# Patient Record
Sex: Female | Born: 1945
Health system: Southern US, Community
[De-identification: ages and names within clinical notes are randomized; demographics above are authoritative.]

## PROBLEM LIST (undated history)

## (undated) DIAGNOSIS — J45909 Unspecified asthma, uncomplicated: Secondary | ICD-10-CM

## (undated) DIAGNOSIS — I21A1 Myocardial infarction type 2: Secondary | ICD-10-CM

## (undated) DIAGNOSIS — I2510 Atherosclerotic heart disease of native coronary artery without angina pectoris: Secondary | ICD-10-CM

## (undated) DIAGNOSIS — I4719 Other supraventricular tachycardia: Secondary | ICD-10-CM

## (undated) DIAGNOSIS — I455 Other specified heart block: Secondary | ICD-10-CM

## (undated) DIAGNOSIS — I493 Ventricular premature depolarization: Secondary | ICD-10-CM

## (undated) DIAGNOSIS — E785 Hyperlipidemia, unspecified: Secondary | ICD-10-CM

## (undated) DIAGNOSIS — I10 Essential (primary) hypertension: Secondary | ICD-10-CM

## (undated) DIAGNOSIS — I272 Pulmonary hypertension, unspecified: Secondary | ICD-10-CM

## (undated) HISTORY — DX: Ventricular premature depolarization: I49.3

## (undated) HISTORY — PX: ELBOW FRACTURE SURGERY: SHX616

## (undated) HISTORY — DX: Other specified heart block: I45.5

## (undated) HISTORY — DX: Other supraventricular tachycardia: I47.19

## (undated) HISTORY — PX: KNEE ARTHROSCOPY: SUR90

## (undated) HISTORY — DX: Essential (primary) hypertension: I10

## (undated) HISTORY — DX: Pulmonary hypertension, unspecified: I27.20

## (undated) HISTORY — DX: Atherosclerotic heart disease of native coronary artery without angina pectoris: I25.10

## (undated) HISTORY — DX: Myocardial infarction type 2: I21.A1

## (undated) HISTORY — DX: Hyperlipidemia, unspecified: E78.5

---

## 1986-11-28 HISTORY — PX: ABDOMINAL HYSTERECTOMY: SHX81

## 2002-01-28 ENCOUNTER — Encounter: Payer: Self-pay | Admitting: Family Medicine

## 2002-01-28 ENCOUNTER — Ambulatory Visit (HOSPITAL_COMMUNITY): Admission: RE | Admit: 2002-01-28 | Discharge: 2002-01-28 | Payer: Self-pay | Admitting: Family Medicine

## 2002-02-27 ENCOUNTER — Encounter: Payer: Self-pay | Admitting: Family Medicine

## 2002-02-27 ENCOUNTER — Ambulatory Visit (HOSPITAL_COMMUNITY): Admission: RE | Admit: 2002-02-27 | Discharge: 2002-02-27 | Payer: Self-pay | Admitting: Family Medicine

## 2002-07-15 ENCOUNTER — Ambulatory Visit (HOSPITAL_COMMUNITY): Admission: RE | Admit: 2002-07-15 | Discharge: 2002-07-15 | Payer: Self-pay | Admitting: Family Medicine

## 2002-07-15 ENCOUNTER — Encounter: Payer: Self-pay | Admitting: Family Medicine

## 2002-09-09 ENCOUNTER — Ambulatory Visit (HOSPITAL_COMMUNITY): Admission: RE | Admit: 2002-09-09 | Discharge: 2002-09-09 | Payer: Self-pay | Admitting: Urology

## 2002-09-09 ENCOUNTER — Encounter: Payer: Self-pay | Admitting: Urology

## 2002-10-25 ENCOUNTER — Encounter: Payer: Self-pay | Admitting: Family Medicine

## 2002-10-25 ENCOUNTER — Ambulatory Visit (HOSPITAL_COMMUNITY): Admission: RE | Admit: 2002-10-25 | Discharge: 2002-10-25 | Payer: Self-pay | Admitting: Family Medicine

## 2003-02-28 ENCOUNTER — Encounter: Payer: Self-pay | Admitting: Family Medicine

## 2003-02-28 ENCOUNTER — Ambulatory Visit (HOSPITAL_COMMUNITY): Admission: RE | Admit: 2003-02-28 | Discharge: 2003-02-28 | Payer: Self-pay | Admitting: Family Medicine

## 2003-08-22 ENCOUNTER — Encounter: Payer: Self-pay | Admitting: Orthopaedic Surgery

## 2003-08-22 ENCOUNTER — Ambulatory Visit (HOSPITAL_COMMUNITY): Admission: RE | Admit: 2003-08-22 | Discharge: 2003-08-22 | Payer: Self-pay | Admitting: Orthopaedic Surgery

## 2004-03-01 ENCOUNTER — Ambulatory Visit (HOSPITAL_COMMUNITY): Admission: RE | Admit: 2004-03-01 | Discharge: 2004-03-01 | Payer: Self-pay | Admitting: Family Medicine

## 2005-03-14 ENCOUNTER — Ambulatory Visit (HOSPITAL_COMMUNITY): Admission: RE | Admit: 2005-03-14 | Discharge: 2005-03-14 | Payer: Self-pay | Admitting: Family Medicine

## 2005-03-24 ENCOUNTER — Ambulatory Visit (HOSPITAL_COMMUNITY): Admission: RE | Admit: 2005-03-24 | Discharge: 2005-03-24 | Payer: Self-pay | Admitting: Family Medicine

## 2005-08-09 ENCOUNTER — Ambulatory Visit (HOSPITAL_COMMUNITY): Admission: RE | Admit: 2005-08-09 | Discharge: 2005-08-09 | Payer: Self-pay | Admitting: Family Medicine

## 2006-03-27 ENCOUNTER — Ambulatory Visit (HOSPITAL_COMMUNITY): Admission: RE | Admit: 2006-03-27 | Discharge: 2006-03-27 | Payer: Self-pay | Admitting: Family Medicine

## 2006-04-26 ENCOUNTER — Ambulatory Visit: Payer: Self-pay | Admitting: Internal Medicine

## 2006-04-26 ENCOUNTER — Ambulatory Visit (HOSPITAL_COMMUNITY): Admission: RE | Admit: 2006-04-26 | Discharge: 2006-04-26 | Payer: Self-pay | Admitting: Internal Medicine

## 2006-06-29 ENCOUNTER — Ambulatory Visit (HOSPITAL_COMMUNITY): Admission: RE | Admit: 2006-06-29 | Discharge: 2006-06-29 | Payer: Self-pay | Admitting: Internal Medicine

## 2007-04-09 ENCOUNTER — Ambulatory Visit (HOSPITAL_COMMUNITY): Admission: RE | Admit: 2007-04-09 | Discharge: 2007-04-09 | Payer: Self-pay | Admitting: Internal Medicine

## 2008-04-15 ENCOUNTER — Ambulatory Visit (HOSPITAL_COMMUNITY): Admission: RE | Admit: 2008-04-15 | Discharge: 2008-04-15 | Payer: Self-pay | Admitting: Internal Medicine

## 2008-05-05 ENCOUNTER — Ambulatory Visit (HOSPITAL_COMMUNITY): Admission: RE | Admit: 2008-05-05 | Discharge: 2008-05-05 | Payer: Self-pay | Admitting: Internal Medicine

## 2008-07-21 ENCOUNTER — Encounter (HOSPITAL_COMMUNITY): Admission: RE | Admit: 2008-07-21 | Discharge: 2008-08-20 | Payer: Self-pay | Admitting: Oncology

## 2008-07-21 ENCOUNTER — Ambulatory Visit (HOSPITAL_COMMUNITY): Payer: Self-pay | Admitting: Internal Medicine

## 2009-09-29 ENCOUNTER — Encounter (HOSPITAL_COMMUNITY): Admission: RE | Admit: 2009-09-29 | Discharge: 2009-10-29 | Payer: Self-pay | Admitting: Internal Medicine

## 2009-09-29 ENCOUNTER — Ambulatory Visit (HOSPITAL_COMMUNITY): Payer: Self-pay | Admitting: Internal Medicine

## 2010-05-10 ENCOUNTER — Ambulatory Visit (HOSPITAL_COMMUNITY): Admission: RE | Admit: 2010-05-10 | Discharge: 2010-05-10 | Payer: Self-pay | Admitting: Internal Medicine

## 2010-06-22 ENCOUNTER — Ambulatory Visit (HOSPITAL_COMMUNITY): Admission: RE | Admit: 2010-06-22 | Discharge: 2010-06-22 | Payer: Self-pay | Admitting: Internal Medicine

## 2010-10-07 ENCOUNTER — Encounter (HOSPITAL_COMMUNITY)
Admission: RE | Admit: 2010-10-07 | Discharge: 2010-11-06 | Payer: Self-pay | Source: Home / Self Care | Attending: Internal Medicine | Admitting: Internal Medicine

## 2010-10-07 ENCOUNTER — Ambulatory Visit (HOSPITAL_COMMUNITY): Payer: Self-pay | Admitting: Internal Medicine

## 2011-03-07 ENCOUNTER — Ambulatory Visit (INDEPENDENT_AMBULATORY_CARE_PROVIDER_SITE_OTHER): Payer: Self-pay | Admitting: Internal Medicine

## 2011-04-15 NOTE — Op Note (Signed)
NAMEHIILANI, Tammy [name]                ACCOUNT NO.:  [id number]   MEDICAL RECORD NO.:  [id number]          PATIENT TYPE:  AMB   LOCATION:  DAY                           FACILITY:  [facility]   PHYSICIAN:  Lionel December, M.D.    DATE OF BIRTH:  [birth year]   DATE OF PROCEDURE:  04/26/2006  DATE OF DISCHARGE:                                 OPERATIVE REPORT   PROCEDURE:  Colonoscopy.   INDICATIONS:  Tammy [name] is a 65 year old African-American female who is  undergoing diagnostic colonoscopy.  She has intermittent hematochezia and  bloating.  She also has family history of colon carcinoma (maternal  grandmother in her late 83s or 27s).  Her last colonoscopy was in 2000.  Procedure risks were reviewed with the patient, and informed consent was  obtained.   MEDS FOR CONSCIOUS SEDATION:  Demerol 50 mg IV, Versed 8 mg IV.   FINDINGS:  Procedure performed in endoscopy suite.  The patient's vital  signs and O2 sat were monitored during procedure and remained stable.  The  patient was placed in the left lateral position.  Rectal examination  performed.  No abnormality noted on external or digital exam.  Olympus video-  scope was placed in the rectum and advanced under vision into the sigmoid  colon, a very tortuous sigmoid colon with a tight loops.  Slowly and  carefully scope was passed proximally and finally into the descending colon  and splenic flexure.  Further intubation to cecum was easy.  She had few  scattered diverticulum in the sigmoid and descending colon.  Overall  preparation was satisfactory.  Scope was passed through cecum which was  identified by a lipomatous ileocecal valve and appendiceal orifice/stump.  Pictures were taken for the record.  As the scope was withdrawn, colonic  mucosa was once again carefully examined.  There were no polyps or tumor  masses.  Rectal mucosa was normal.  Scope was retroflexed to examine  anorectal junction, and small hemorrhoids were noted below the dentate  line.  Endoscope was straightened and withdrawn.  The patient tolerated the  procedure well.   FINAL DIAGNOSIS:  1.  Few scattered diverticula at sigmoid and descending.  2.  External hemorrhoids felt to be source of intermittent hematochezia.   RECOMMENDATIONS:  1.  High-fiber diet.  2.  Yearly Hemoccults.  3.  She may consider next exam in 10 years from now.      Lionel December, M.D.  Electronically Signed     NR/MEDQ  D:  04/26/2006  T:  04/26/2006  Job:  [id number]   cc:   Angus G. Renard Matter, MD  Fax: [phone]

## 2011-07-04 ENCOUNTER — Other Ambulatory Visit (HOSPITAL_COMMUNITY): Payer: Self-pay | Admitting: Internal Medicine

## 2011-07-04 DIAGNOSIS — Z139 Encounter for screening, unspecified: Secondary | ICD-10-CM

## 2011-07-12 ENCOUNTER — Ambulatory Visit (HOSPITAL_COMMUNITY)
Admission: RE | Admit: 2011-07-12 | Discharge: 2011-07-12 | Disposition: A | Discharge status: Home / Self Care | Payer: Medicare Other | Source: Ambulatory Visit | Attending: Internal Medicine | Admitting: Internal Medicine

## 2011-07-12 DIAGNOSIS — Z1231 Encounter for screening mammogram for malignant neoplasm of breast: Secondary | ICD-10-CM | POA: Insufficient documentation

## 2011-07-12 DIAGNOSIS — Z139 Encounter for screening, unspecified: Secondary | ICD-10-CM

## 2012-03-22 DIAGNOSIS — I10 Essential (primary) hypertension: Secondary | ICD-10-CM | POA: Diagnosis not present

## 2012-05-01 DIAGNOSIS — I10 Essential (primary) hypertension: Secondary | ICD-10-CM | POA: Diagnosis not present

## 2012-07-23 ENCOUNTER — Other Ambulatory Visit (HOSPITAL_COMMUNITY): Payer: Self-pay | Admitting: Internal Medicine

## 2012-07-23 DIAGNOSIS — IMO0001 Reserved for inherently not codable concepts without codable children: Secondary | ICD-10-CM

## 2012-07-26 ENCOUNTER — Ambulatory Visit (HOSPITAL_COMMUNITY)
Admission: RE | Admit: 2012-07-26 | Discharge: 2012-07-26 | Disposition: A | Discharge status: Home / Self Care | Payer: Medicare Other | Source: Ambulatory Visit | Attending: Internal Medicine | Admitting: Internal Medicine

## 2012-07-26 DIAGNOSIS — Z1231 Encounter for screening mammogram for malignant neoplasm of breast: Secondary | ICD-10-CM | POA: Diagnosis not present

## 2012-07-26 DIAGNOSIS — IMO0001 Reserved for inherently not codable concepts without codable children: Secondary | ICD-10-CM

## 2012-08-03 DIAGNOSIS — M810 Age-related osteoporosis without current pathological fracture: Secondary | ICD-10-CM | POA: Diagnosis not present

## 2012-08-03 DIAGNOSIS — E785 Hyperlipidemia, unspecified: Secondary | ICD-10-CM | POA: Diagnosis not present

## 2012-08-03 DIAGNOSIS — J069 Acute upper respiratory infection, unspecified: Secondary | ICD-10-CM | POA: Diagnosis not present

## 2012-08-03 DIAGNOSIS — I10 Essential (primary) hypertension: Secondary | ICD-10-CM | POA: Diagnosis not present

## 2012-08-03 DIAGNOSIS — E782 Mixed hyperlipidemia: Secondary | ICD-10-CM | POA: Diagnosis not present

## 2012-08-13 ENCOUNTER — Other Ambulatory Visit (HOSPITAL_COMMUNITY): Payer: Self-pay | Admitting: Internal Medicine

## 2012-08-13 DIAGNOSIS — M810 Age-related osteoporosis without current pathological fracture: Secondary | ICD-10-CM

## 2012-08-17 ENCOUNTER — Ambulatory Visit (HOSPITAL_COMMUNITY)
Admission: RE | Admit: 2012-08-17 | Discharge: 2012-08-17 | Disposition: A | Discharge status: Home / Self Care | Payer: Medicare Other | Source: Ambulatory Visit | Attending: Internal Medicine | Admitting: Internal Medicine

## 2012-08-17 DIAGNOSIS — M818 Other osteoporosis without current pathological fracture: Secondary | ICD-10-CM | POA: Insufficient documentation

## 2012-08-17 DIAGNOSIS — M810 Age-related osteoporosis without current pathological fracture: Secondary | ICD-10-CM

## 2012-08-17 DIAGNOSIS — Z13820 Encounter for screening for osteoporosis: Secondary | ICD-10-CM | POA: Diagnosis not present

## 2012-08-28 DIAGNOSIS — Z23 Encounter for immunization: Secondary | ICD-10-CM | POA: Diagnosis not present

## 2012-10-22 DIAGNOSIS — H524 Presbyopia: Secondary | ICD-10-CM | POA: Diagnosis not present

## 2012-10-22 DIAGNOSIS — H40059 Ocular hypertension, unspecified eye: Secondary | ICD-10-CM | POA: Diagnosis not present

## 2012-10-22 DIAGNOSIS — H52229 Regular astigmatism, unspecified eye: Secondary | ICD-10-CM | POA: Diagnosis not present

## 2012-10-22 DIAGNOSIS — H5200 Hypermetropia, unspecified eye: Secondary | ICD-10-CM | POA: Diagnosis not present

## 2013-01-16 DIAGNOSIS — M729 Fibroblastic disorder, unspecified: Secondary | ICD-10-CM | POA: Diagnosis not present

## 2013-03-04 DIAGNOSIS — I10 Essential (primary) hypertension: Secondary | ICD-10-CM | POA: Diagnosis not present

## 2013-03-04 DIAGNOSIS — E785 Hyperlipidemia, unspecified: Secondary | ICD-10-CM | POA: Diagnosis not present

## 2013-03-04 DIAGNOSIS — E782 Mixed hyperlipidemia: Secondary | ICD-10-CM | POA: Diagnosis not present

## 2013-04-16 DIAGNOSIS — M25569 Pain in unspecified knee: Secondary | ICD-10-CM | POA: Diagnosis not present

## 2013-06-03 DIAGNOSIS — M549 Dorsalgia, unspecified: Secondary | ICD-10-CM | POA: Diagnosis not present

## 2013-06-03 DIAGNOSIS — F411 Generalized anxiety disorder: Secondary | ICD-10-CM | POA: Diagnosis not present

## 2013-07-18 DIAGNOSIS — S61209A Unspecified open wound of unspecified finger without damage to nail, initial encounter: Secondary | ICD-10-CM | POA: Diagnosis not present

## 2013-07-31 DIAGNOSIS — Z23 Encounter for immunization: Secondary | ICD-10-CM | POA: Diagnosis not present

## 2013-07-31 DIAGNOSIS — G589 Mononeuropathy, unspecified: Secondary | ICD-10-CM | POA: Diagnosis not present

## 2013-08-29 DIAGNOSIS — I10 Essential (primary) hypertension: Secondary | ICD-10-CM | POA: Diagnosis not present

## 2013-08-29 DIAGNOSIS — E782 Mixed hyperlipidemia: Secondary | ICD-10-CM | POA: Diagnosis not present

## 2013-09-03 ENCOUNTER — Other Ambulatory Visit (HOSPITAL_COMMUNITY): Payer: Self-pay | Admitting: Internal Medicine

## 2013-09-03 DIAGNOSIS — M79609 Pain in unspecified limb: Secondary | ICD-10-CM | POA: Diagnosis not present

## 2013-09-03 DIAGNOSIS — M79604 Pain in right leg: Secondary | ICD-10-CM

## 2013-09-03 DIAGNOSIS — G589 Mononeuropathy, unspecified: Secondary | ICD-10-CM | POA: Diagnosis not present

## 2013-09-03 DIAGNOSIS — E782 Mixed hyperlipidemia: Secondary | ICD-10-CM | POA: Diagnosis not present

## 2013-09-03 DIAGNOSIS — I10 Essential (primary) hypertension: Secondary | ICD-10-CM | POA: Diagnosis not present

## 2013-09-03 DIAGNOSIS — R200 Anesthesia of skin: Secondary | ICD-10-CM

## 2013-09-05 ENCOUNTER — Ambulatory Visit (HOSPITAL_COMMUNITY)
Admission: RE | Admit: 2013-09-05 | Discharge: 2013-09-05 | Disposition: A | Discharge status: Home / Self Care | Payer: Medicare Other | Source: Ambulatory Visit | Attending: Internal Medicine | Admitting: Internal Medicine

## 2013-09-05 ENCOUNTER — Encounter (HOSPITAL_COMMUNITY): Payer: Self-pay

## 2013-09-05 DIAGNOSIS — M51379 Other intervertebral disc degeneration, lumbosacral region without mention of lumbar back pain or lower extremity pain: Secondary | ICD-10-CM | POA: Insufficient documentation

## 2013-09-05 DIAGNOSIS — M47817 Spondylosis without myelopathy or radiculopathy, lumbosacral region: Secondary | ICD-10-CM | POA: Diagnosis not present

## 2013-09-05 DIAGNOSIS — M5126 Other intervertebral disc displacement, lumbar region: Secondary | ICD-10-CM | POA: Diagnosis not present

## 2013-09-05 DIAGNOSIS — M79604 Pain in right leg: Secondary | ICD-10-CM

## 2013-09-05 DIAGNOSIS — M129 Arthropathy, unspecified: Secondary | ICD-10-CM | POA: Insufficient documentation

## 2013-09-05 DIAGNOSIS — R209 Unspecified disturbances of skin sensation: Secondary | ICD-10-CM | POA: Diagnosis not present

## 2013-09-05 DIAGNOSIS — M4310 Spondylolisthesis, site unspecified: Secondary | ICD-10-CM | POA: Diagnosis not present

## 2013-09-05 DIAGNOSIS — M5137 Other intervertebral disc degeneration, lumbosacral region: Secondary | ICD-10-CM | POA: Insufficient documentation

## 2013-09-05 DIAGNOSIS — R200 Anesthesia of skin: Secondary | ICD-10-CM

## 2013-09-11 ENCOUNTER — Other Ambulatory Visit (HOSPITAL_COMMUNITY): Payer: Self-pay | Admitting: Internal Medicine

## 2013-09-11 DIAGNOSIS — Z139 Encounter for screening, unspecified: Secondary | ICD-10-CM

## 2013-09-19 ENCOUNTER — Ambulatory Visit (HOSPITAL_COMMUNITY): Payer: Medicare Other

## 2013-09-19 ENCOUNTER — Ambulatory Visit (HOSPITAL_COMMUNITY)
Admission: RE | Admit: 2013-09-19 | Discharge: 2013-09-19 | Disposition: A | Discharge status: Home / Self Care | Payer: Medicare Other | Source: Ambulatory Visit | Attending: Internal Medicine | Admitting: Internal Medicine

## 2013-09-19 DIAGNOSIS — Z139 Encounter for screening, unspecified: Secondary | ICD-10-CM

## 2013-09-19 DIAGNOSIS — Z1231 Encounter for screening mammogram for malignant neoplasm of breast: Secondary | ICD-10-CM | POA: Insufficient documentation

## 2013-09-24 ENCOUNTER — Other Ambulatory Visit: Payer: Self-pay | Admitting: Internal Medicine

## 2013-09-24 DIAGNOSIS — R928 Other abnormal and inconclusive findings on diagnostic imaging of breast: Secondary | ICD-10-CM

## 2013-10-16 ENCOUNTER — Ambulatory Visit (HOSPITAL_COMMUNITY)
Admission: RE | Admit: 2013-10-16 | Discharge: 2013-10-16 | Disposition: A | Discharge status: Home / Self Care | Payer: Medicare Other | Source: Ambulatory Visit | Attending: Internal Medicine | Admitting: Internal Medicine

## 2013-10-16 ENCOUNTER — Other Ambulatory Visit: Payer: Self-pay | Admitting: Internal Medicine

## 2013-10-16 DIAGNOSIS — R928 Other abnormal and inconclusive findings on diagnostic imaging of breast: Secondary | ICD-10-CM

## 2013-10-16 DIAGNOSIS — N63 Unspecified lump in unspecified breast: Secondary | ICD-10-CM | POA: Diagnosis not present

## 2014-03-06 DIAGNOSIS — R7301 Impaired fasting glucose: Secondary | ICD-10-CM | POA: Diagnosis not present

## 2014-03-06 DIAGNOSIS — I10 Essential (primary) hypertension: Secondary | ICD-10-CM | POA: Diagnosis not present

## 2014-03-10 ENCOUNTER — Other Ambulatory Visit (HOSPITAL_COMMUNITY): Payer: Self-pay | Admitting: Internal Medicine

## 2014-03-10 DIAGNOSIS — R928 Other abnormal and inconclusive findings on diagnostic imaging of breast: Secondary | ICD-10-CM

## 2014-03-11 DIAGNOSIS — I10 Essential (primary) hypertension: Secondary | ICD-10-CM | POA: Diagnosis not present

## 2014-03-11 DIAGNOSIS — E782 Mixed hyperlipidemia: Secondary | ICD-10-CM | POA: Diagnosis not present

## 2014-03-11 DIAGNOSIS — G589 Mononeuropathy, unspecified: Secondary | ICD-10-CM | POA: Diagnosis not present

## 2014-03-11 DIAGNOSIS — R7301 Impaired fasting glucose: Secondary | ICD-10-CM | POA: Diagnosis not present

## 2014-04-16 ENCOUNTER — Ambulatory Visit (HOSPITAL_COMMUNITY): Payer: Medicare Other

## 2014-04-23 ENCOUNTER — Ambulatory Visit (HOSPITAL_COMMUNITY): Payer: Medicare Other

## 2014-04-30 ENCOUNTER — Ambulatory Visit (HOSPITAL_COMMUNITY)
Admission: RE | Admit: 2014-04-30 | Discharge: 2014-04-30 | Disposition: A | Discharge status: Home / Self Care | Payer: Medicare Other | Source: Ambulatory Visit | Attending: Internal Medicine | Admitting: Internal Medicine

## 2014-04-30 DIAGNOSIS — R928 Other abnormal and inconclusive findings on diagnostic imaging of breast: Secondary | ICD-10-CM | POA: Diagnosis not present

## 2014-04-30 DIAGNOSIS — N63 Unspecified lump in unspecified breast: Secondary | ICD-10-CM | POA: Insufficient documentation

## 2014-06-18 DIAGNOSIS — H2510 Age-related nuclear cataract, unspecified eye: Secondary | ICD-10-CM | POA: Diagnosis not present

## 2014-06-18 DIAGNOSIS — H52229 Regular astigmatism, unspecified eye: Secondary | ICD-10-CM | POA: Diagnosis not present

## 2014-06-18 DIAGNOSIS — H40009 Preglaucoma, unspecified, unspecified eye: Secondary | ICD-10-CM | POA: Diagnosis not present

## 2014-06-18 DIAGNOSIS — H5200 Hypermetropia, unspecified eye: Secondary | ICD-10-CM | POA: Diagnosis not present

## 2014-07-30 DIAGNOSIS — H40009 Preglaucoma, unspecified, unspecified eye: Secondary | ICD-10-CM | POA: Diagnosis not present

## 2014-08-19 DIAGNOSIS — Z23 Encounter for immunization: Secondary | ICD-10-CM | POA: Diagnosis not present

## 2014-09-03 DIAGNOSIS — Z0000 Encounter for general adult medical examination without abnormal findings: Secondary | ICD-10-CM | POA: Diagnosis not present

## 2014-09-03 DIAGNOSIS — Z23 Encounter for immunization: Secondary | ICD-10-CM | POA: Diagnosis not present

## 2014-10-08 ENCOUNTER — Other Ambulatory Visit (HOSPITAL_COMMUNITY): Payer: Self-pay | Admitting: Internal Medicine

## 2014-10-08 DIAGNOSIS — N6310 Unspecified lump in the right breast, unspecified quadrant: Secondary | ICD-10-CM

## 2014-11-04 ENCOUNTER — Encounter (HOSPITAL_COMMUNITY): Payer: Medicare Other

## 2014-11-27 DIAGNOSIS — R21 Rash and other nonspecific skin eruption: Secondary | ICD-10-CM | POA: Diagnosis not present

## 2014-12-27 DIAGNOSIS — Z0000 Encounter for general adult medical examination without abnormal findings: Secondary | ICD-10-CM | POA: Diagnosis not present

## 2014-12-27 DIAGNOSIS — R21 Rash and other nonspecific skin eruption: Secondary | ICD-10-CM | POA: Diagnosis not present

## 2014-12-27 DIAGNOSIS — G4700 Insomnia, unspecified: Secondary | ICD-10-CM | POA: Diagnosis not present

## 2015-01-08 DIAGNOSIS — L500 Allergic urticaria: Secondary | ICD-10-CM | POA: Diagnosis not present

## 2015-01-08 DIAGNOSIS — L309 Dermatitis, unspecified: Secondary | ICD-10-CM | POA: Diagnosis not present

## 2015-01-15 DIAGNOSIS — L309 Dermatitis, unspecified: Secondary | ICD-10-CM | POA: Diagnosis not present

## 2015-02-06 DIAGNOSIS — Z6829 Body mass index (BMI) 29.0-29.9, adult: Secondary | ICD-10-CM | POA: Diagnosis not present

## 2015-02-06 DIAGNOSIS — Z0000 Encounter for general adult medical examination without abnormal findings: Secondary | ICD-10-CM | POA: Diagnosis not present

## 2015-02-09 DIAGNOSIS — L309 Dermatitis, unspecified: Secondary | ICD-10-CM | POA: Diagnosis not present

## 2015-05-26 DIAGNOSIS — R42 Dizziness and giddiness: Secondary | ICD-10-CM | POA: Diagnosis not present

## 2015-05-27 DIAGNOSIS — R7301 Impaired fasting glucose: Secondary | ICD-10-CM | POA: Diagnosis not present

## 2015-05-27 DIAGNOSIS — E785 Hyperlipidemia, unspecified: Secondary | ICD-10-CM | POA: Diagnosis not present

## 2015-05-27 DIAGNOSIS — I10 Essential (primary) hypertension: Secondary | ICD-10-CM | POA: Diagnosis not present

## 2015-05-27 DIAGNOSIS — R5383 Other fatigue: Secondary | ICD-10-CM | POA: Diagnosis not present

## 2015-05-27 DIAGNOSIS — R42 Dizziness and giddiness: Secondary | ICD-10-CM | POA: Diagnosis not present

## 2015-06-05 DIAGNOSIS — J309 Allergic rhinitis, unspecified: Secondary | ICD-10-CM | POA: Diagnosis not present

## 2015-06-05 DIAGNOSIS — I10 Essential (primary) hypertension: Secondary | ICD-10-CM | POA: Diagnosis not present

## 2015-06-05 DIAGNOSIS — E785 Hyperlipidemia, unspecified: Secondary | ICD-10-CM | POA: Diagnosis not present

## 2015-06-05 DIAGNOSIS — R7301 Impaired fasting glucose: Secondary | ICD-10-CM | POA: Diagnosis not present

## 2015-09-08 DIAGNOSIS — I10 Essential (primary) hypertension: Secondary | ICD-10-CM | POA: Diagnosis not present

## 2015-09-08 DIAGNOSIS — E785 Hyperlipidemia, unspecified: Secondary | ICD-10-CM | POA: Diagnosis not present

## 2015-09-08 DIAGNOSIS — R7301 Impaired fasting glucose: Secondary | ICD-10-CM | POA: Diagnosis not present

## 2015-09-15 ENCOUNTER — Other Ambulatory Visit (HOSPITAL_COMMUNITY): Payer: Self-pay | Admitting: Internal Medicine

## 2015-09-15 DIAGNOSIS — R7301 Impaired fasting glucose: Secondary | ICD-10-CM | POA: Diagnosis not present

## 2015-09-15 DIAGNOSIS — E785 Hyperlipidemia, unspecified: Secondary | ICD-10-CM | POA: Diagnosis not present

## 2015-09-15 DIAGNOSIS — I10 Essential (primary) hypertension: Secondary | ICD-10-CM | POA: Diagnosis not present

## 2015-09-15 DIAGNOSIS — R946 Abnormal results of thyroid function studies: Secondary | ICD-10-CM | POA: Diagnosis not present

## 2015-09-15 DIAGNOSIS — Z23 Encounter for immunization: Secondary | ICD-10-CM | POA: Diagnosis not present

## 2015-09-15 DIAGNOSIS — N6310 Unspecified lump in the right breast, unspecified quadrant: Secondary | ICD-10-CM

## 2015-09-15 DIAGNOSIS — Z09 Encounter for follow-up examination after completed treatment for conditions other than malignant neoplasm: Secondary | ICD-10-CM

## 2015-09-29 ENCOUNTER — Ambulatory Visit (HOSPITAL_COMMUNITY)
Admission: RE | Admit: 2015-09-29 | Discharge: 2015-09-29 | Disposition: A | Discharge status: Home / Self Care | Payer: Medicare Other | Source: Ambulatory Visit | Attending: Internal Medicine | Admitting: Internal Medicine

## 2015-09-29 DIAGNOSIS — N6489 Other specified disorders of breast: Secondary | ICD-10-CM | POA: Diagnosis not present

## 2015-09-29 DIAGNOSIS — Z09 Encounter for follow-up examination after completed treatment for conditions other than malignant neoplasm: Secondary | ICD-10-CM

## 2015-09-29 DIAGNOSIS — N63 Unspecified lump in breast: Secondary | ICD-10-CM | POA: Insufficient documentation

## 2015-09-29 DIAGNOSIS — N6310 Unspecified lump in the right breast, unspecified quadrant: Secondary | ICD-10-CM

## 2015-12-22 DIAGNOSIS — R7301 Impaired fasting glucose: Secondary | ICD-10-CM | POA: Diagnosis not present

## 2015-12-22 DIAGNOSIS — E782 Mixed hyperlipidemia: Secondary | ICD-10-CM | POA: Diagnosis not present

## 2015-12-22 DIAGNOSIS — I10 Essential (primary) hypertension: Secondary | ICD-10-CM | POA: Diagnosis not present

## 2015-12-22 DIAGNOSIS — R946 Abnormal results of thyroid function studies: Secondary | ICD-10-CM | POA: Diagnosis not present

## 2015-12-25 DIAGNOSIS — E782 Mixed hyperlipidemia: Secondary | ICD-10-CM | POA: Diagnosis not present

## 2015-12-25 DIAGNOSIS — J309 Allergic rhinitis, unspecified: Secondary | ICD-10-CM | POA: Diagnosis not present

## 2015-12-25 DIAGNOSIS — I10 Essential (primary) hypertension: Secondary | ICD-10-CM | POA: Diagnosis not present

## 2015-12-25 DIAGNOSIS — R7301 Impaired fasting glucose: Secondary | ICD-10-CM | POA: Diagnosis not present

## 2016-02-04 DIAGNOSIS — S50861A Insect bite (nonvenomous) of right forearm, initial encounter: Secondary | ICD-10-CM | POA: Diagnosis not present

## 2016-04-12 ENCOUNTER — Encounter (INDEPENDENT_AMBULATORY_CARE_PROVIDER_SITE_OTHER): Payer: Self-pay | Admitting: *Deleted

## 2016-04-13 DIAGNOSIS — M25511 Pain in right shoulder: Secondary | ICD-10-CM | POA: Diagnosis not present

## 2016-04-19 ENCOUNTER — Ambulatory Visit (INDEPENDENT_AMBULATORY_CARE_PROVIDER_SITE_OTHER): Payer: Medicare Other

## 2016-04-19 ENCOUNTER — Ambulatory Visit (INDEPENDENT_AMBULATORY_CARE_PROVIDER_SITE_OTHER): Payer: Medicare Other | Admitting: Orthopaedic Surgery

## 2016-04-19 ENCOUNTER — Encounter: Payer: Self-pay | Admitting: Orthopaedic Surgery

## 2016-04-19 VITALS — BP 131/79 | HR 74 | Temp 97.5°F | Ht 65.0 in | Wt 185.0 lb

## 2016-04-19 DIAGNOSIS — M25511 Pain in right shoulder: Secondary | ICD-10-CM

## 2016-04-19 MED ORDER — HYDROCODONE-ACETAMINOPHEN 5-325 MG PO TABS: 1.0000 | ORAL_TABLET | ORAL | Status: DC | PRN

## 2016-04-19 NOTE — Progress Notes (Signed)
Subjective: My right shoulder hurts.    Patient ID: Tammy [name], female    DOB: [birth year], 70 y.o.   MRN: [id number]  Shoulder Pain  The pain is present in the right shoulder. This is a new problem. The current episode started more than 1 year ago. There has been no history of extremity trauma. The problem occurs daily. The problem has been gradually worsening. The quality of the pain is described as aching. The pain is at a severity of 4/10. The pain is moderate. The symptoms are aggravated by activity and cold. She has tried NSAIDS, rest, heat, cold and acetaminophen for the symptoms. The treatment provided mild relief.   She has a new grandson who lives in [place].  She went and stayed there for about three weeks.  She did a lot of lifting of the grandson and helping in the house.  She started to notice some pain in her right shoulder.  The pain got slowly worse.  She came back home and the pain continued and has gotten more painful.  She has pain in raising her right arm over her head.  She is not sleeping well.  She has no redness, no numbness, no neck pain, no other joint pain. She tried heat, ice, rubs and Advil with little help.  She saw Dr. Merlyn Albert and is referred here.   Review of Systems  HENT: Negative for congestion.   Respiratory: Negative for cough and shortness of breath.   Cardiovascular: Negative for chest pain and leg swelling.  Endocrine: Positive for cold intolerance.  Musculoskeletal: Positive for arthralgias.  Allergic/Immunologic: Positive for environmental allergies.   History reviewed. No pertinent past medical history.  History reviewed. No pertinent past surgical history.  No current outpatient prescriptions on file prior to visit.   No current facility-administered medications on file prior to visit.    Social History   Social History  . Marital Status: Widowed    Spouse Name: N/A  . Number of Children: N/A  . Years of Education: N/A    Occupational History  . Not on file.   Social History Main Topics  . Smoking status: Not on file  . Smokeless tobacco: Not on file  . Alcohol Use: Not on file  . Drug Use: Not on file  . Sexual Activity: Not on file   Other Topics Concern  . Not on file   Social History Narrative    BP 131/79 mmHg  Pulse 74  Temp(Src) 97.5 F (36.4 C)  Ht 5\' 5"  (1.651 m)  Wt 185 lb (83.915 kg)  BMI 30.79 kg/m2     Objective:   Physical Exam  Constitutional: She is oriented to person, place, and time. She appears well-developed and well-nourished.  HENT:  Head: Normocephalic and atraumatic.  Eyes: Conjunctivae and EOM are normal. Pupils are equal, round, and reactive to light.  Neck: Normal range of motion. Neck supple.  Cardiovascular: Normal rate, regular rhythm and intact distal pulses.   Pulmonary/Chest: Effort normal.  Abdominal: Soft.  Musculoskeletal: She exhibits tenderness (Pain of the right shoulder, adduction full, flexion 150, abduction 100, internal 20, external 25, extension 5, no effusion, no redness.  Left shoulder normal.).  Neurological: She is alert and oriented to person, place, and time. She displays normal reflexes. No cranial nerve deficit. She exhibits normal muscle tone. Coordination normal.  Skin: Skin is warm and dry.  Psychiatric: She has a normal mood and affect. Her behavior is normal. Judgment and  thought content normal.  Vitals reviewed.   X-rays were done of the right shoulder, reported separately.      Assessment & Plan:   Encounter Diagnosis  Name Primary?  . Right shoulder pain Yes   She has bursitis of the right shoulder.  PROCEDURE NOTE:  The patient request injection, verbal consent was obtained.  The right shoulder was prepped appropriately after time out was performed.   Sterile technique was observed and injection of 1 cc of Depo-Medrol 40 mg with several cc's of plain xylocaine. Anesthesia was provided by ethyl chloride and a  20-gauge needle was used to inject the shoulder area. A posterior approach was used.  The injection was tolerated well.  A band aid dressing was applied.  The patient was advised to apply ice later today and tomorrow to the injection sight as needed.  I have given pain medicine and told her to stop the Advil and begin Aleve one bid pc.  I will see her back in two weeks.  Call if any problem. Precautions given.

## 2016-04-19 NOTE — Patient Instructions (Signed)
Begin PT. 

## 2016-04-19 NOTE — Addendum Note (Signed)
Addended by: Moreen Fowler R on: 04/19/2016 05:04 PM   Modules accepted: Orders

## 2016-04-22 ENCOUNTER — Ambulatory Visit (HOSPITAL_COMMUNITY): Payer: Medicare Other | Attending: Orthopaedic Surgery | Admitting: Occupational Therapy

## 2016-04-22 DIAGNOSIS — R29898 Other symptoms and signs involving the musculoskeletal system: Secondary | ICD-10-CM | POA: Diagnosis not present

## 2016-04-22 DIAGNOSIS — M79601 Pain in right arm: Secondary | ICD-10-CM | POA: Diagnosis not present

## 2016-04-22 NOTE — Patient Instructions (Addendum)
Doorway Stretch  Place each hand opposite each other on the doorway. (You can change where you feel the stretch by moving arms higher or lower.) Step through with one foot and bend front knee until a stretch is felt and hold. Step through with the opposite foot on the next rep. Hold for _____ seconds. Repeat ____times.     Scapular Retraction (Standing)   With arms at sides, pinch shoulder blades together. Repeat ____ times per set. Do ____ sets per session. Do ____ sessions per day.  http://orth.exer.us/944   Copyright  VHI. All rights reserved.   Wall Flexion  Using a towel, slide your arm up the wall until a stretch is felt in your shoulder .

## 2016-04-22 NOTE — Therapy (Signed)
[facility] [address] [place], [place], [address] Phone: [phone]   Fax:  [phone]  Occupational Therapy Evaluation  Patient Details  Name: Tammy [name] MRN: [id number] Date of Birth: 20-Jan-1946 Referring Provider: Sanjuana Kava, MD  Encounter Date: 04/22/2016      OT End of Session - 04/22/16 1354    Visit Number 1   Number of Visits 17   Date for OT Re-Evaluation 06/17/16  mini reassessment 05/20/16   Authorization Type Medicare part A and B   OT Start Time [id number]   OT Stop Time 1350   OT Time Calculation (min) 45 min   Activity Tolerance Patient tolerated treatment well   Behavior During Therapy [facility] for tasks assessed/performed      No past medical history on file.  No past surgical history on file.  There were no vitals filed for this visit.      Subjective Assessment - 04/22/16 1315    Subjective  S: "I'm fair"   Pertinent History See epic for PMH   Special Tests FOTO: 31/100   Patient Stated Goals I want to get back to at least 95% and learn how to be able to get back to doing what I normally do   Currently in Pain? Yes   Pain Score 7    Pain Location Shoulder   Pain Orientation Right   Pain Descriptors / Indicators Aching   Pain Type Acute pain   Pain Radiating Towards Sometimes my hand feels tingly    Pain Onset More than a month ago   Pain Frequency Intermittent   Aggravating Factors  movement   Pain Relieving Factors rest   Effect of Pain on Daily Activities ADLs and IADLs   Multiple Pain Sites No           [facility] OT Assessment - 04/22/16 0001    Assessment   Diagnosis Right shoulder pain   Referring Provider Sanjuana Kava, MD   Onset Date 02/10/16   Prior Therapy none   Precautions   Precautions None   Restrictions   Weight Bearing Restrictions No   Balance Screen   Has the patient fallen in the past 6 months No   Home  Environment   Family/patient expects to be discharged to: Private  residence   Living Arrangements Alone   Prior Function   Level of Independence Independent   ADL   Eating/Feeding Independent   Grooming Independent   Lower Body Bathing Modified independent  increased time and mainly uses LUE   Upper Body Dressing Increased time  due to pain and weakness   Lower Body Dressing Modified independent  increased time and mainly using LUE   Toilet Tranfer Independent   Toileting - Clothing Manipulation Modified independent  mainly using LUE   Toileting -  Hygiene Modified Independent  mainly using LUE   Tub/Shower Transfer Modified independent  increased time, using grab bars   Written Expression   Dominant Hand Right   Vision - History   Baseline Vision Wears glasses all the time   Activity Tolerance   Activity Tolerance Tolerates 30 min activity with muliple rests   Cognition   Overall Cognitive Status Within Functional Limits for tasks assessed   Observation/Other Assessments   Observations WNL   Sensation   Light Touch Impaired by gross assessment   Hot/Cold Impaired by gross assessment   Proprioception Impaired by gross assessment   Coordination   Gross Motor Movements are Fluid  and Coordinated Yes   Fine Motor Movements are Fluid and Coordinated No   ROM / Strength   AROM / PROM / Strength AROM;PROM;Strength   AROM   Overall AROM  Within functional limits for tasks performed  tested in supine   Overall AROM Comments Pt with increased pain when completing AROM, but able to go to full range with RUE   PROM   Overall PROM  Within functional limits for tasks performed  tested in supine   Overall PROM Comments Pt with increased pain during PROM as well   Strength   Overall Strength Deficits   Strength Assessment Site Shoulder;Elbow;Forearm;Wrist   Right/Left Shoulder Right   Right Shoulder Flexion 3/5  increased pain   Right Shoulder ABduction 3/5  increased pain   Right Shoulder Internal Rotation 3+/5  little pain   Right  Shoulder External Rotation 3+/5  little pain   Right/Left Elbow Right   Right Elbow Flexion 4+/5   Right Elbow Extension 4+/5   Right/Left Forearm Right   Right Forearm Pronation 5/5   Right Forearm Supination 5/5   Right/Left Wrist Right   Right Wrist Flexion 5/5   Right Wrist Extension 5/5   Hand Function   Right Hand Gross Grasp Impaired   Right Hand Grip (lbs) 35   Left Hand Gross Grasp Functional   Left Hand Grip (lbs) 80   Functional Reaching Activities   Low Level mod difficulty   Mid Level min difficulty   High Level mod difficulty                         OT Education - 04/22/16 1353    Education provided Yes   Education Details RUE stretch HEP   Person(s) Educated Patient   Methods Explanation;Demonstration;Verbal cues;Handout   Comprehension Verbalized understanding;Need further instruction;Returned demonstration;Verbal cues required          OT Short Term Goals - 04/22/16 1404    OT SHORT TERM GOAL #1   Title Pt will be independent with initial RUE HEP (including fine and gross motor exercises)    Time 4   Period Weeks   Status New   OT SHORT TERM GOAL #2   Title Pt will increase gross grasp strength to 45lbs to increase overall functional strength and independence with ADLs    Baseline 35lbs   Time 4   Period Weeks   Status New   OT SHORT TERM GOAL #3   Title Pt's pain will be 5/10 or less in order to increase overall independence with self-care tasks and to increase patient's quality of life   Baseline 7/10 on eval   Time 4   Period Weeks   Status New   OT SHORT TERM GOAL #4   Title Patient's shoulder flexion strength will increase to 4/5 with little to no pain during testing    Baseline mod increased pain during testing; 3/5   Time 4   Period Weeks   Status New           OT Long Term Goals - 04/22/16 1408    OT LONG TERM GOAL #1   Title Patient will be independent with upgraded  HEP   Time 8   Period Weeks   Status  New   OT LONG TERM GOAL #2   Title Pt will increase gross grasp strength to at least 55lbs to increase her overall functional strength and independence during ADLs  Time 8   Period Weeks   Status New   OT LONG TERM GOAL #3   Title Patient's internal and external rotation strength will increase to 4+/5    Time 8   Period Weeks   Status New   OT LONG TERM GOAL #4   Title Patient's pain will be 2/10 or less in order to increase overall independence with self care tasks and increase patient's overall quality of life    Baseline 7/10 pain on eval    Time 8   Period Weeks   Status New   OT LONG TERM GOAL #5   Title Pt will be able to tolerate completing an IADL task for at least 20 minutes with no more than 2/10 pain and no rest breaks needed   Time 8   Period Weeks   Status New               Plan - 04/22/16 1356    Clinical Impression Statement A: Pt is a 70 yo female with diagnosis of right shoulder pain (M25.511); low complexity evaluation. Pt reports start of shoulder pain being in middle of March after playing with her grandson, although she is unsure this is the reason for the pain. Pt reports pain as being everyday and it impacts her quality of life and independence with ADLs and IADLs. Pt will benefit from skilled outpatient OT to focus on increasing functional strength, increasing overall independence & quality of life, and decreasing overall pain.    Rehab Potential Excellent   Clinical Impairments Affecting Rehab Potential none known at this time   OT Frequency 2x / week   OT Duration 8 weeks   OT Treatment/Interventions Self-care/ADL training;Moist Heat;Therapeutic exercise;Therapeutic exercises;Therapeutic activities;Patient/family education;Ultrasound;Manual Therapy;Passive range of motion   Plan P: Go over initial HEP and add to it. Start myofascial release, PROM, AAROM in supine.    OT Home Exercise Plan RUE AA stretch program    Recommended Other Services None at  this time   Consulted and Agree with Plan of Care Patient      Patient will benefit from skilled therapeutic intervention in order to improve the following deficits and impairments:  Decreased coordination, Decreased endurance, Decreased strength, Pain, Impaired UE functional use, Increased fascial restricitons, Impaired sensation, Impaired tone  Visit Diagnosis: Pain In Right Arm - Plan: Ot plan of care cert/re-cert  Other symptoms and signs involving the musculoskeletal system - Plan: Ot plan of care cert/re-cert      G-Codes - 0000000 1419    Functional Assessment Tool Used FOTO: 31/100   Functional Limitation Carrying, moving and handling objects   Carrying, Moving and Handling Objects Current Status [id number]) At least 60 percent but less than 80 percent impaired, limited or restricted   Carrying, Moving and Handling Objects Goal Status [id number]) At least 20 percent but less than 40 percent impaired, limited or restricted      Problem List There are no active problems to display for this patient.   Chrys Racer , MS, OTR/L, CLT  04/22/2016, 2:22 PM  [facility] [place], [place], [address] Phone: [phone]   Fax:  [phone]  Name: KIRBI SIRMON MRN: [id number] Date of Birth: [birth year]

## 2016-04-26 ENCOUNTER — Encounter (HOSPITAL_COMMUNITY): Payer: Medicare Other | Admitting: Occupational Therapy

## 2016-04-28 ENCOUNTER — Other Ambulatory Visit (INDEPENDENT_AMBULATORY_CARE_PROVIDER_SITE_OTHER): Payer: Self-pay | Admitting: *Deleted

## 2016-04-28 DIAGNOSIS — Z800 Family history of malignant neoplasm of digestive organs: Secondary | ICD-10-CM

## 2016-04-28 DIAGNOSIS — Z1211 Encounter for screening for malignant neoplasm of colon: Secondary | ICD-10-CM

## 2016-04-29 ENCOUNTER — Telehealth (HOSPITAL_COMMUNITY): Payer: Self-pay

## 2016-04-29 ENCOUNTER — Encounter (HOSPITAL_COMMUNITY): Payer: Medicare Other | Admitting: Occupational Therapy

## 2016-04-29 NOTE — Telephone Encounter (Signed)
Patient not feeling well and her power went out.

## 2016-05-03 ENCOUNTER — Encounter (HOSPITAL_COMMUNITY): Payer: Self-pay

## 2016-05-03 ENCOUNTER — Ambulatory Visit (INDEPENDENT_AMBULATORY_CARE_PROVIDER_SITE_OTHER): Payer: Medicare Other | Admitting: Orthopaedic Surgery

## 2016-05-03 ENCOUNTER — Encounter: Payer: Self-pay | Admitting: Orthopaedic Surgery

## 2016-05-03 ENCOUNTER — Ambulatory Visit (HOSPITAL_COMMUNITY): Payer: Medicare Other | Attending: Orthopaedic Surgery

## 2016-05-03 VITALS — BP 116/70 | HR 70 | Temp 97.7°F | Ht 63.0 in | Wt 184.8 lb

## 2016-05-03 DIAGNOSIS — M25611 Stiffness of right shoulder, not elsewhere classified: Secondary | ICD-10-CM | POA: Insufficient documentation

## 2016-05-03 DIAGNOSIS — R29898 Other symptoms and signs involving the musculoskeletal system: Secondary | ICD-10-CM | POA: Insufficient documentation

## 2016-05-03 DIAGNOSIS — M79601 Pain in right arm: Secondary | ICD-10-CM | POA: Diagnosis not present

## 2016-05-03 DIAGNOSIS — M25511 Pain in right shoulder: Secondary | ICD-10-CM

## 2016-05-03 NOTE — Therapy (Signed)
[name], [place], [address] Phone: [phone]   Fax:  [phone]  Occupational Therapy Treatment  Patient Details  Name: Tammy [name] MRN: [id number] Date of Birth: Mar 12, 1946 Referring Provider: Sanjuana Kava, MD  Encounter Date: 05/03/2016      OT End of Session - 05/03/16 1202    Visit Number 2   Number of Visits 17   Date for OT Re-Evaluation 06/17/16  mini reassessment 05/20/16   Authorization Type Medicare part A and B   Authorization Time Period before 10th visit   Authorization - Visit Number 2   Authorization - Number of Visits 10   OT Start Time 1129  Pt arrived late. Was at Dr. Brooke Bonito for appointment.   OT Stop Time 1205   OT Time Calculation (min) 36 min   Activity Tolerance Patient tolerated treatment well   Behavior During Therapy WFL for tasks assessed/performed      History reviewed. No pertinent past medical history.  No past surgical history on file.  There were no vitals filed for this visit.      Subjective Assessment - 05/03/16 1156    Subjective  S: I can now put a shirt on without crying out in pain.   Currently in Pain? Yes   Pain Score 8    Pain Location Shoulder   Pain Orientation Right   Pain Type Acute pain            [facility] OT Assessment - 05/03/16 1131    Assessment   Diagnosis Right shoulder pain   Precautions   Precautions None   Palpation   Palpation comment Max fascial restrictions in right upper arm, trapezius, and scapularis region.   AROM   Overall AROM Comments Assessed seated. IR/er adducted.   AROM Assessment Site Shoulder   Right/Left Shoulder Right   Right Shoulder Flexion 135 Degrees   Right Shoulder ABduction 125 Degrees   Right Shoulder Internal Rotation 83 Degrees   Right Shoulder External Rotation 84 Degrees                  OT Treatments/Exercises (OP) - 05/03/16 1151    Exercises   Exercises Shoulder   Shoulder  Exercises: Supine   Protraction PROM;AAROM;10 reps   Horizontal ABduction PROM;AAROM;10 reps   External Rotation PROM;10 reps   Internal Rotation PROM;10 reps   Flexion PROM;AAROM;10 reps   ABduction PROM;10 reps   Manual Therapy   Manual Therapy Myofascial release   Manual therapy comments Manual therapy completed prior to exercises.   Myofascial Release Myofascial release and manual stretching complete to right upper arm, trapezius, and scapularis region to decrease fascial restrictions and increase joint mobility in a pain free zone.                 OT Education - 05/03/16 1201    Education provided Yes   Education Details Patient was given OT evaluation. Goals and plan of care reviewed. Therapist will add A/ROM goal and fascial restrictions goal.   Person(s) Educated Patient   Methods Explanation;Handout   Comprehension Verbalized understanding          OT Short Term Goals - 05/03/16 1157    OT SHORT TERM GOAL #1   Title Pt will be independent with initial RUE HEP (including fine and gross motor exercises)    Time 4   Period Weeks   Status On-going   OT SHORT TERM GOAL #2  Title Pt will increase gross grasp strength to 45lbs to increase overall functional strength and independence with ADLs    Baseline 35lbs   Time 4   Period Weeks   Status On-going   OT SHORT TERM GOAL #3   Title Pt's pain will be 5/10 or less in order to increase overall independence with self-care tasks and to increase patient's quality of life   Baseline 7/10 on eval   Time 4   Period Weeks   Status On-going   OT SHORT TERM GOAL #4   Title Patient's shoulder flexion strength will increase to 4/5 with little to no pain during testing    Baseline mod increased pain during testing; 3/5   Time 4   Period Weeks   Status On-going   OT SHORT TERM GOAL #5   Title Patient will decrease fascial restrictions to mod amount in right UE to increase functional mobility needed to complete reaching  activities.    Time 3   Period Weeks   Status New           OT Long Term Goals - 05/03/16 1158    OT LONG TERM GOAL #1   Title Patient will be independent with upgraded  HEP   Time 8   Period Weeks   Status On-going   OT LONG TERM GOAL #2   Title Pt will increase gross grasp strength to at least 55lbs to increase her overall functional strength and independence during ADLs    Time 8   Period Weeks   Status On-going   OT LONG TERM GOAL #3   Title Patient's internal and external rotation strength will increase to 4+/5    Time 8   Period Weeks   Status On-going   OT LONG TERM GOAL #4   Title Patient's pain will be 2/10 or less in order to increase overall independence with self care tasks and increase patient's overall quality of life    Baseline 7/10 pain on eval    Time 8   Period Weeks   Status On-going   OT LONG TERM GOAL #5   Title Pt will be able to tolerate completing an IADL task for at least 20 minutes with no more than 2/10 pain and no rest breaks needed   Time 8   Period Weeks   Status On-going   Long Term Additional Goals   Additional Long Term Goals Yes   OT LONG TERM GOAL #6   Title Patient will increase A/ROM to WNL to increase ability to complete overhead tasks using RUE.    Time 7   Period Weeks   Status New   OT LONG TERM GOAL #7   Title Patient will decrease fascial restrictions to min amount to increase functional mobility needed to complete overhead tasks using RUE.    Time 7   Period Weeks   Status New               Plan - 05/03/16 1250    Clinical Impression Statement A: Initiated myofascial release, manual stretching, and AA/ROM exercises. Limited with session due to time constraint. Seated A/ROM was measured and goals were made regarding A/ROM and fascial restrictions. Patient require VC for all exercises for form and technique.    Plan P: Continue with AA/ROM supine and complete seated if able. Add wall wash.      Patient will  benefit from skilled therapeutic intervention in order to improve the following deficits and impairments:  Decreased coordination, Decreased endurance, Decreased strength, Pain, Impaired UE functional use, Increased fascial restricitons, Impaired sensation, Impaired tone  Visit Diagnosis: Other symptoms and signs involving the musculoskeletal system  Stiffness of right shoulder, not elsewhere classified  Pain In Right Arm    Problem List There are no active problems to display for this patient.   Ailene Ravel, OTR/L,CBIS  [phone]  05/03/2016, 12:54 PM  [facility] [address] [place], [place], [address] Phone: [phone]   Fax:  [phone]  Name: LEMIA LANFORD MRN: [id number] Date of Birth: 1946/07/09

## 2016-05-03 NOTE — Progress Notes (Signed)
CC:  My right shoulder is better but still hurts   She has been to OT and is making good progress.  I have reviewed her OT notes.  She is better.  She has no new trauma and no paresthesias.  ROM of the right shoulder is much better, forward 160, abduction 145, adduction 40, internal 35, external 35, extension 20.  NV intact.  Encounter Diagnosis  Name Primary?  . Right shoulder pain Yes    PROCEDURE NOTE:  The patient request injection, verbal consent was obtained.  The right shoulder was prepped appropriately after time out was performed.   Sterile technique was observed and injection of 1 cc of Depo-Medrol 40 mg with several cc's of plain xylocaine. Anesthesia was provided by ethyl chloride and a 20-gauge needle was used to inject the shoulder area. A posterior approach was used.  The injection was tolerated well.  A band aid dressing was applied.  The patient was advised to apply ice later today and tomorrow to the injection sight as needed.  I will see her in three weeks.  Call if any problem.  Electronically Signed Sanjuana Kava, MD 6/6/201711:19 AM

## 2016-05-06 ENCOUNTER — Encounter (HOSPITAL_COMMUNITY): Payer: Self-pay

## 2016-05-06 ENCOUNTER — Ambulatory Visit (HOSPITAL_COMMUNITY): Payer: Medicare Other

## 2016-05-06 DIAGNOSIS — M79601 Pain in right arm: Secondary | ICD-10-CM | POA: Diagnosis not present

## 2016-05-06 DIAGNOSIS — M25611 Stiffness of right shoulder, not elsewhere classified: Secondary | ICD-10-CM | POA: Diagnosis not present

## 2016-05-06 DIAGNOSIS — R29898 Other symptoms and signs involving the musculoskeletal system: Secondary | ICD-10-CM

## 2016-05-06 NOTE — Therapy (Signed)
[place], [place], [address] Phone: [phone]   Fax:  [phone]  Occupational Therapy Treatment  Patient Details  Name: Tammy [name] MRN: [id number] Date of Birth: January 18, 1946 Referring Provider: Sanjuana Kava, MD  Encounter Date: 05/06/2016      OT End of Session - 05/06/16 1057    Visit Number 3   Number of Visits 17   Date for OT Re-Evaluation 06/17/16  mini reassessment 05/20/16   Authorization Type Medicare part A and B   Authorization Time Period before 10th visit   Authorization - Visit Number 3   Authorization - Number of Visits 10   OT Start Time 0945   OT Stop Time 1030   OT Time Calculation (min) 45 min   Activity Tolerance Patient tolerated treatment well   Behavior During Therapy [facility] for tasks assessed/performed      History reviewed. No pertinent past medical history.  No past surgical history on file.  There were no vitals filed for this visit.      Subjective Assessment - 05/06/16 1010    Subjective  S: I can tell I"m getting better.   Currently in Pain? Yes   Pain Score 5    Pain Location Shoulder   Pain Orientation Right   Pain Descriptors / Indicators Aching   Pain Type Acute pain   Pain Radiating Towards N/A   Pain Onset More than a month ago   Pain Frequency Occasional   Aggravating Factors  Movement   Pain Relieving Factors Rest, heat, and pain meds.    Effect of Pain on Daily Activities ADL and IADL   Multiple Pain Sites No            [facility] OT Assessment - 05/06/16 1013    Assessment   Diagnosis Right shoulder pain   Precautions   Precautions None                  OT Treatments/Exercises (OP) - 05/06/16 1014    Exercises   Exercises Shoulder   Shoulder Exercises: Supine   Protraction PROM;AAROM;10 reps   Horizontal ABduction PROM;AAROM;10 reps   External Rotation PROM;AAROM;10 reps   Internal Rotation PROM;AAROM;10 reps   Flexion  PROM;AAROM;10 reps   ABduction PROM;AAROM;10 reps   Shoulder Exercises: Standing   Protraction AAROM;10 reps   Horizontal ABduction AAROM;10 reps   External Rotation AAROM;10 reps   Internal Rotation AAROM;10 reps   Flexion AAROM;10 reps   ABduction AAROM;10 reps   Manual Therapy   Manual Therapy Myofascial release   Manual therapy comments Manual therapy completed prior to exercises.   Myofascial Release Myofascial release and manual stretching complete to right upper arm, trapezius, and scapularis region to decrease fascial restrictions and increase joint mobility in a pain free zone.                   OT Short Term Goals - 05/06/16 1059    OT SHORT TERM GOAL #1   Title Pt will be independent with initial RUE HEP (including fine and gross motor exercises)    Time 4   Period Weeks   Status On-going   OT SHORT TERM GOAL #2   Title Pt will increase gross grasp strength to 45lbs to increase overall functional strength and independence with ADLs    Baseline 35lbs   Time 4   Period Weeks   Status On-going   OT SHORT TERM GOAL #3  Title Pt's pain will be 5/10 or less in order to increase overall independence with self-care tasks and to increase patient's quality of life   Baseline 7/10 on eval   Time 4   Period Weeks   Status On-going   OT SHORT TERM GOAL #4   Title Patient's shoulder flexion strength will increase to 4/5 with little to no pain during testing    Baseline mod increased pain during testing; 3/5   Time 4   Period Weeks   Status On-going   OT SHORT TERM GOAL #5   Title Patient will decrease fascial restrictions to mod amount in right UE to increase functional mobility needed to complete reaching activities.    Time 3   Period Weeks   Status On-going           OT Long Term Goals - 05/06/16 1059    OT LONG TERM GOAL #1   Title Patient will be independent with upgraded  HEP   Time 8   Period Weeks   Status On-going   OT LONG TERM GOAL #2    Title Pt will increase gross grasp strength to at least 55lbs to increase her overall functional strength and independence during ADLs    Time 8   Period Weeks   Status On-going   OT LONG TERM GOAL #3   Title Patient's internal and external rotation strength will increase to 4+/5    Time 8   Period Weeks   Status On-going   OT LONG TERM GOAL #4   Title Patient's pain will be 2/10 or less in order to increase overall independence with self care tasks and increase patient's overall quality of life    Baseline 7/10 pain on eval    Time 8   Period Weeks   Status On-going   OT LONG TERM GOAL #5   Title Pt will be able to tolerate completing an IADL task for at least 20 minutes with no more than 2/10 pain and no rest breaks needed   Time 8   Period Weeks   Status On-going   OT LONG TERM GOAL #6   Title Patient will increase A/ROM to WNL to increase ability to complete overhead tasks using RUE.    Time 7   Period Weeks   Status On-going   OT LONG TERM GOAL #7   Title Patient will decrease fascial restrictions to min amount to increase functional mobility needed to complete overhead tasks using RUE.    Time 7   Period Weeks   Status On-going               Plan - 05/06/16 1057    Clinical Impression Statement A: Pt was able to complete all supine and standing AA/ROM exercises although had complaints of pain/discomfort. VC for form and technique.   Plan P: Add proximal shoulder strengthening supine and pulleys and wall wash.      Patient will benefit from skilled therapeutic intervention in order to improve the following deficits and impairments:  Decreased coordination, Decreased endurance, Decreased strength, Pain, Impaired UE functional use, Increased fascial restricitons, Impaired sensation, Impaired tone  Visit Diagnosis: Other symptoms and signs involving the musculoskeletal system  Stiffness of right shoulder, not elsewhere classified  Pain In Right  Arm    Problem List There are no active problems to display for this patient.   Limmie PatriciaLaura Jomes Giraldo, OTR/L,CBIS  [phone]  05/06/2016, 11:00 AM  [place] [facility] [address] [place], [place], [address] Phone: [phone]   Fax:  [phone]  Name: Tammy [name] MRN: [id number] Date of Birth: December 31, 1945

## 2016-05-10 ENCOUNTER — Encounter (HOSPITAL_COMMUNITY): Payer: Self-pay

## 2016-05-10 ENCOUNTER — Ambulatory Visit (HOSPITAL_COMMUNITY): Payer: Medicare Other

## 2016-05-10 DIAGNOSIS — M25611 Stiffness of right shoulder, not elsewhere classified: Secondary | ICD-10-CM

## 2016-05-10 DIAGNOSIS — M79601 Pain in right arm: Secondary | ICD-10-CM | POA: Diagnosis not present

## 2016-05-10 DIAGNOSIS — R29898 Other symptoms and signs involving the musculoskeletal system: Secondary | ICD-10-CM

## 2016-05-10 NOTE — Patient Instructions (Signed)
Complete 10-12 repetitions. 2-3 times a day.  1) Shoulder Protraction    Begin with elbows by your side, slowly "punch" straight out in front of you.      2) Shoulder Flexion  Supine:     Standing:         Begin with arms at your side with thumbs pointed up, slowly raise both arms up and forward towards overhead.       3) Horizontal abduction/adduction  Supine:   Standing:           Begin with arms straight out in front of you, bring out to the side in at "T" shape. Keep arms straight entire time.      4) Internal & External Rotation    *No band* -Stand with elbows at the side and elbows bent 90 degrees. Move your forearms away from your body, then bring back inward toward the body.     5) Shoulder Abduction  Supine:     Standing:       Lying on your back begin with your arms flat on the table next to your side. Slowly move your arms out to the side so that they go overhead, in a jumping jack or snow angel movement.

## 2016-05-10 NOTE — Therapy (Signed)
[place], [place], [address] Phone: [phone]   Fax:  [phone]  Occupational Therapy Treatment  Patient Details  Name: Tammy [name] MRN: [id number] Date of Birth: Dec 17, 1945 Referring Provider: Sanjuana Kava, MD  Encounter Date: 05/10/2016      OT End of Session - 05/10/16 0943    Visit Number 4   Number of Visits 17   Date for OT Re-Evaluation 06/17/16  mini reassessment 05/20/16   Authorization Type Medicare part A and B   Authorization Time Period before 10th visit   Authorization - Visit Number 4   Authorization - Number of Visits 10   OT Start Time [phone]   OT Stop Time 0945   OT Time Calculation (min) 41 min   Activity Tolerance Patient tolerated treatment well   Behavior During Therapy [facility] for tasks assessed/performed      History reviewed. No pertinent past medical history.  No past surgical history on file.  There were no vitals filed for this visit.      Subjective Assessment - 05/10/16 0916    Subjective  S: It actually doesn't hurt this morning.    Currently in Pain? No/denies            [facility] OT Assessment - 05/10/16 0916    Assessment   Diagnosis Right shoulder pain   Precautions   Precautions None                  OT Treatments/Exercises (OP) - 05/10/16 0917    Exercises   Exercises Shoulder   Shoulder Exercises: Supine   Protraction PROM;AROM;10 reps   Horizontal ABduction PROM;AROM;10 reps   External Rotation PROM;AROM;10 reps   Internal Rotation PROM;AROM;10 reps   Flexion PROM;AROM;10 reps   ABduction PROM;AROM;10 reps   Shoulder Exercises: Standing   Protraction AROM;10 reps   Horizontal ABduction AROM;10 reps   External Rotation AROM;10 reps   Internal Rotation AROM;10 reps   Flexion AROM;10 reps   ABduction AROM;10 reps   Shoulder Exercises: ROM/Strengthening   Wall Wash 1'   Manual Therapy   Manual Therapy Myofascial release   Manual  therapy comments Manual therapy completed prior to exercises.   Myofascial Release Myofascial release and manual stretching complete to right upper arm, trapezius, and scapularis region to decrease fascial restrictions and increase joint mobility in a pain free zone.                 OT Education - 05/10/16 0932    Education provided Yes   Education Details A/ROM exercises   Person(s) Educated Patient   Methods Explanation;Demonstration;Verbal cues;Handout   Comprehension Returned demonstration;Verbalized understanding          OT Short Term Goals - 05/06/16 1059    OT SHORT TERM GOAL #1   Title Pt will be independent with initial RUE HEP (including fine and gross motor exercises)    Time 4   Period Weeks   Status On-going   OT SHORT TERM GOAL #2   Title Pt will increase gross grasp strength to 45lbs to increase overall functional strength and independence with ADLs    Baseline 35lbs   Time 4   Period Weeks   Status On-going   OT SHORT TERM GOAL #3   Title Pt's pain will be 5/10 or less in order to increase overall independence with self-care tasks and to increase patient's quality of life   Baseline 7/10 on eval  Time 4   Period Weeks   Status On-going   OT SHORT TERM GOAL #4   Title Patient's shoulder flexion strength will increase to 4/5 with little to no pain during testing    Baseline mod increased pain during testing; 3/5   Time 4   Period Weeks   Status On-going   OT SHORT TERM GOAL #5   Title Patient will decrease fascial restrictions to mod amount in right UE to increase functional mobility needed to complete reaching activities.    Time 3   Period Weeks   Status On-going           OT Long Term Goals - 05/06/16 1059    OT LONG TERM GOAL #1   Title Patient will be independent with upgraded  HEP   Time 8   Period Weeks   Status On-going   OT LONG TERM GOAL #2   Title Pt will increase gross grasp strength to at least 55lbs to increase her  overall functional strength and independence during ADLs    Time 8   Period Weeks   Status On-going   OT LONG TERM GOAL #3   Title Patient's internal and external rotation strength will increase to 4+/5    Time 8   Period Weeks   Status On-going   OT LONG TERM GOAL #4   Title Patient's pain will be 2/10 or less in order to increase overall independence with self care tasks and increase patient's overall quality of life    Baseline 7/10 pain on eval    Time 8   Period Weeks   Status On-going   OT LONG TERM GOAL #5   Title Pt will be able to tolerate completing an IADL task for at least 20 minutes with no more than 2/10 pain and no rest breaks needed   Time 8   Period Weeks   Status On-going   OT LONG TERM GOAL #6   Title Patient will increase A/ROM to WNL to increase ability to complete overhead tasks using RUE.    Time 7   Period Weeks   Status On-going   OT LONG TERM GOAL #7   Title Patient will decrease fascial restrictions to min amount to increase functional mobility needed to complete overhead tasks using RUE.    Time 7   Period Weeks   Status On-going               Plan - 05/10/16 0943    Clinical Impression Statement A: Progressed to A/ROM supine and standing. Added wall wash. patient tolerate well with increased time. VC for form and technique.    Plan P: Add proximal shoulder strengthening supine and standing.       Patient will benefit from skilled therapeutic intervention in order to improve the following deficits and impairments:  Decreased coordination, Decreased endurance, Decreased strength, Pain, Impaired UE functional use, Increased fascial restricitons, Impaired sensation, Impaired tone  Visit Diagnosis: Other symptoms and signs involving the musculoskeletal system  Stiffness of right shoulder, not elsewhere classified    Problem List There are no active problems to display for this patient.   Ailene Ravel, OTR/L,CBIS  [phone]   05/10/2016, 9:45 AM  [place] [address] [place], [place], [address] Phone: [phone]   Fax:  [phone]  Name: IXAYANA DRANSFIELD MRN: [id number] Date of Birth: [birth year]

## 2016-05-13 ENCOUNTER — Ambulatory Visit (HOSPITAL_COMMUNITY): Payer: Medicare Other

## 2016-05-13 ENCOUNTER — Encounter (HOSPITAL_COMMUNITY): Payer: Self-pay

## 2016-05-13 DIAGNOSIS — R29898 Other symptoms and signs involving the musculoskeletal system: Secondary | ICD-10-CM

## 2016-05-13 DIAGNOSIS — M79601 Pain in right arm: Secondary | ICD-10-CM | POA: Diagnosis not present

## 2016-05-13 DIAGNOSIS — M25611 Stiffness of right shoulder, not elsewhere classified: Secondary | ICD-10-CM

## 2016-05-13 NOTE — Therapy (Signed)
[place], [place], [address] Phone: [phone]   Fax:  [phone]  Occupational Therapy Treatment  Patient Details  Name: Tammy [name] MRN: [id number] Date of Birth: [birth year] Referring Provider: Sanjuana Kava, MD  Encounter Date: 05/13/2016      OT End of Session - 05/13/16 1026    Visit Number 5   Number of Visits 17   Date for OT Re-Evaluation 06/17/16  mini reassessment 05/20/16   Authorization Type Medicare part A and B   Authorization Time Period before 10th visit   Authorization - Visit Number 5   Authorization - Number of Visits 10   OT Start Time 0945   OT Stop Time 1030   OT Time Calculation (min) 45 min   Activity Tolerance Patient tolerated treatment well   Behavior During Therapy [facility] for tasks assessed/performed      History reviewed. No pertinent past medical history.  No past surgical history on file.  There were no vitals filed for this visit.      Subjective Assessment - 05/13/16 0958    Subjective  S: I work on my shoulder a lot at home.   Currently in Pain? No/denies            [facility] OT Assessment - 05/13/16 0959    Assessment   Diagnosis Right shoulder pain   Precautions   Precautions None                  OT Treatments/Exercises (OP) - 05/13/16 0959    Exercises   Exercises Shoulder   Shoulder Exercises: Supine   Protraction PROM;5 reps;AROM;15 reps   Horizontal ABduction PROM;5 reps;AROM;15 reps   External Rotation PROM;5 reps;AROM;15 reps   Internal Rotation PROM;5 reps;AROM;15 reps   Flexion PROM;5 reps;AROM;15 reps   ABduction PROM;5 reps;AROM;15 reps   Shoulder Exercises: Standing   Protraction AROM;12 reps   Horizontal ABduction AROM;12 reps   External Rotation AROM;12 reps   Internal Rotation AROM;12 reps   Flexion AROM;12 reps   ABduction AROM;12 reps   Shoulder Exercises: ROM/Strengthening   UBE (Upper Arm Bike) Level 1 2' forward 2'  reverse   "W" Arms 10X   X to V Arms 10X max VC for form   Proximal Shoulder Strengthening, Supine 15X. Max difficulty with technique and form. Physical assist needed and contant cueing   Manual Therapy   Manual Therapy Myofascial release   Manual therapy comments Manual therapy completed prior to exercises.   Myofascial Release Myofascial release and manual stretching complete to right upper arm, trapezius, and scapularis region to decrease fascial restrictions and increase joint mobility in a pain free zone.                   OT Short Term Goals - 05/06/16 1059    OT SHORT TERM GOAL #1   Title Pt will be independent with initial RUE HEP (including fine and gross motor exercises)    Time 4   Period Weeks   Status On-going   OT SHORT TERM GOAL #2   Title Pt will increase gross grasp strength to 45lbs to increase overall functional strength and independence with ADLs    Baseline 35lbs   Time 4   Period Weeks   Status On-going   OT SHORT TERM GOAL #3   Title Pt's pain will be 5/10 or less in order to increase overall independence with self-care tasks and to increase patient's  quality of life   Baseline 7/10 on eval   Time 4   Period Weeks   Status On-going   OT SHORT TERM GOAL #4   Title Patient's shoulder flexion strength will increase to 4/5 with little to no pain during testing    Baseline mod increased pain during testing; 3/5   Time 4   Period Weeks   Status On-going   OT SHORT TERM GOAL #5   Title Patient will decrease fascial restrictions to mod amount in right UE to increase functional mobility needed to complete reaching activities.    Time 3   Period Weeks   Status On-going           OT Long Term Goals - 05/06/16 1059    OT LONG TERM GOAL #1   Title Patient will be independent with upgraded  HEP   Time 8   Period Weeks   Status On-going   OT LONG TERM GOAL #2   Title Pt will increase gross grasp strength to at least 55lbs to increase her overall  functional strength and independence during ADLs    Time 8   Period Weeks   Status On-going   OT LONG TERM GOAL #3   Title Patient's internal and external rotation strength will increase to 4+/5    Time 8   Period Weeks   Status On-going   OT LONG TERM GOAL #4   Title Patient's pain will be 2/10 or less in order to increase overall independence with self care tasks and increase patient's overall quality of life    Baseline 7/10 pain on eval    Time 8   Period Weeks   Status On-going   OT LONG TERM GOAL #5   Title Pt will be able to tolerate completing an IADL task for at least 20 minutes with no more than 2/10 pain and no rest breaks needed   Time 8   Period Weeks   Status On-going   OT LONG TERM GOAL #6   Title Patient will increase A/ROM to WNL to increase ability to complete overhead tasks using RUE.    Time 7   Period Weeks   Status On-going   OT LONG TERM GOAL #7   Title Patient will decrease fascial restrictions to min amount to increase functional mobility needed to complete overhead tasks using RUE.    Time 7   Period Weeks   Status On-going               Plan - 05/13/16 1026    Clinical Impression Statement A: Continued with A/ROM exercises and added proximal shoulder strengthening. Patient has difficulty maintaining form and technique and requires max VC to complete correctly.    Plan P: Increase independence with completing exercises requiring less cueing. Use mirror for visual cueing. Add scapular theraband exercises.      Patient will benefit from skilled therapeutic intervention in order to improve the following deficits and impairments:  Decreased coordination, Decreased endurance, Decreased strength, Pain, Impaired UE functional use, Increased fascial restricitons, Impaired sensation, Impaired tone  Visit Diagnosis: Other symptoms and signs involving the musculoskeletal system  Stiffness of right shoulder, not elsewhere classified    Problem  List There are no active problems to display for this patient.   Ailene Ravel, OTR/L,CBIS  [phone]  05/13/2016, 10:28 AM  [place] [address] [place], [place], [address] Phone: [phone]   Fax:  [phone]  Name: Tammy  Tammy [name] MRN: [id number] Date of Birth: 1946-03-12

## 2016-05-17 ENCOUNTER — Encounter (HOSPITAL_COMMUNITY): Payer: Medicare Other | Admitting: Occupational Therapy

## 2016-05-20 ENCOUNTER — Telehealth (HOSPITAL_COMMUNITY): Payer: Self-pay | Admitting: Occupational Therapy

## 2016-05-20 ENCOUNTER — Ambulatory Visit (HOSPITAL_COMMUNITY): Payer: Medicare Other | Admitting: Occupational Therapy

## 2016-05-20 NOTE — Telephone Encounter (Signed)
She is sick and throwing up and can not come in today

## 2016-05-24 ENCOUNTER — Encounter: Payer: Self-pay | Admitting: Orthopaedic Surgery

## 2016-05-24 ENCOUNTER — Ambulatory Visit (INDEPENDENT_AMBULATORY_CARE_PROVIDER_SITE_OTHER): Payer: Medicare Other | Admitting: Orthopaedic Surgery

## 2016-05-24 ENCOUNTER — Ambulatory Visit (HOSPITAL_COMMUNITY): Payer: Medicare Other

## 2016-05-24 VITALS — BP 149/85 | HR 62 | Temp 97.3°F | Ht 65.0 in | Wt 185.0 lb

## 2016-05-24 DIAGNOSIS — M25511 Pain in right shoulder: Secondary | ICD-10-CM

## 2016-05-24 NOTE — Progress Notes (Signed)
Patient Tammy [name]:[id number] Tammy [name], female DOB:1946/03/20, 70 y.o. [id number]  Chief Complaint  Patient presents with  . Follow-up    right shoulder pain     HPI  EVALIN Tammy [name] is a 70 y.o. female who has had right shoulder pain. She is much improved.  She has done well from the injection last time.  She is doing her exercises.    HPI  Body mass index is 30.79 kg/(m^2).  ROS  Review of Systems  HENT: Negative for congestion.   Respiratory: Negative for cough and shortness of breath.   Cardiovascular: Negative for chest pain and leg swelling.  Endocrine: Positive for cold intolerance.  Musculoskeletal: Positive for arthralgias.  Allergic/Immunologic: Positive for environmental allergies.    Past Medical History  Diagnosis Date  . Hypertension     Past Surgical History  Procedure Laterality Date  . Knee arthroscopy    . Elbow fracture surgery    . Abdominal hysterectomy  1988    History reviewed. No pertinent family history.  Social History Social History  Substance Use Topics  . Smoking status: Former Research scientist (life sciences)  . Smokeless tobacco: Never Used  . Alcohol Use: No    No Known Allergies  Current Outpatient Prescriptions  Medication Sig Dispense Refill  . HYDROcodone-acetaminophen (NORCO/VICODIN) 5-325 MG tablet Take 1 tablet by mouth every 4 (four) hours as needed for moderate pain (Must last 30 days.  Do not take and drive a car or use machinery.). 120 tablet 0  . levocetirizine (XYZAL) 5 MG tablet Take 5 mg by mouth every evening.    Marland Kitchen losartan (COZAAR) 100 MG tablet Take 100 mg by mouth daily.    . methocarbamol (ROBAXIN) 750 MG tablet Take 750 mg by mouth 3 (three) times daily. Reported on 04/22/2016    . triamcinolone cream (KENALOG) 0.5 % Apply 1 application topically 3 (three) times daily.     No current facility-administered medications for this visit.     Physical Exam  Blood pressure 149/85, pulse 62, temperature 97.3 F (36.3 C), height 5\' 5"  (1.651 m),  weight 185 lb (83.915 kg).  Constitutional: overall normal hygiene, normal nutrition, well developed, normal grooming, normal body habitus. Assistive device:none  Musculoskeletal: gait and station Limp none, muscle tone and strength are normal, no tremors or atrophy is present.  .  Neurological: coordination overall normal.  Deep tendon reflex/nerve stretch intact.  Sensation normal.  Cranial nerves II-XII intact.   Skin:   normal overall no scars, lesions, ulcers or rashes. No psoriasis.  Psychiatric: Alert and oriented x 3.  Recent memory intact, remote memory unclear.  Normal mood and affect. Well groomed.  Good eye contact.  Cardiovascular: overall no swelling, no varicosities, no edema bilaterally, normal temperatures of the legs and arms, no clubbing, cyanosis and good capillary refill.  Lymphatic: palpation is normal.  Examination of right Upper Extremity is done.  Inspection:   Overall:  Elbow non-tender without crepitus or defects, forearm non-tender without crepitus or defects, wrist non-tender without crepitus or defects, hand non-tender.    Shoulder: without glenohumeral joint tenderness, without effusion.   Upper arm: without swelling and tenderness   Range of motion:   Overall:  Full range of motion of the elbow, full range of motion of wrist and full range of motion in fingers.   Shoulder:  right  full degrees forward flexion; full degrees abduction; full degrees internal rotation, full degrees external rotation, full degrees extension, full degrees adduction.   Stability:   Overall:  Shoulder, elbow and wrist stable   Strength and Tone:   Overall full shoulder muscles strength, full upper arm strength and normal upper arm bulk and tone.   The patient has been educated about the nature of the problem(s) and counseled on treatment options.  The patient appeared to understand what I have discussed and is in agreement with it.  Encounter Diagnosis  Name Primary?  .  Right shoulder pain Yes    PLAN Call if any problems.  Precautions discussed.  Continue current medications.   Return to clinic PRN   Electronically [place], MD 6/27/20179:55 AM

## 2016-05-27 ENCOUNTER — Encounter (HOSPITAL_COMMUNITY): Payer: Medicare Other

## 2016-05-30 ENCOUNTER — Encounter (HOSPITAL_COMMUNITY): Payer: Medicare Other

## 2016-06-07 ENCOUNTER — Ambulatory Visit (HOSPITAL_COMMUNITY): Payer: Medicare Other | Attending: Orthopaedic Surgery | Admitting: Occupational Therapy

## 2016-06-07 ENCOUNTER — Telehealth (HOSPITAL_COMMUNITY): Payer: Self-pay | Admitting: Occupational Therapy

## 2016-06-07 NOTE — Telephone Encounter (Signed)
Called pt regarding no show, left message requesting pt call regarding remaining appointments.    Guadelupe Sabin, OTR/L  [phone] 06/07/2016

## 2016-06-10 ENCOUNTER — Encounter (HOSPITAL_COMMUNITY): Payer: Self-pay | Admitting: Occupational Therapy

## 2016-06-10 ENCOUNTER — Telehealth (HOSPITAL_COMMUNITY): Payer: Self-pay | Admitting: Occupational Therapy

## 2016-06-10 ENCOUNTER — Ambulatory Visit (HOSPITAL_COMMUNITY): Payer: Medicare Other | Admitting: Occupational Therapy

## 2016-06-10 NOTE — Therapy (Signed)
[place], [place], [address] Phone: [phone]   Fax:  [phone]  June 10, 2016    Patient: Tammy [name] MRN: [id number] Date of Birth: 1946/09/26  Referring Provider: Sanjuana Kava, MD   OCCUPATIONAL THERAPY DISCHARGE SUMMARY  Visits from Start of Care: 5  Current functional level related to goals / functional outcomes: Pt did not complete _17__ OT visits as planned. Pt discharged on 06/10/16 at patient's request as she is helping with her newborn granddaughter in [place] and does not have a known return date. Pt will follow up with MD on return and request new referral if necessary.    Remaining deficits: Unknown about remaining deficits.     Plan: Patient agrees to discharge.  Patient goals were not met. Patient is being discharged due to the patient's request.  ?????           Guadelupe Sabin, OTR/L  [phone] 06/10/2016   [facility] [address] [place], [place], [address] Phone: [phone]   Fax:  [phone]  Patient: Tammy [name] MRN: [id number] Date of Birth: [birth year]

## 2016-06-10 NOTE — Telephone Encounter (Signed)
Called pt regarding no-show, left message and requested pt call to let us know if she intends to continue therapy or if she would like to be discharged. Informed pt she will only be able to make one appointment at a time due to 2 no-shows if she wants to continue therapy.   Guadelupe Sabin, OTR/L  [phone] 06/10/2016

## 2016-06-13 DIAGNOSIS — E782 Mixed hyperlipidemia: Secondary | ICD-10-CM | POA: Diagnosis not present

## 2016-06-13 DIAGNOSIS — R7301 Impaired fasting glucose: Secondary | ICD-10-CM | POA: Diagnosis not present

## 2016-06-13 DIAGNOSIS — R946 Abnormal results of thyroid function studies: Secondary | ICD-10-CM | POA: Diagnosis not present

## 2016-06-14 ENCOUNTER — Encounter (HOSPITAL_COMMUNITY): Payer: Medicare Other | Admitting: Occupational Therapy

## 2016-06-17 DIAGNOSIS — R7301 Impaired fasting glucose: Secondary | ICD-10-CM | POA: Diagnosis not present

## 2016-06-17 DIAGNOSIS — E782 Mixed hyperlipidemia: Secondary | ICD-10-CM | POA: Diagnosis not present

## 2016-06-17 DIAGNOSIS — N182 Chronic kidney disease, stage 2 (mild): Secondary | ICD-10-CM | POA: Diagnosis not present

## 2016-06-17 DIAGNOSIS — J302 Other seasonal allergic rhinitis: Secondary | ICD-10-CM | POA: Diagnosis not present

## 2016-06-17 DIAGNOSIS — Z0001 Encounter for general adult medical examination with abnormal findings: Secondary | ICD-10-CM | POA: Diagnosis not present

## 2016-06-17 DIAGNOSIS — D509 Iron deficiency anemia, unspecified: Secondary | ICD-10-CM | POA: Diagnosis not present

## 2016-06-17 DIAGNOSIS — I10 Essential (primary) hypertension: Secondary | ICD-10-CM | POA: Diagnosis not present

## 2016-06-24 ENCOUNTER — Telehealth (INDEPENDENT_AMBULATORY_CARE_PROVIDER_SITE_OTHER): Payer: Self-pay | Admitting: *Deleted

## 2016-06-24 ENCOUNTER — Encounter (INDEPENDENT_AMBULATORY_CARE_PROVIDER_SITE_OTHER): Payer: Self-pay | Admitting: *Deleted

## 2016-06-24 NOTE — Telephone Encounter (Signed)
Patient needs trilyte 

## 2016-06-28 MED ORDER — PEG 3350-KCL-NA BICARB-NACL 420 G PO SOLR: 4000.0000 mL | Freq: Once | ORAL | 0 refills | Status: AC

## 2016-07-11 ENCOUNTER — Telehealth (INDEPENDENT_AMBULATORY_CARE_PROVIDER_SITE_OTHER): Payer: Self-pay | Admitting: *Deleted

## 2016-07-11 NOTE — Telephone Encounter (Signed)
Referring MD/PCP: hall   Procedure: tcs  Reason/Indication:  Screening, fam hx colon ca  Has patient had this procedure before?  Yes, 2007 -- epic  If so, when, by whom and where?    Is there a family history of colon cancer?  Yes, grandfather  Who?  What age when diagnosed?    Is patient diabetic?   no      Does patient have prosthetic heart valve or mechanical valve?  no  Do you have a pacemaker?  no  Has patient ever had endocarditis? no  Has patient had joint replacement within last 12 months?  no  Does patient tend to be constipated or take laxatives? no  Does patient have a history of alcohol/drug use?  no  Is patient on Coumadin, Plavix and/or Aspirin? no  Medications: losartan 100/25 mg daily, xyzal 5 mg prn  Allergies: nkda  Medication Adjustment:   Procedure date & time: 08/11/16 at 730

## 2016-07-13 NOTE — Telephone Encounter (Signed)
agree

## 2016-08-11 ENCOUNTER — Encounter (HOSPITAL_COMMUNITY): Payer: Self-pay | Admitting: *Deleted

## 2016-08-11 ENCOUNTER — Encounter (HOSPITAL_COMMUNITY)
Admission: RE | Disposition: A | Discharge status: Home / Self Care | Payer: Self-pay | Source: Ambulatory Visit | Attending: Internal Medicine

## 2016-08-11 ENCOUNTER — Ambulatory Visit (HOSPITAL_COMMUNITY)
Admission: RE | Admit: 2016-08-11 | Discharge: 2016-08-11 | Disposition: A | Discharge status: Home / Self Care | Payer: Medicare Other | Source: Ambulatory Visit | Attending: Internal Medicine | Admitting: Internal Medicine

## 2016-08-11 DIAGNOSIS — I10 Essential (primary) hypertension: Secondary | ICD-10-CM | POA: Diagnosis not present

## 2016-08-11 DIAGNOSIS — Z79899 Other long term (current) drug therapy: Secondary | ICD-10-CM | POA: Diagnosis not present

## 2016-08-11 DIAGNOSIS — K6289 Other specified diseases of anus and rectum: Secondary | ICD-10-CM | POA: Insufficient documentation

## 2016-08-11 DIAGNOSIS — Z87891 Personal history of nicotine dependence: Secondary | ICD-10-CM | POA: Diagnosis not present

## 2016-08-11 DIAGNOSIS — K644 Residual hemorrhoidal skin tags: Secondary | ICD-10-CM | POA: Insufficient documentation

## 2016-08-11 DIAGNOSIS — K5730 Diverticulosis of large intestine without perforation or abscess without bleeding: Secondary | ICD-10-CM | POA: Insufficient documentation

## 2016-08-11 DIAGNOSIS — Z800 Family history of malignant neoplasm of digestive organs: Secondary | ICD-10-CM | POA: Diagnosis not present

## 2016-08-11 DIAGNOSIS — Z1211 Encounter for screening for malignant neoplasm of colon: Secondary | ICD-10-CM | POA: Insufficient documentation

## 2016-08-11 HISTORY — PX: COLONOSCOPY: SHX5424

## 2016-08-11 SURGERY — COLONOSCOPY
Anesthesia: Moderate Sedation

## 2016-08-11 MED ORDER — SODIUM CHLORIDE 0.9 % IV SOLN
INTRAVENOUS | Status: DC
  Administered 2016-08-11: 1000 mL via INTRAVENOUS

## 2016-08-11 MED ORDER — MEPERIDINE HCL 50 MG/ML IJ SOLN
INTRAMUSCULAR | Status: DC
Start: 2016-08-11 — End: 2016-08-11
  Filled 2016-08-11: qty 1

## 2016-08-11 MED ORDER — MIDAZOLAM HCL 5 MG/5ML IJ SOLN
INTRAMUSCULAR | Status: AC
  Filled 2016-08-11: qty 10

## 2016-08-11 MED ORDER — MIDAZOLAM HCL 5 MG/5ML IJ SOLN
INTRAMUSCULAR | Status: DC | PRN
  Administered 2016-08-11: 1 mg via INTRAVENOUS
  Administered 2016-08-11 (×3): 2 mg via INTRAVENOUS

## 2016-08-11 MED ORDER — MEPERIDINE HCL 50 MG/ML IJ SOLN
INTRAMUSCULAR | Status: DC | PRN
  Administered 2016-08-11 (×2): 25 mg via INTRAVENOUS

## 2016-08-11 MED ORDER — STERILE WATER FOR IRRIGATION IR SOLN
Status: DC | PRN
  Administered 2016-08-11: 08:00:00

## 2016-08-11 NOTE — H&P (Signed)
Tammy [name] is an 70 y.o. female.   Chief Complaint: Patient is here for colonoscopy. HPI: Patient is 70 year old African-American female who is here for screening colonoscopy. Last exam was in May 2007 and was normal. She denies abdominal pain change in bowel habits or rectal bleeding. Family history significant for CRC in maternal grandmother who was in late 13s or early 98s at the time of diagnosis.  Past Medical History:  Diagnosis Date  . Hypertension     Past Surgical History:  Procedure Laterality Date  . ABDOMINAL HYSTERECTOMY  1988  . ELBOW FRACTURE SURGERY    . KNEE ARTHROSCOPY      Family History  Problem Relation Age of Onset  . Cancer Maternal Grandmother    Social History:  reports that she quit smoking about 32 years ago. Her smoking use included Cigarettes. She has a 11.25 pack-year smoking history. She has never used smokeless tobacco. She reports that she does not drink alcohol or use drugs.  Allergies: No Known Allergies  Medications Prior to Admission  Medication Sig Dispense Refill  . losartan (COZAAR) 100 MG tablet Take 100 mg by mouth daily.    Marland Kitchen HYDROcodone-acetaminophen (NORCO/VICODIN) 5-325 MG tablet Take 1 tablet by mouth every 4 (four) hours as needed for moderate pain (Must last 30 days.  Do not take and drive a car or use machinery.). 120 tablet 0  . levocetirizine (XYZAL) 5 MG tablet Take 5 mg by mouth every evening.    . methocarbamol (ROBAXIN) 750 MG tablet Take 750 mg by mouth 3 (three) times daily. Reported on 04/22/2016    . triamcinolone cream (KENALOG) 0.5 % Apply 1 application topically 3 (three) times daily.      No results found for this or any previous visit (from the past 48 hour(s)). No results found.  ROS  Blood pressure (!) 147/63, pulse 63, temperature 98.2 F (36.8 C), temperature source Oral, resp. rate 13, height 5\' 5"  (1.651 m), weight 175 lb (79.4 kg), SpO2 99 %. Physical Exam  Constitutional: She appears well-developed  and well-nourished.  HENT:  Mouth/Throat: Oropharynx is clear and moist.  Eyes: Conjunctivae are normal. No scleral icterus.  Neck: No thyromegaly present.  Cardiovascular: Normal rate, regular rhythm and normal heart sounds.   No murmur heard. Respiratory: Effort normal and breath sounds normal.  GI: Soft. She exhibits no distension and no mass. There is no tenderness.  Musculoskeletal: She exhibits no edema.  Lymphadenopathy:    She has no cervical adenopathy.  Neurological: She is alert.  Skin: Skin is warm and dry.     Assessment/Plan Average risk screening colonoscopy.  Hildred Laser, MD 08/11/2016, 7:33 AM

## 2016-08-11 NOTE — Op Note (Signed)
[facility] Patient Name: Tammy [name] Procedure Date: 08/11/2016 7:34 AM MRN: [id number] Date of Birth: March 01, 1946 Attending MD: Hildred Laser , MD CSN: [id number] Age: 70 Admit Type: Outpatient Procedure:                Colonoscopy Indications:              Screening for colorectal malignant neoplasm Providers:                Hildred Laser, MD, Otis Peak B. Sharon Seller, RN, Sherlyn Lees, Technician Referring MD:             Delphina Cahill, MD Medicines:                Meperidine 50 mg IV, Midazolam 7 mg IV Complications:            No immediate complications. Estimated Blood Loss:     Estimated blood loss: none. Procedure:                Pre-Anesthesia Assessment:                           - Prior to the procedure, a History and Physical                            was performed, and patient medications and                            allergies were reviewed. The patient's tolerance of                            previous anesthesia was also reviewed. The risks                            and benefits of the procedure and the sedation                            options and risks were discussed with the patient.                            All questions were answered, and informed consent                            was obtained. Prior Anticoagulants: The patient has                            taken no previous anticoagulant or antiplatelet                            agents. ASA Grade Assessment: II - A patient with                            mild systemic disease. After reviewing the risks  and benefits, the patient was deemed in                            satisfactory condition to undergo the procedure.                           After obtaining informed consent, the colonoscope                            was passed under direct vision. Throughout the                            procedure, the patient's blood pressure, pulse, and                   oxygen saturations were monitored continuously. The                            [id number]) was introduced through the                            anus and advanced to the the cecum, identified by                            appendiceal orifice and ileocecal valve. The                            colonoscopy was performed without difficulty. The                            patient tolerated the procedure well. The quality                            of the bowel preparation was excellent. The                            ileocecal valve, appendiceal orifice, and rectum                            were photographed. Scope In: 7:43:03 AM Scope Out: 8:00:33 AM Scope Withdrawal Time: 0 hours 6 minutes 46 seconds  Total Procedure Duration: 0 hours 17 minutes 30 seconds  Findings:      Multiple small and large-mouthed diverticula were found in the sigmoid       colon, descending colon and transverse colon.      External hemorrhoids were found during retroflexion. The hemorrhoids       were small.      Anal papilla(e) were hypertrophied. Impression:               - Diverticulosis in the sigmoid colon, in the                            descending colon and in the transverse colon.                           - External  hemorrhoids.                           - Anal papilla(e) were hypertrophied.                           - No specimens collected. Moderate Sedation:      Moderate (conscious) sedation was administered by the endoscopy nurse       and supervised by the endoscopist. The following parameters were       monitored: oxygen saturation, heart rate, blood pressure, CO2       capnography and response to care. Total physician intraservice time was       23 minutes. Recommendation:           - Patient has a contact number available for                            emergencies. The signs and symptoms of potential                            delayed complications were discussed  with the                            patient. Return to normal activities tomorrow.                            Written discharge instructions were provided to the                            patient.                           - High fiber diet today.                           - Continue present medications.                           - No repeat colonoscopy due to age. Procedure Code(s):        --- Professional ---                           [phone], Colonoscopy, flexible; diagnostic, including                            collection of specimen(s) by brushing or washing,                            when performed (separate procedure)                           99152, Moderate sedation services provided by the                            same physician or other qualified health care  professional performing the diagnostic or                            therapeutic service that the sedation supports,                            requiring the presence of an independent trained                            observer to assist in the monitoring of the                            patient's level of consciousness and physiological                            status; initial 15 minutes of intraservice time,                            patient age 33 years or older                           [phone], Moderate sedation services; each additional                            15 minutes intraservice time Diagnosis Code(s):        --- Professional ---                           Z12.11, Encounter for screening for malignant                            neoplasm of colon                           K64.4, Residual hemorrhoidal skin tags                           K62.89, Other specified diseases of anus and rectum                           K57.30, Diverticulosis of large intestine without                            perforation or abscess without bleeding CPT copyright 2016 American Medical Association. All  rights reserved. The codes documented in this report are preliminary and upon coder review may  be revised to meet current compliance requirements. Hildred Laser, MD Hildred Laser, MD 08/11/2016 8:07:57 AM This report has been signed electronically. Number of Addenda: 0

## 2016-08-11 NOTE — Discharge Instructions (Signed)
Resume usual medications and high fiber diet. No driving for 24 hours.    Colonoscopy, Care After These instructions give you information on caring for yourself after your procedure. Your doctor may also give you more specific instructions. Call your doctor if you have any problems or questions after your procedure. HOME CARE  Do not drive for 24 hours.  Do not sign important papers or use machinery for 24 hours.  You may shower.  You may go back to your usual activities, but go slower for the first 24 hours.  Take rest breaks often during the first 24 hours.  Walk around or use warm packs on your belly (abdomen) if you have belly cramping or gas.  Drink enough fluids to keep your pee (urine) clear or pale yellow.  Resume your normal diet. Avoid heavy or fried foods.  Avoid drinking alcohol for 24 hours or as told by your doctor.  Only take medicines as told by your doctor. If a tissue sample (biopsy) was taken during the procedure:   Do not take aspirin or blood thinners for 7 days, or as told by your doctor.  Do not drink alcohol for 7 days, or as told by your doctor.  Eat soft foods for the first 24 hours. GET HELP IF: You still have a small amount of blood in your poop (stool) 2-3 days after the procedure. GET HELP RIGHT AWAY IF:  You have more than a small amount of blood in your poop.  You see clumps of tissue (blood clots) in your poop.  Your belly is puffy (swollen).  You feel sick to your stomach (nauseous) or throw up (vomit).  You have a fever.  You have belly pain that gets worse and medicine does not help. MAKE SURE YOU:  Understand these instructions.  Will watch your condition.  Will get help right away if you are not doing well or get worse.   This information is not intended to replace advice given to you by your health care provider. Make sure you discuss any questions you have with your health care provider.   Document Released:  12/17/2010 Document Revised: 11/19/2013 Document Reviewed: 07/22/2013 Elsevier Interactive Patient Education [employer].   Diverticulosis Diverticulosis is the condition that develops when small pouches (diverticula) form in the wall of your colon. Your colon, or large intestine, is where water is absorbed and stool is formed. The pouches form when the inside layer of your colon pushes through weak spots in the outer layers of your colon. CAUSES  No one knows exactly what causes diverticulosis. RISK FACTORS  Being older than 40. Your risk for this condition increases with age. Diverticulosis is rare in people younger than 40 years. By age 2, almost everyone has it.  Eating a low-fiber diet.  Being frequently constipated.  Being overweight.  Not getting enough exercise.  Smoking.  Taking over-the-counter pain medicines, like aspirin and ibuprofen. SYMPTOMS  Most people with diverticulosis do not have symptoms. DIAGNOSIS  Because diverticulosis often has no symptoms, health care providers often discover the condition during an exam for other colon problems. In many cases, a health care provider will diagnose diverticulosis while using a flexible scope to examine the colon (colonoscopy). TREATMENT  If you have never developed an infection related to diverticulosis, you may not need treatment. If you have had an infection before, treatment may include:  Eating more fruits, vegetables, and grains.  Taking a fiber supplement.  Taking a live bacteria supplement (  probiotic).  Taking medicine to relax your colon. HOME CARE INSTRUCTIONS   Drink at least 6-8 glasses of water each day to prevent constipation.  Try not to strain when you have a bowel movement.  Keep all follow-up appointments. If you have had an infection before:  Increase the fiber in your diet as directed by your health care provider or dietitian.  Take a dietary fiber supplement if your health care  provider approves.  Only take medicines as directed by your health care provider. SEEK MEDICAL CARE IF:   You have abdominal pain.  You have bloating.  You have cramps.  You have not gone to the bathroom in 3 days. SEEK IMMEDIATE MEDICAL CARE IF:   Your pain gets worse.  Yourbloating becomes very bad.  You have a fever or chills, and your symptoms suddenly get worse.  You begin vomiting.  You have bowel movements that are bloody or black. MAKE SURE YOU:  Understand these instructions.  Will watch your condition.  Will get help right away if you are not doing well or get worse.   This information is not intended to replace advice given to you by your health care provider. Make sure you discuss any questions you have with your health care provider.   Document Released: 08/11/2004 Document Revised: 11/19/2013 Document Reviewed: 10/09/2013 Elsevier Interactive Patient Education [employer].

## 2016-08-17 ENCOUNTER — Encounter (HOSPITAL_COMMUNITY): Payer: Self-pay | Admitting: Internal Medicine

## 2016-08-24 ENCOUNTER — Encounter: Payer: Self-pay | Admitting: Orthopaedic Surgery

## 2016-08-24 ENCOUNTER — Ambulatory Visit (INDEPENDENT_AMBULATORY_CARE_PROVIDER_SITE_OTHER): Payer: Medicare Other | Admitting: Orthopaedic Surgery

## 2016-08-24 VITALS — BP 125/70 | HR 76 | Temp 98.1°F | Ht 64.0 in | Wt 181.0 lb

## 2016-08-24 DIAGNOSIS — M25511 Pain in right shoulder: Secondary | ICD-10-CM

## 2016-08-24 MED ORDER — HYDROCODONE-ACETAMINOPHEN 5-325 MG PO TABS: 1.0000 | ORAL_TABLET | Freq: Four times a day (QID) | ORAL | 0 refills | Status: DC | PRN

## 2016-08-24 NOTE — Progress Notes (Signed)
Patient [id number] Tammy [name], female DOB:Jun 07, 1946, 70 y.o. [id number]  Chief Complaint  Patient presents with  . Follow-up    right shoulder pain    HPI  Tammy [name] is a 70 y.o. female who has right shoulder pain that is getting worse.  She has more pain now. She has pain at night and with overhead use. She has no new trauma.  I will get a MRI of the shoulder. She has no paresthesias. She has taken her medicine with little help. HPI  Body mass index is 31.07 kg/m.  ROS  Review of Systems  HENT: Negative for congestion.   Respiratory: Negative for cough and shortness of breath.   Cardiovascular: Negative for chest pain and leg swelling.  Endocrine: Positive for cold intolerance.  Musculoskeletal: Positive for arthralgias.  Allergic/Immunologic: Positive for environmental allergies.    Past Medical History:  Diagnosis Date  . Hypertension     Past Surgical History:  Procedure Laterality Date  . ABDOMINAL HYSTERECTOMY  1988  . COLONOSCOPY N/A 08/11/2016   Procedure: COLONOSCOPY;  Surgeon: Rogene Houston, MD;  Location: [facility];  Service: Endoscopy;  Laterality: N/A;  730  . ELBOW FRACTURE SURGERY    . KNEE ARTHROSCOPY      Family History  Problem Relation Age of Onset  . Cancer Maternal Grandmother     Social History Social History  Substance Use Topics  . Smoking status: Former Smoker    Packs/day: 0.75    Years: 15.00    Types: Cigarettes    Quit date: 11/28/1983  . Smokeless tobacco: Never Used  . Alcohol use No    No Known Allergies  Current Outpatient Prescriptions  Medication Sig Dispense Refill  . HYDROcodone-acetaminophen (NORCO/VICODIN) 5-325 MG tablet Take 1 tablet by mouth every 6 (six) hours as needed for moderate pain (Must last 30 days.Do not take and drive a car or use machinery.). 110 tablet 0  . levocetirizine (XYZAL) 5 MG tablet Take 5 mg by mouth every evening.    Marland Kitchen losartan (COZAAR) 100 MG tablet Take 100 mg by mouth daily.      No current facility-administered medications for this visit.      Physical Exam  Blood pressure 125/70, pulse 76, temperature 98.1 F (36.7 C), height 5\' 4"  (1.626 m), weight 181 lb (82.1 kg).  Constitutional: overall normal hygiene, normal nutrition, well developed, normal grooming, normal body habitus. Assistive device:none  Musculoskeletal: gait and station Limp none, muscle tone and strength are normal, no tremors or atrophy is present.  .  Neurological: coordination overall normal.  Deep tendon reflex/nerve stretch intact.  Sensation normal.  Cranial nerves II-XII intact.   Skin:   Normal overall no scars, lesions, ulcers or rashes. No psoriasis.  Psychiatric: Alert and oriented x 3.  Recent memory intact, remote memory unclear.  Normal mood and affect. Well groomed.  Good eye contact.  Cardiovascular: overall no swelling, no varicosities, no edema bilaterally, normal temperatures of the legs and arms, no clubbing, cyanosis and good capillary refill.  Lymphatic: palpation is normal.  Examination of right Upper Extremity is done.  Inspection:   Overall:  Elbow non-tender without crepitus or defects, forearm non-tender without crepitus or defects, wrist non-tender without crepitus or defects, hand non-tender.    Shoulder: with glenohumeral joint tenderness, without effusion.   Upper arm: without swelling and tenderness   Range of motion:   Overall:  Full range of motion of the elbow, full range of motion of wrist  and full range of motion in fingers.   Shoulder:  right  180 degrees forward flexion; 165 degrees abduction; 35 degrees internal rotation, 35 degrees external rotation, 15 degrees extension, 40 degrees adduction.   Stability:   Overall:  Shoulder, elbow and wrist stable   Strength and Tone:   Overall full shoulder muscles strength, full upper arm strength and normal upper arm bulk and tone.   The patient has been educated about the nature of the problem(s)  and counseled on treatment options.  The patient appeared to understand what I have discussed and is in agreement with it.  Encounter Diagnosis  Name Primary?  . Right shoulder pain Yes    PLAN Call if any problems.  Precautions discussed.  Continue current medications.   Return to clinic after MRI of the right shoulder   Electronically Signed Sanjuana Kava, MD 9/27/20179:45 AM

## 2016-08-26 ENCOUNTER — Other Ambulatory Visit (HOSPITAL_COMMUNITY): Payer: Self-pay | Admitting: Internal Medicine

## 2016-08-26 DIAGNOSIS — Z1231 Encounter for screening mammogram for malignant neoplasm of breast: Secondary | ICD-10-CM

## 2016-09-04 ENCOUNTER — Ambulatory Visit
Admission: RE | Admit: 2016-09-04 | Discharge: 2016-09-04 | Disposition: A | Discharge status: Home / Self Care | Payer: Medicare Other | Source: Ambulatory Visit | Attending: Orthopaedic Surgery | Admitting: Orthopaedic Surgery

## 2016-09-04 ENCOUNTER — Other Ambulatory Visit: Payer: Medicare Other

## 2016-09-04 DIAGNOSIS — M25511 Pain in right shoulder: Secondary | ICD-10-CM | POA: Diagnosis not present

## 2016-09-06 ENCOUNTER — Ambulatory Visit: Payer: Medicare Other | Admitting: Orthopaedic Surgery

## 2016-09-06 DIAGNOSIS — Z23 Encounter for immunization: Secondary | ICD-10-CM | POA: Diagnosis not present

## 2016-09-07 ENCOUNTER — Encounter: Payer: Self-pay | Admitting: Orthopaedic Surgery

## 2016-09-07 ENCOUNTER — Ambulatory Visit (INDEPENDENT_AMBULATORY_CARE_PROVIDER_SITE_OTHER): Payer: Medicare Other | Admitting: Orthopaedic Surgery

## 2016-09-07 VITALS — BP 121/65 | HR 88 | Temp 97.7°F | Ht 64.0 in | Wt 182.0 lb

## 2016-09-07 DIAGNOSIS — M12811 Other specific arthropathies, not elsewhere classified, right shoulder: Secondary | ICD-10-CM | POA: Diagnosis not present

## 2016-09-07 DIAGNOSIS — M75101 Unspecified rotator cuff tear or rupture of right shoulder, not specified as traumatic: Principal | ICD-10-CM

## 2016-09-07 NOTE — Progress Notes (Signed)
Patient Tammy [name] Tammy [name], female DOB:[birth year], 70 y.o. TZ:[id number]  Chief Complaint  Patient presents with  . Follow-up    mri review right shoulder    HPI  Tammy [name] is a 70 y.o. female who has pain of the right shoulder that has not been helped with PT/OT, rest, ice, medicine, exercises, rubs.  She had a MRI of the right shoulder 09-04-16 which shows: IMPRESSION: 1. Large full-thickness retracted supraspinous tendon tear. 2. Significant tendinopathy and articular surface tearing involving the infraspinatus tendon. 3. 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 4. Intact long head biceps tendon and glenoid labrum.  I will have her see Dr. Aline Brochure for discussion of possible surgery.  I have told her that even with surgery she will not have the shoulder she had as a young girl.  She understands and agrees to see him.  HPI  Body mass index is 31.24 kg/m.  ROS  Review of Systems  HENT: Negative for congestion.   Respiratory: Negative for cough and shortness of breath.   Cardiovascular: Negative for chest pain and leg swelling.  Endocrine: Positive for cold intolerance.  Musculoskeletal: Positive for arthralgias.  Allergic/Immunologic: Positive for environmental allergies.    Past Medical History:  Diagnosis Date  . Hypertension     Past Surgical History:  Procedure Laterality Date  . ABDOMINAL HYSTERECTOMY  1988  . COLONOSCOPY N/A 08/11/2016   Procedure: COLONOSCOPY;  Surgeon: Rogene Houston, MD;  Location: [facility];  Service: Endoscopy;  Laterality: N/A;  730  . ELBOW FRACTURE SURGERY    . KNEE ARTHROSCOPY      Family History  Problem Relation Age of Onset  . Cancer Maternal Grandmother     Social History Social History  Substance Use Topics  . Smoking status: Former Smoker    Packs/day: 0.75    Years: 15.00    Types: Cigarettes    Quit date: 11/28/1983  . Smokeless  tobacco: Never Used  . Alcohol use No    No Known Allergies  Current Outpatient Prescriptions  Medication Sig Dispense Refill  . HYDROcodone-acetaminophen (NORCO/VICODIN) 5-325 MG tablet Take 1 tablet by mouth every 6 (six) hours as needed for moderate pain (Must last 30 days.Do not take and drive a car or use machinery.). 110 tablet 0  . levocetirizine (XYZAL) 5 MG tablet Take 5 mg by mouth every evening.    Marland Kitchen losartan (COZAAR) 100 MG tablet Take 100 mg by mouth daily.     No current facility-administered medications for this visit.      Physical Exam  Blood pressure 121/65, pulse 88, temperature 97.7 F (36.5 C), height 5\' 4"  (1.626 m), weight 182 lb (82.6 kg).  Constitutional: overall normal hygiene, normal nutrition, well developed, normal grooming, normal body habitus. Assistive device:none  Musculoskeletal: gait and station Limp none, muscle tone and strength are normal, no tremors or atrophy is present.  .  Neurological: coordination overall normal.  Deep tendon reflex/nerve stretch intact.  Sensation normal.  Cranial nerves II-XII intact.   Skin:   Normal overall no scars, lesions, ulcers or rashes. No psoriasis.  Psychiatric: Alert and oriented x 3.  Recent memory intact, remote memory unclear.  Normal mood and affect. Well groomed.  Good eye contact.  Cardiovascular: overall no swelling, no varicosities, no edema bilaterally, normal temperatures of the legs and arms, no clubbing, cyanosis and good capillary refill.  Lymphatic: palpation is normal.  She  has decreased motion of the right shoulder.  Abduction 120, forward 145, internal 25, external 25, extension 10, adduction full.  NV intact.  She has no redness or swelling.  The patient has been educated about the nature of the problem(s) and counseled on treatment options.  The patient appeared to understand what I have discussed and is in agreement with it.  Encounter Diagnosis  Name Primary?  . Rotator cuff  tear arthropathy of right shoulder Yes    PLAN Call if any problems.  Precautions discussed.  Continue current medications.   Return to clinic see Dr. Aline Brochure for discussion about surgery of the shoulder on the right   Electronically Signed Sanjuana Kava, MD 10/11/20174:12 PM

## 2016-09-16 ENCOUNTER — Ambulatory Visit (INDEPENDENT_AMBULATORY_CARE_PROVIDER_SITE_OTHER): Payer: Medicare Other | Admitting: Orthopedic Surgery

## 2016-09-16 ENCOUNTER — Encounter: Payer: Self-pay | Admitting: Orthopedic Surgery

## 2016-09-16 VITALS — BP 142/90 | HR 83 | Ht 64.0 in | Wt 182.0 lb

## 2016-09-16 DIAGNOSIS — M75101 Unspecified rotator cuff tear or rupture of right shoulder, not specified as traumatic: Secondary | ICD-10-CM | POA: Diagnosis not present

## 2016-09-16 MED ORDER — ACETAMINOPHEN-CODEINE #3 300-30 MG PO TABS: 1.0000 | ORAL_TABLET | Freq: Four times a day (QID) | ORAL | 0 refills | Status: DC | PRN

## 2016-09-16 NOTE — Patient Instructions (Signed)
Surgery Nov 9

## 2016-09-16 NOTE — Progress Notes (Signed)
Chief Complaint  Patient presents with  . Shoulder Injury    RIGHT ROTATOR CUFF TEAR   HPI 70 year old female presents for possible rotator cuff repair. Patient followed by Dr. Luna Glasgow found to have rotator cuff tear on MRI she has pain loss of motion and weakness area she has night pain. She can't functionally do activities of daily living such as getting dressed.  Prior treatment included injection, pain medicine (not tolerated), physical therapy. She's had pain for a year may have started when she went to help her grandson in [place]  Constitutional: Negative for chills, fever and weight loss.  Respiratory: Negative for shortness of breath.   Cardiovascular: Negative for chest pain.  Neurological: Negative for tingling.    Past Medical History:  Diagnosis Date  . Hypertension     Past Surgical History:  Procedure Laterality Date  . ABDOMINAL HYSTERECTOMY  1988  . COLONOSCOPY N/A 08/11/2016   Procedure: COLONOSCOPY;  Surgeon: Rogene Houston, MD;  Location[facility];  Service: Endoscopy;  Laterality: N/A;  730  . ELBOW FRACTURE SURGERY    . KNEE ARTHROSCOPY     Family History  Problem Relation Age of Onset  . Cancer Maternal Grandmother    Social History  Substance Use Topics  . Smoking status: Former Smoker    Packs/day: 0.75    Years: 15.00    Types: Cigarettes    Quit date: 11/28/1983  . Smokeless tobacco: Never Used  . Alcohol use No   No outpatient prescriptions have been marked as taking for the 09/16/16 encounter (Office Visit) with Carole Civil, MD.    BP (!) 142/90   Pulse 83   Ht 5\' 4"  (1.626 m)   Wt 182 lb (82.6 kg)   BMI 31.24 kg/m   Physical Exam  Constitutional: She is oriented to person, place, and time. She appears well-developed and well-nourished. No distress.  Cardiovascular: Normal rate and intact distal pulses.   Neurological: She is alert and oriented to person, place, and time. She has normal reflexes.  She exhibits normal muscle tone. Coordination normal.  Skin: Skin is warm and dry. No rash noted. She is not diaphoretic. No erythema. No pallor.  Psychiatric: She has a normal mood and affect. Her behavior is normal. Judgment and thought content normal.    Ortho Exam  Left shoulder no tenderness or swelling, full range of motion. Shoulder stable in abduction external rotation. Normal strength in rotator cuff. Scans intact. Distal pulses are normal in the radial and ulnar artery. Lymph nodes on the left side are normal sensation remains intact no pathologic reflexes  Right shoulder external rotation 50 passive flexion 120 active flexion 90 and painful. No instability her supraspinatus tendon is very weak she has a positive drop test skin is intact there is no instability pulses are normal lymph nodes are negative sensation is normal reflexes are normal. ASSESSMENT: My personal interpretation of the images:  MRI shows a retracted supraspinatus tendon before meals joint arthrosis  At one year of symptoms she probably had a progressively tearing going on in the supraspinatus tendon  We discussed possibility of nonhealing, one year of recovery and she will have some help at home from friends  Plan open right rotator cuff repair   Arther Abbott, MD 09/16/2016 12:22 PM  .meds

## 2016-09-29 ENCOUNTER — Ambulatory Visit (HOSPITAL_COMMUNITY)
Admission: RE | Admit: 2016-09-29 | Discharge: 2016-09-29 | Disposition: A | Discharge status: Home / Self Care | Payer: Medicare Other | Source: Ambulatory Visit | Attending: Internal Medicine | Admitting: Internal Medicine

## 2016-09-29 DIAGNOSIS — Z1231 Encounter for screening mammogram for malignant neoplasm of breast: Secondary | ICD-10-CM | POA: Diagnosis not present

## 2016-10-03 NOTE — Patient Instructions (Signed)
Tammy [name]  10/03/2016     @PREFPERIOPPHARMACY @   Your procedure is scheduled on  10/06/2016   Report to [facility] at  1130  A.M.  Call this number if you have problems the morning of surgery:  [phone]   Remember:  Do not eat food or drink liquids after midnight.  Take these medicines the morning of surgery with A SIP OF WATER  Norco, cozaar, levothyroxine.   Do not wear jewelry, make-up or nail polish.  Do not wear lotions, powders, or perfumes, or deoderant.  Do not shave 48 hours prior to surgery.  Men may shave face and neck.  Do not bring valuables to the hospital.  [facility] is not responsible for any belongings or valuables.  Contacts, dentures or bridgework may not be worn into surgery.  Leave your suitcase in the car.  After surgery it may be brought to your room.  For patients admitted to the hospital, discharge time will be determined by your treatment team.  Patients discharged the day of surgery will not be allowed to drive home.   Name and phone number of your driver:   family Special instructions:  None  Please read over the following fact sheets that you were given. Anesthesia Post-op Instructions and Care and Recovery After Surgery       Rotator Cuff Injury Rotator cuff injury is any type of injury to the set of muscles and tendons that make up the stabilizing unit of your shoulder. This unit holds the ball of your upper arm bone (humerus) in the socket of your shoulder blade (scapula).  CAUSES Injuries to your rotator cuff most commonly come from sports or activities that cause your arm to be moved repeatedly over your head. Examples of this include throwing, weight lifting, swimming, or racquet sports. Long lasting (chronic) irritation of your rotator cuff can cause soreness and swelling (inflammation), bursitis, and eventual damage to your tendons, such as a tear (rupture). SIGNS AND SYMPTOMS Acute rotator cuff tear:  Sudden  tearing sensation followed by severe pain shooting from your upper shoulder down your arm toward your elbow.  Decreased range of motion of your shoulder because of pain and muscle spasm.  Severe pain.  Inability to raise your arm out to the side because of pain and loss of muscle power (large tears). Chronic rotator cuff tear:  Pain that usually is worse at night and may interfere with sleep.  Gradual weakness and decreased shoulder motion as the pain worsens.  Decreased range of motion. Rotator cuff tendinitis:  Deep ache in your shoulder and the outside upper arm over your shoulder.  Pain that comes on gradually and becomes worse when lifting your arm to the side or turning it inward. DIAGNOSIS Rotator cuff injury is diagnosed through a medical history, physical exam, and imaging exam. The medical history helps determine the type of rotator cuff injury. Your health care provider will look at your injured shoulder, feel the injured area, and ask you to move your shoulder in different positions. X-ray exams typically are done to rule out other causes of shoulder pain, such as fractures. MRI is the exam of choice for the most severe shoulder injuries because the images show muscles and tendons.  TREATMENT  Chronic tear:  Medicine for pain, such as acetaminophen or ibuprofen.  Physical therapy and range-of-motion exercises may be helpful in maintaining shoulder function and strength.  Steroid injections into your shoulder  joint.  Surgical repair of the rotator cuff if the injury does not heal with noninvasive treatment. Acute tear:  Anti-inflammatory medicines such as ibuprofen and naproxen to help reduce pain and swelling.  A sling to help support your arm and rest your rotator cuff muscles. Long-term use of a sling is not advised. It may cause significant stiffening of the shoulder joint.  Surgery may be considered within a few weeks, especially in younger, active people, to  return the shoulder to full function.  Indications for surgical treatment include the following:  Age younger than 48 years.  Rotator cuff tears that are complete.  Physical therapy, rest, and anti-inflammatory medicines have been used for 6-8 weeks, with no improvement.  Employment or sporting activity that requires constant shoulder use. Tendinitis:  Anti-inflammatory medicines such as ibuprofen and naproxen to help reduce pain and swelling.  A sling to help support your arm and rest your rotator cuff muscles. Long-term use of a sling is not advised. It may cause significant stiffening of the shoulder joint.  Severe tendinitis may require:  Steroid injections into your shoulder joint.  Physical therapy.  Surgery. HOME CARE INSTRUCTIONS   Apply ice to your injury:  Put ice in a plastic bag.  Place a towel between your skin and the bag.  Leave the ice on for 20 minutes, 2-3 times a day.  If you have a shoulder immobilizer (sling and straps), wear it until told otherwise by your health care provider.  You may want to sleep on several pillows or in a recliner at night to lessen swelling and pain.  Only take over-the-counter or prescription medicines for pain, discomfort, or fever as directed by your health care provider.  Do simple hand squeezing exercises with a soft rubber ball to decrease hand swelling. SEEK MEDICAL CARE IF:   Your shoulder pain increases, or new pain or numbness develops in your arm, hand, or fingers.  Your hand or fingers are colder than your other hand. SEEK IMMEDIATE MEDICAL CARE IF:   Your arm, hand, or fingers are numb or tingling.  Your arm, hand, or fingers are increasingly swollen and painful, or they turn white or blue. MAKE SURE YOU:  Understand these instructions.  Will watch your condition.  Will get help right away if you are not doing well or get worse.   This information is not intended to replace advice given to you by your  health care provider. Make sure you discuss any questions you have with your health care provider.   Document Released: 11/11/2000 Document Revised: 11/19/2013 Document Reviewed: 06/26/2013 Elsevier Interactive Patient Education 2016 [employer]. Shoulder Arthroscopy, Care After Refer to this sheet in the next few weeks. These instructions provide you with information on caring for yourself after your procedure. Your health care provider may also give you more specific instructions. Your treatment has been planned according to current medical practices, but problems sometimes occur. Call your health care provider if you have any problems or questions after your procedure.  WHAT TO EXPECT AFTER THE PROCEDURE After your procedure, it is typical to have the following:  Pain at the site of the surgical cuts (incisions).  Stiffness in your shoulder. This should gradually decrease over time. Your health care provider may recommend physical therapy to help improve this.  Nausea, vomiting, or constipation. These symptoms can result from taking pain medicine after surgery.  Clear or red drainage from the incision sites. This is normal for a few days after  surgery.  Fatigue. HOME CARE INSTRUCTIONS  Take medicines only as directed by your health care provider.  Use a sling as directed.  There are many different ways to close and cover an incision, including stitches, skin glue, and adhesive strips. Follow your health care provider's instructions on:  Incision care.  Bandage (dressing) changes and removal.  Incision closure removal.  Apply ice to the injured area:  Put ice in a plastic bag.  Place a towel between your skin and the bag.  Leave the ice on for 20 minutes, 2-3 times a day.  If physical therapy and exercises are prescribed by your health care provider, do them as directed.  Keep all follow-up visits as directed by your health care provider. This is important. SEEK MEDICAL  CARE IF: You have a fever. SEEK IMMEDIATE MEDICAL CARE IF:  You have drainage, redness, swelling, or increasing pain at the incision site.  You notice a bad smell coming from the incision site or dressing.  Your incision site breaks open after your stitches or tape has been removed.   This information is not intended to replace advice given to you by your health care provider. Make sure you discuss any questions you have with your health care provider.   Document Released: 06/11/2014 Document Reviewed: 06/11/2014 Elsevier Interactive Patient Education 2016 [employer] PATIENT INSTRUCTIONS POST-ANESTHESIA  IMMEDIATELY FOLLOWING SURGERY:  Do not drive or operate machinery for the first twenty four hours after surgery.  Do not make any important decisions for twenty four hours after surgery or while taking narcotic pain medications or sedatives.  If you develop intractable nausea and vomiting or a severe headache please notify your doctor immediately.  FOLLOW-UP:  Please make an appointment with your surgeon as instructed. You do not need to follow up with anesthesia unless specifically instructed to do so.  WOUND CARE INSTRUCTIONS (if applicable):  Keep a dry clean dressing on the anesthesia/puncture wound site if there is drainage.  Once the wound has quit draining you may leave it open to air.  Generally you should leave the bandage intact for twenty four hours unless there is drainage.  If the epidural site drains for more than 36-48 hours please call the anesthesia department.  QUESTIONS?:  Please feel free to call your physician or the hospital operator if you have any questions, and they will be happy to assist you.

## 2016-10-04 ENCOUNTER — Encounter (HOSPITAL_COMMUNITY)
Admission: RE | Admit: 2016-10-04 | Discharge: 2016-10-04 | Disposition: A | Discharge status: Home / Self Care | Payer: Medicare Other | Source: Ambulatory Visit | Attending: Orthopedic Surgery | Admitting: Orthopedic Surgery

## 2016-10-04 ENCOUNTER — Encounter (HOSPITAL_COMMUNITY): Payer: Self-pay

## 2016-10-04 DIAGNOSIS — Z87891 Personal history of nicotine dependence: Secondary | ICD-10-CM | POA: Diagnosis not present

## 2016-10-04 DIAGNOSIS — R112 Nausea with vomiting, unspecified: Secondary | ICD-10-CM | POA: Diagnosis not present

## 2016-10-04 DIAGNOSIS — Z79899 Other long term (current) drug therapy: Secondary | ICD-10-CM | POA: Diagnosis not present

## 2016-10-04 DIAGNOSIS — X58XXXA Exposure to other specified factors, initial encounter: Secondary | ICD-10-CM | POA: Diagnosis not present

## 2016-10-04 DIAGNOSIS — S46011A Strain of muscle(s) and tendon(s) of the rotator cuff of right shoulder, initial encounter: Secondary | ICD-10-CM | POA: Diagnosis not present

## 2016-10-04 DIAGNOSIS — I10 Essential (primary) hypertension: Secondary | ICD-10-CM | POA: Diagnosis not present

## 2016-10-04 LAB — CBC WITH DIFFERENTIAL/PLATELET
BASOS ABS: 0.1 10*3/uL (ref 0.0–0.1)
Basophils Relative: 1 %
EOS ABS: 0.3 10*3/uL (ref 0.0–0.7)
Eosinophils Relative: 3 %
HCT: 40.5 % (ref 36.0–46.0)
HEMOGLOBIN: 13.3 g/dL (ref 12.0–15.0)
LYMPHS ABS: 4.2 10*3/uL — AB (ref 0.7–4.0)
Lymphocytes Relative: 45 %
MCH: 25.9 pg — AB (ref 26.0–34.0)
MCHC: 32.8 g/dL (ref 30.0–36.0)
MCV: 78.9 fL (ref 78.0–100.0)
Monocytes Absolute: 0.5 10*3/uL (ref 0.1–1.0)
Monocytes Relative: 5 %
NEUTROS PCT: 46 %
Neutro Abs: 4.5 10*3/uL (ref 1.7–7.7)
PLATELETS: 305 10*3/uL (ref 150–400)
RBC: 5.13 MIL/uL — AB (ref 3.87–5.11)
RDW: 14.1 % (ref 11.5–15.5)
WBC: 9.5 10*3/uL (ref 4.0–10.5)

## 2016-10-04 LAB — BASIC METABOLIC PANEL
ANION GAP: 9 (ref 5–15)
BUN: 14 mg/dL (ref 6–20)
CHLORIDE: 102 mmol/L (ref 101–111)
CO2: 26 mmol/L (ref 22–32)
Calcium: 9.4 mg/dL (ref 8.9–10.3)
Creatinine, Ser: 0.99 mg/dL (ref 0.44–1.00)
GFR, EST NON AFRICAN AMERICAN: 56 mL/min — AB (ref >60)
Glucose, Bld: 109 mg/dL — ABNORMAL HIGH (ref 65–99)
Potassium: 4 mmol/L (ref 3.5–5.1)
SODIUM: 137 mmol/L (ref 135–145)

## 2016-10-05 NOTE — H&P (Signed)
Admission history and physical  Chief Complaint  Patient presents with  . Shoulder Injury      RIGHT ROTATOR CUFF TEAR    HPI 70 year old female presents for possible rotator cuff repair. Patient followed by Dr. Luna Glasgow found to have rotator cuff tear on MRI she has pain loss of motion and weakness area she has night pain. She can't functionally do activities of daily living such as getting dressed.   Prior treatment included injection, pain medicine (not tolerated), physical therapy. She's had pain for a year may have started when she went to help her grandson in [place]  Constitutional: Negative for chills, fever and weight loss.  Respiratory: Negative for shortness of breath.   Cardiovascular: Negative for chest pain.  Neurological: Negative for tingling.  Review of Systems  All other systems reviewed and are negative.         Past Medical History:  Diagnosis Date  . Hypertension             Past Surgical History:  Procedure Laterality Date  . ABDOMINAL HYSTERECTOMY   1988  . COLONOSCOPY N/A 08/11/2016    Procedure: COLONOSCOPY;  Surgeon: Rogene Houston, MD;  Location[facility];  Service: Endoscopy;  Laterality: N/A;  730  . ELBOW FRACTURE SURGERY      . KNEE ARTHROSCOPY             Family History  Problem Relation Age of Onset  . Cancer Maternal Grandmother           Social History  Substance Use Topics  . Smoking status: Former Smoker      Packs/day: 0.75      Years: 15.00      Types: Cigarettes      Quit date: 11/28/1983  . Smokeless tobacco: Never Used  . Alcohol use No    Active Medications  No outpatient prescriptions have been marked as taking for the 09/16/16 encounter (Office Visit) with Carole Civil, MD.        BP (!) 142/90   Pulse 83   Ht 5\' 4"  (1.626 m)   Wt 182 lb (82.6 kg)   BMI 31.24 kg/m    Physical Exam  Constitutional: She is oriented to person, place, and time. She appears well-developed and  well-nourished. No distress.  Cardiovascular: Normal rate and intact distal pulses.   Neurological: She is alert and oriented to person, place, and time. She has normal reflexes. She exhibits normal muscle tone. Coordination normal.  Skin: Skin is warm and dry. No rash noted. She is not diaphoretic. No erythema. No pallor.  Psychiatric: She has a normal mood and affect. Her behavior is normal. Judgment and thought content normal.      Ortho Exam   Left shoulder no tenderness or swelling, full range of motion. Shoulder stable in abduction external rotation. Normal strength in rotator cuff. Scans intact. Distal pulses are normal in the radial and ulnar artery. Lymph nodes on the left side are normal sensation remains intact no pathologic reflexes   Right shoulder external rotation 50 passive flexion 120 active flexion 90 and painful. No instability her supraspinatus tendon is very weak she has a positive drop test skin is intact there is no instability pulses are normal lymph nodes are negative sensation is normal reflexes are normal. ASSESSMENT: My personal interpretation of the images:  MRI shows a retracted supraspinatus tendon as well as acromioclavicular arthritis   Official MRI report  IMPRESSION: 1. Large full-thickness retracted supraspinous tendon tear. 2. Significant tendinopathy and articular surface tearing involving the infraspinatus tendon. 3. 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 4. Intact long head biceps tendon and glenoid labrum.     Electronically Signed   By: Marijo Sanes M.D.   On: 09/04/2016 20:27    At one year of symptoms she probably had a progressively tearing going on in the supraspinatus tendon   We discussed possibility of nonhealing, one year of recovery and she will have some help at home from friends   Plan open right rotator cuff repair (included in this procedure is  evaluation of the distal clavicle for resection)

## 2016-10-06 ENCOUNTER — Encounter (HOSPITAL_COMMUNITY)
Admission: RE | Disposition: A | Discharge status: Home / Self Care | Payer: Self-pay | Source: Ambulatory Visit | Attending: Orthopedic Surgery

## 2016-10-06 ENCOUNTER — Observation Stay (HOSPITAL_COMMUNITY)
Admission: RE | Admit: 2016-10-06 | Discharge: 2016-10-07 | Disposition: A | Discharge status: Home / Self Care | Payer: Medicare Other | Source: Ambulatory Visit | Attending: Orthopedic Surgery | Admitting: Orthopedic Surgery

## 2016-10-06 ENCOUNTER — Ambulatory Visit (HOSPITAL_COMMUNITY): Payer: Medicare Other | Admitting: Anesthesiology

## 2016-10-06 ENCOUNTER — Encounter (HOSPITAL_COMMUNITY): Payer: Self-pay | Admitting: Anesthesiology

## 2016-10-06 DIAGNOSIS — M75101 Unspecified rotator cuff tear or rupture of right shoulder, not specified as traumatic: Secondary | ICD-10-CM | POA: Diagnosis not present

## 2016-10-06 DIAGNOSIS — Z87891 Personal history of nicotine dependence: Secondary | ICD-10-CM | POA: Insufficient documentation

## 2016-10-06 DIAGNOSIS — M75122 Complete rotator cuff tear or rupture of left shoulder, not specified as traumatic: Secondary | ICD-10-CM | POA: Diagnosis not present

## 2016-10-06 DIAGNOSIS — I10 Essential (primary) hypertension: Secondary | ICD-10-CM | POA: Diagnosis not present

## 2016-10-06 DIAGNOSIS — Z9889 Other specified postprocedural states: Secondary | ICD-10-CM

## 2016-10-06 DIAGNOSIS — Z79899 Other long term (current) drug therapy: Secondary | ICD-10-CM | POA: Diagnosis not present

## 2016-10-06 DIAGNOSIS — R112 Nausea with vomiting, unspecified: Secondary | ICD-10-CM | POA: Insufficient documentation

## 2016-10-06 DIAGNOSIS — S46011A Strain of muscle(s) and tendon(s) of the rotator cuff of right shoulder, initial encounter: Principal | ICD-10-CM | POA: Insufficient documentation

## 2016-10-06 DIAGNOSIS — X58XXXA Exposure to other specified factors, initial encounter: Secondary | ICD-10-CM | POA: Insufficient documentation

## 2016-10-06 HISTORY — PX: SHOULDER OPEN ROTATOR CUFF REPAIR: SHX2407

## 2016-10-06 SURGERY — REPAIR, ROTATOR CUFF, OPEN
Anesthesia: General | Site: Shoulder | Laterality: Right

## 2016-10-06 MED ORDER — TRAZODONE HCL 50 MG PO TABS: 25.0000 mg | ORAL_TABLET | Freq: Every evening | ORAL | Status: DC | PRN

## 2016-10-06 MED ORDER — PHENYLEPHRINE HCL 10 MG/ML IJ SOLN
INTRAMUSCULAR | Status: DC | PRN
  Administered 2016-10-06: 80 ug via INTRAVENOUS

## 2016-10-06 MED ORDER — FENTANYL CITRATE (PF) 250 MCG/5ML IJ SOLN
INTRAMUSCULAR | Status: AC
  Filled 2016-10-06: qty 5

## 2016-10-06 MED ORDER — ACETAMINOPHEN 650 MG RE SUPP: 650.0000 mg | Freq: Four times a day (QID) | RECTAL | Status: DC | PRN

## 2016-10-06 MED ORDER — CHLORHEXIDINE GLUCONATE 4 % EX LIQD: 60.0000 mL | Freq: Once | CUTANEOUS | Status: DC

## 2016-10-06 MED ORDER — CEFAZOLIN SODIUM-DEXTROSE 2-4 GM/100ML-% IV SOLN
INTRAVENOUS | Status: AC
Start: 2016-10-06 — End: 2016-10-06
  Filled 2016-10-06: qty 100

## 2016-10-06 MED ORDER — DEXAMETHASONE SODIUM PHOSPHATE 4 MG/ML IJ SOLN
INTRAMUSCULAR | Status: AC
  Filled 2016-10-06: qty 1

## 2016-10-06 MED ORDER — PROPOFOL 10 MG/ML IV BOLUS
INTRAVENOUS | Status: AC
  Filled 2016-10-06: qty 40

## 2016-10-06 MED ORDER — MIDAZOLAM HCL 2 MG/2ML IJ SOLN
1.0000 mg | INTRAMUSCULAR | Status: DC | PRN
  Administered 2016-10-06: 2 mg via INTRAVENOUS

## 2016-10-06 MED ORDER — METHOCARBAMOL 1000 MG/10ML IJ SOLN
500.0000 mg | Freq: Once | INTRAVENOUS | Status: AC
  Administered 2016-10-06: 500 mg via INTRAVENOUS
  Filled 2016-10-06: qty 5

## 2016-10-06 MED ORDER — BUPIVACAINE-EPINEPHRINE (PF) 0.5% -1:200000 IJ SOLN
INTRAMUSCULAR | Status: DC | PRN
  Administered 2016-10-06: 62 mL via PERINEURAL

## 2016-10-06 MED ORDER — FENTANYL CITRATE (PF) 100 MCG/2ML IJ SOLN
INTRAMUSCULAR | Status: DC | PRN
  Administered 2016-10-06 (×6): 50 ug via INTRAVENOUS

## 2016-10-06 MED ORDER — 0.9 % SODIUM CHLORIDE (POUR BTL) OPTIME
TOPICAL | Status: DC | PRN
  Administered 2016-10-06: 1000 mL

## 2016-10-06 MED ORDER — CELECOXIB 400 MG PO CAPS
400.0000 mg | ORAL_CAPSULE | Freq: Once | ORAL | Status: AC
  Administered 2016-10-06: 400 mg via ORAL

## 2016-10-06 MED ORDER — NEOSTIGMINE METHYLSULFATE 10 MG/10ML IV SOLN
INTRAVENOUS | Status: AC
  Filled 2016-10-06: qty 1

## 2016-10-06 MED ORDER — ONDANSETRON HCL 4 MG/2ML IJ SOLN
4.0000 mg | Freq: Four times a day (QID) | INTRAMUSCULAR | Status: DC | PRN
  Administered 2016-10-06: 4 mg via INTRAVENOUS
  Filled 2016-10-06: qty 2

## 2016-10-06 MED ORDER — SODIUM CHLORIDE 0.9% FLUSH
INTRAVENOUS | Status: AC
  Filled 2016-10-06: qty 10

## 2016-10-06 MED ORDER — SUCCINYLCHOLINE CHLORIDE 20 MG/ML IJ SOLN
INTRAMUSCULAR | Status: AC
  Filled 2016-10-06: qty 1

## 2016-10-06 MED ORDER — PREGABALIN 50 MG PO CAPS
ORAL_CAPSULE | ORAL | Status: AC
  Filled 2016-10-06: qty 1

## 2016-10-06 MED ORDER — ONDANSETRON HCL 4 MG/2ML IJ SOLN
INTRAMUSCULAR | Status: AC
  Filled 2016-10-06: qty 2

## 2016-10-06 MED ORDER — LIDOCAINE HCL (PF) 1 % IJ SOLN
INTRAMUSCULAR | Status: AC
  Filled 2016-10-06: qty 5

## 2016-10-06 MED ORDER — SODIUM CHLORIDE 0.9 % IJ SOLN
INTRAMUSCULAR | Status: AC
  Filled 2016-10-06: qty 10

## 2016-10-06 MED ORDER — GLYCOPYRROLATE 0.2 MG/ML IJ SOLN
INTRAMUSCULAR | Status: DC | PRN
  Administered 2016-10-06: 0.6 mg via INTRAVENOUS

## 2016-10-06 MED ORDER — FENTANYL CITRATE (PF) 100 MCG/2ML IJ SOLN
INTRAMUSCULAR | Status: AC
  Filled 2016-10-06: qty 2

## 2016-10-06 MED ORDER — PHENYLEPHRINE 40 MCG/ML (10ML) SYRINGE FOR IV PUSH (FOR BLOOD PRESSURE SUPPORT)
PREFILLED_SYRINGE | INTRAVENOUS | Status: AC
  Filled 2016-10-06: qty 10

## 2016-10-06 MED ORDER — PROMETHAZINE HCL 12.5 MG PO TABS: 12.5000 mg | ORAL_TABLET | Freq: Four times a day (QID) | ORAL | Status: DC | PRN

## 2016-10-06 MED ORDER — KETOROLAC TROMETHAMINE 15 MG/ML IJ SOLN: 15.0000 mg | Freq: Four times a day (QID) | INTRAMUSCULAR | Status: DC | PRN

## 2016-10-06 MED ORDER — HYDROCODONE-ACETAMINOPHEN 5-325 MG PO TABS
1.0000 | ORAL_TABLET | ORAL | Status: DC | PRN
  Administered 2016-10-06 – 2016-10-07 (×4): 2 via ORAL
  Filled 2016-10-06 (×4): qty 2

## 2016-10-06 MED ORDER — HYDROMORPHONE HCL 1 MG/ML IJ SOLN
0.2500 mg | INTRAMUSCULAR | Status: DC | PRN
  Administered 2016-10-06: 0.5 mg via INTRAVENOUS
  Filled 2016-10-06: qty 0.5

## 2016-10-06 MED ORDER — ONDANSETRON HCL 4 MG/2ML IJ SOLN
4.0000 mg | Freq: Once | INTRAMUSCULAR | Status: AC
  Administered 2016-10-06: 4 mg via INTRAVENOUS

## 2016-10-06 MED ORDER — PROMETHAZINE HCL 25 MG/ML IJ SOLN
6.2500 mg | Freq: Once | INTRAMUSCULAR | Status: AC
  Administered 2016-10-06: 6.25 mg via INTRAVENOUS

## 2016-10-06 MED ORDER — CELECOXIB 400 MG PO CAPS
ORAL_CAPSULE | ORAL | Status: AC
  Filled 2016-10-06: qty 1

## 2016-10-06 MED ORDER — LACTATED RINGERS IV SOLN
INTRAVENOUS | Status: DC
  Administered 2016-10-06: 13:00:00 via INTRAVENOUS

## 2016-10-06 MED ORDER — PROMETHAZINE HCL 25 MG/ML IJ SOLN
INTRAMUSCULAR | Status: AC
  Filled 2016-10-06: qty 1

## 2016-10-06 MED ORDER — DOCUSATE SODIUM 100 MG PO CAPS
100.0000 mg | ORAL_CAPSULE | Freq: Two times a day (BID) | ORAL | Status: DC
  Administered 2016-10-06 – 2016-10-07 (×3): 100 mg via ORAL
  Filled 2016-10-06 (×3): qty 1

## 2016-10-06 MED ORDER — HYDROCODONE-ACETAMINOPHEN 10-325 MG PO TABS
1.0000 | ORAL_TABLET | Freq: Once | ORAL | Status: AC
  Administered 2016-10-06: 1 via ORAL

## 2016-10-06 MED ORDER — LACTATED RINGERS IV SOLN
INTRAVENOUS | Status: DC
Start: 2016-10-06 — End: 2016-10-06
  Administered 2016-10-06 (×2): via INTRAVENOUS

## 2016-10-06 MED ORDER — NEOSTIGMINE METHYLSULFATE 10 MG/10ML IV SOLN
INTRAVENOUS | Status: DC | PRN
  Administered 2016-10-06: 4 mg via INTRAVENOUS

## 2016-10-06 MED ORDER — ROCURONIUM BROMIDE 50 MG/5ML IV SOLN
INTRAVENOUS | Status: AC
  Filled 2016-10-06: qty 1

## 2016-10-06 MED ORDER — EPHEDRINE SULFATE 50 MG/ML IJ SOLN
INTRAMUSCULAR | Status: DC | PRN
  Administered 2016-10-06: 5 mg via INTRAVENOUS
  Administered 2016-10-06: 10 mg via INTRAVENOUS
  Administered 2016-10-06: 5 mg via INTRAVENOUS

## 2016-10-06 MED ORDER — HYDROCODONE-ACETAMINOPHEN 10-325 MG PO TABS
ORAL_TABLET | ORAL | Status: AC
  Filled 2016-10-06: qty 1

## 2016-10-06 MED ORDER — LORATADINE 10 MG PO TABS: 10.0000 mg | ORAL_TABLET | Freq: Every evening | ORAL | Status: DC | PRN

## 2016-10-06 MED ORDER — BUPIVACAINE HCL (PF) 0.5 % IJ SOLN
INTRAMUSCULAR | Status: AC
  Filled 2016-10-06: qty 60

## 2016-10-06 MED ORDER — MIDAZOLAM HCL 2 MG/2ML IJ SOLN
INTRAMUSCULAR | Status: AC
  Filled 2016-10-06: qty 2

## 2016-10-06 MED ORDER — DEXAMETHASONE SODIUM PHOSPHATE 4 MG/ML IJ SOLN
4.0000 mg | Freq: Once | INTRAMUSCULAR | Status: AC
  Administered 2016-10-06: 4 mg via INTRAVENOUS

## 2016-10-06 MED ORDER — CEFAZOLIN SODIUM-DEXTROSE 2-4 GM/100ML-% IV SOLN
2.0000 g | INTRAVENOUS | Status: AC
  Administered 2016-10-06: 2 g via INTRAVENOUS

## 2016-10-06 MED ORDER — LOSARTAN POTASSIUM 50 MG PO TABS
100.0000 mg | ORAL_TABLET | Freq: Every day | ORAL | Status: DC
Start: 2016-10-07 — End: 2016-10-07
  Administered 2016-10-07: 100 mg via ORAL
  Filled 2016-10-06: qty 2

## 2016-10-06 MED ORDER — ACETAMINOPHEN 325 MG PO TABS: 650.0000 mg | ORAL_TABLET | Freq: Four times a day (QID) | ORAL | Status: DC | PRN

## 2016-10-06 MED ORDER — LIDOCAINE HCL (CARDIAC) 20 MG/ML IV SOLN
INTRAVENOUS | Status: DC | PRN
  Administered 2016-10-06: 25 mg via INTRAVENOUS

## 2016-10-06 MED ORDER — PROPOFOL 10 MG/ML IV BOLUS
INTRAVENOUS | Status: DC | PRN
  Administered 2016-10-06: 120 mg via INTRAVENOUS
  Administered 2016-10-06: 20 mg via INTRAVENOUS

## 2016-10-06 MED ORDER — EPINEPHRINE PF 1 MG/ML IJ SOLN
INTRAMUSCULAR | Status: AC
  Filled 2016-10-06: qty 2

## 2016-10-06 MED ORDER — EPHEDRINE SULFATE 50 MG/ML IJ SOLN
INTRAMUSCULAR | Status: AC
  Filled 2016-10-06: qty 1

## 2016-10-06 MED ORDER — PREGABALIN 50 MG PO CAPS
50.0000 mg | ORAL_CAPSULE | Freq: Once | ORAL | Status: AC
  Administered 2016-10-06: 50 mg via ORAL

## 2016-10-06 MED ORDER — ROCURONIUM BROMIDE 100 MG/10ML IV SOLN
INTRAVENOUS | Status: DC | PRN
  Administered 2016-10-06: 10 mg via INTRAVENOUS
  Administered 2016-10-06: 30 mg via INTRAVENOUS

## 2016-10-06 MED ORDER — GLYCOPYRROLATE 0.2 MG/ML IJ SOLN
INTRAMUSCULAR | Status: AC
  Filled 2016-10-06: qty 3

## 2016-10-06 SURGICAL SUPPLY — 60 items
ANCH SUT 2 CRKSCW 15.5X6.5 (Anchor) ×2 IMPLANT
ANCH SUT PUSHLCK 24X4.5 STRL (Orthopedic Implant) ×1 IMPLANT
ANCHOR CORKSCREW 6.5 FIBERWIRE (Anchor) ×4 IMPLANT
BIT DRILL 2.0MX128MM (BIT) IMPLANT
BLADE HEX COATED 2.75 (ELECTRODE) ×3 IMPLANT
BLADE OSC/SAGITTAL MD 9X18.5 (BLADE) ×3 IMPLANT
BUR FAST CUTTING (BURR)
BUR ROUND 5.0 (BURR) IMPLANT
BUR SRG 54X4.7X12 FLUT (BURR) IMPLANT
BURR SRG 54X4.7X12 FLUT (BURR)
CHLORAPREP W/TINT 26ML (MISCELLANEOUS) ×3 IMPLANT
CLOTH BEACON ORANGE TIMEOUT ST (SAFETY) ×3 IMPLANT
COVER LIGHT HANDLE STERIS (MISCELLANEOUS) ×6 IMPLANT
COVER PROBE W GEL 5X96 (DRAPES) ×1 IMPLANT
DRAPE PROXIMA HALF (DRAPES) ×3 IMPLANT
DRESSING ALLEVYN BORDER 5X5 (GAUZE/BANDAGES/DRESSINGS) ×3 IMPLANT
ELECT REM PT RETURN 9FT ADLT (ELECTROSURGICAL) ×3
ELECTRODE REM PT RTRN 9FT ADLT (ELECTROSURGICAL) ×1 IMPLANT
GLOVE BIOGEL PI IND STRL 7.0 (GLOVE) ×1 IMPLANT
GLOVE BIOGEL PI INDICATOR 7.0 (GLOVE) ×6
GLOVE ECLIPSE 6.5 STRL STRAW (GLOVE) ×4 IMPLANT
GLOVE EXAM NITRILE MD LF STRL (GLOVE) ×2 IMPLANT
GLOVE SKINSENSE NS SZ8.0 LF (GLOVE) ×2
GLOVE SKINSENSE STRL SZ8.0 LF (GLOVE) ×1 IMPLANT
GLOVE SS N UNI LF 8.5 STRL (GLOVE) ×3 IMPLANT
GOWN STRL REUS W/TWL LRG LVL3 (GOWN DISPOSABLE) ×8 IMPLANT
GOWN STRL REUS W/TWL XL LVL3 (GOWN DISPOSABLE) ×3 IMPLANT
INST SET MINOR BONE (KITS) ×3 IMPLANT
KIT BLADEGUARD II DBL (SET/KITS/TRAYS/PACK) ×3 IMPLANT
KIT ROOM TURNOVER APOR (KITS) ×3 IMPLANT
MANIFOLD NEPTUNE II (INSTRUMENTS) ×3 IMPLANT
MARKER SKIN DUAL TIP RULER LAB (MISCELLANEOUS) ×3 IMPLANT
NDL HYPO 21X1.5 SAFETY (NEEDLE) ×1 IMPLANT
NDL MA TROC 1/2 (NEEDLE) IMPLANT
NDL MAYO 6 CRC TAPER PT (NEEDLE) IMPLANT
NDL SCORPION (NEEDLE) IMPLANT
NEEDLE HYPO 21X1.5 SAFETY (NEEDLE) ×3 IMPLANT
NEEDLE MA TROC 1/2 (NEEDLE) IMPLANT
NEEDLE MAYO 6 CRC TAPER PT (NEEDLE) ×3 IMPLANT
NEEDLE SCORPION (NEEDLE) ×6 IMPLANT
NS IRRIG 1000ML POUR BTL (IV SOLUTION) ×3 IMPLANT
PACK TOTAL JOINT (CUSTOM PROCEDURE TRAY) ×3 IMPLANT
PAD ARMBOARD 7.5X6 YLW CONV (MISCELLANEOUS) ×3 IMPLANT
PASSER SUT CAPTURE FIRST (SUTURE) IMPLANT
PASSER SUT SWANSON 36MM LOOP (INSTRUMENTS) ×3 IMPLANT
PUSHLOCK PEEK 4.5X24 (Orthopedic Implant) ×2 IMPLANT
RASP SM TEAR CROSS CUT (RASP) ×2 IMPLANT
SET BASIN LINEN APH (SET/KITS/TRAYS/PACK) ×3 IMPLANT
SLING ARM IMMOBILIZER LRG (SOFTGOODS) IMPLANT
SLING ARM IMMOBILIZER MED (SOFTGOODS) ×2 IMPLANT
STAPLER VISISTAT 35W (STAPLE) ×2 IMPLANT
SUT BONE WAX W31G (SUTURE) IMPLANT
SUT ETHIBOND NAB OS 4 #2 30IN (SUTURE) ×13 IMPLANT
SUT ETHILON 3 0 FSL (SUTURE) IMPLANT
SUT MON AB 0 CT1 (SUTURE) ×3 IMPLANT
SUT MON AB 2-0 CT1 36 (SUTURE) ×1 IMPLANT
SUT PROLENE 3 0 PS 1 (SUTURE) IMPLANT
SYR 30ML LL (SYRINGE) ×3 IMPLANT
SYR BULB IRRIGATION 50ML (SYRINGE) ×3 IMPLANT
YANKAUER SUCT 12FT TUBE ARGYLE (SUCTIONS) ×1 IMPLANT

## 2016-10-06 NOTE — Progress Notes (Signed)
Progress note  70 year old female head on count K right rotator cuff repair via open technique. In the postop ears she developed nausea and vomiting which was not well controlled. She is admitted for same day 24-hour observation to control nausea vomiting  We will start Phenergan and Zofran.

## 2016-10-06 NOTE — Anesthesia Procedure Notes (Addendum)
Procedure Name: Intubation Date/Time: 10/06/2016 8:41 AM Performed by: Andree Elk, Ayren Zumbro A Pre-anesthesia Checklist: Patient identified, Patient being monitored, Timeout performed, Emergency Drugs available and Suction available Patient Re-evaluated:Patient Re-evaluated prior to inductionOxygen Delivery Method: Circle System Utilized Preoxygenation: Pre-oxygenation with 100% oxygen Intubation Type: IV induction Ventilation: Mask ventilation without difficulty Laryngoscope Size: Miller and 3 Grade View: Grade I Tube type: Oral Tube size: 7.0 mm Number of attempts: 1 Airway Equipment and Method: Stylet Placement Confirmation: ETT inserted through vocal cords under direct vision,  positive ETCO2 and breath sounds checked- equal and bilateral Secured at: 21 cm Tube secured with: Tape Dental Injury: Teeth and Oropharynx as per pre-operative assessment

## 2016-10-06 NOTE — Op Note (Signed)
10/06/2016  10:30 AM  PATIENT:  Tammy [name]  70 y.o. female  PRE-OPERATIVE DIAGNOSIS:  right rotator cuff tear  POST-OPERATIVE DIAGNOSIS:  right rotator cuff tear  Surgical findings  The acromioclavicular joint was normal.  The supraspinatus tendon was torn with retraction.  The biceps tendon was intact  PROCEDURE:  Procedure(s): OPEN ROTATOR CUFF REPAIR RIGHT SHOULDER (Right)   SURGEON:  Surgeon(s) and Role:    * Carole Civil, MD - Primary  PHYSICIAN ASSISTANT:   ASSISTANTS: Simonne Maffucci   ANESTHESIA:   general  EBL:  Total I/O In: 1000 [I.V.:1000] Out: 20 [Blood:20]  BLOOD ADMINISTERED:none  DRAINS: none   LOCAL MEDICATIONS USED:  MARCAINE with epinephrine   and Amount: 60 ml  SPECIMEN:  No Specimen  DISPOSITION OF SPECIMEN:  N/A  COUNTS:  YES  TOURNIQUET:  * No tourniquets in log *  DICTATION: .Dragon Dictation  PLAN OF CARE: Discharge to home after PACU  PATIENT DISPOSITION:  PACU - hemodynamically stable.   Delay start of Pharmacological VTE agent (>24hrs) due to surgical blood loss or risk of bleeding: not applicable  Procedure was done as follows  Brief history this 70 year old female had atraumatic rupture of the rotator cuff of the right shoulder. She failed conservative treatment and had an MRI which showed a torn rotator cuff.  We evaluated her preoperatively and found her to be stable and ready for surgery. We marked the right shoulder after review of the records radiographs  She was taken to the operating room where she had general anesthesia. She was placed in the modified beachchair position on a regular bed. A sandbag was placed on the right shoulder.  She was prepped with ChloraPrep. She was draped sterilely. Timeout was taken appropriately.  The incision was made between the middle and anterior deltoid. Subcutaneous tissue was divided. Blunt finger dissection was used to undermine full-thickness skin flap both medially and  laterally. The deltoid was split up to the acromion and before meals joint. The acromioclavicular joint was evaluated and found to be normal on its undersurface. There was no significant spur over the acromion but we did a mild acromioplasty with a power rasp. We then thoroughly irrigated the joint and evaluated the rotator cuff tear  The supraspinatus was torn and retracted. The infraspinatus was intact. The humeral head was evaluated the greater tuberosity was debrided.  Margin convergence was used to close the rotator cuff defect using 2 #2 Ethibond sutures. We then placed 2, 6.5 corkscrew anchors and used all 4 sutures and then used 4 suture strands to place a 4.5 push lock. This closed the defect restore the rotator cuff to its anatomic footprint.  We irrigated the wound and to the shoulder through a range of motion and with the arm at her side is no tension on the rotator cuff.  We irrigated and closed with #2 Ethibond suture in the rotator cuff split as well as 0 Monocryl suture and the subcutaneous level. We injected 60 mL of Marcaine subcutaneously and beneath the acromion.  We then closed with staples. We placed her in a shoulder immobilizer.  The plan is for discharge to home  [phone]

## 2016-10-06 NOTE — Interval H&P Note (Signed)
History and Physical Interval Note:  10/06/2016 8:25 AM  Tammy [name]  has presented today for surgery, with the diagnosis of right rotator cuff tear  The various methods of treatment have been discussed with the patient and family. After consideration of risks, benefits and other options for treatment, the patient has consented to  Procedure(s) with comments: [place] (Right) - pt knows to arrive at 8:40 as a surgical intervention .  The patient's history has been reviewed, patient examined, no change in status, stable for surgery.  I have reviewed the patient's chart and labs.  Questions were answered to the patient's satisfaction.     Arther Abbott

## 2016-10-06 NOTE — Anesthesia Preprocedure Evaluation (Signed)
Anesthesia Evaluation  Patient identified by MRN, date of birth, ID band Patient awake    Reviewed: Allergy & Precautions, NPO status , Patient's Chart, lab work & pertinent test results  Airway Mallampati: II  TM Distance: >3 FB     Dental  (+) Partial Upper   Pulmonary former smoker,    breath sounds clear to auscultation       Cardiovascular hypertension, Pt. on medications  Rhythm:Regular Rate:Normal     Neuro/Psych    GI/Hepatic negative GI ROS,   Endo/Other    Renal/GU      Musculoskeletal   Abdominal   Peds  Hematology   Anesthesia Other Findings   Reproductive/Obstetrics                             Anesthesia Physical Anesthesia Plan  ASA: II  Anesthesia Plan: General   Post-op Pain Management:    Induction: Intravenous  Airway Management Planned: Oral ETT  Additional Equipment:   Intra-op Plan:   Post-operative Plan: Extubation in OR  Informed Consent: I have reviewed the patients History and Physical, chart, labs and discussed the procedure including the risks, benefits and alternatives for the proposed anesthesia with the patient or authorized representative who has indicated his/her understanding and acceptance.     Plan Discussed with:   Anesthesia Plan Comments:         Anesthesia Quick Evaluation

## 2016-10-06 NOTE — Transfer of Care (Signed)
Immediate Anesthesia Transfer of Care Note  Patient: Tammy [name]  Procedure(s) Performed: Procedure(s): OPEN ROTATOR CUFF REPAIR RIGHT SHOULDER (Right)  Patient Location: PACU  Anesthesia Type:General  Level of Consciousness: awake, oriented and patient cooperative  Airway & Oxygen Therapy: Patient Spontanous Breathing and Patient connected to face mask oxygen  Post-op Assessment: Report given to RN and Post -op Vital signs reviewed and stable  Post vital signs: Reviewed and stable  Last Vitals:  Vitals:   10/06/16 0801 10/06/16 0813  BP:  140/60  Resp: 18 20  Temp:  36.8 C    Last Pain:  Vitals:   10/06/16 0813  TempSrc: Oral  PainSc:       Patients Stated Pain Goal: 0 ([id number])  Complications: No apparent anesthesia complications

## 2016-10-06 NOTE — Anesthesia Postprocedure Evaluation (Signed)
Anesthesia Post Note  Patient: Tammy [name]  Procedure(s) Performed: Procedure(s) (LRB): OPEN ROTATOR CUFF REPAIR RIGHT SHOULDER (Right)  Patient location during evaluation: PACU Anesthesia Type: General Level of consciousness: awake and alert and oriented Pain management: pain level controlled Vital Signs Assessment: post-procedure vital signs reviewed and stable Respiratory status: spontaneous breathing and respiratory function stable Cardiovascular status: stable : some nausea; being treated with Zofran and phenergan. Anesthetic complications: no    Last Vitals:  Vitals:   10/06/16 0813 10/06/16 1041  BP: 140/60 (!) 157/57  Resp: 20 20  Temp: 36.8 C 36.7 C    Last Pain:  Vitals:   10/06/16 1041  TempSrc:   PainSc: 0-No pain                 Magdala Brahmbhatt A

## 2016-10-06 NOTE — Brief Op Note (Signed)
10/06/2016  10:30 AM  PATIENT:  Tammy [name]  70 y.o. female  PRE-OPERATIVE DIAGNOSIS:  right rotator cuff tear  POST-OPERATIVE DIAGNOSIS:  right rotator cuff tear  Surgical findings  The acromioclavicular joint was normal.  The supraspinatus tendon was torn with retraction.  The biceps tendon was intact  PROCEDURE:  Procedure(s): OPEN ROTATOR CUFF REPAIR RIGHT SHOULDER (Right)   SURGEON:  Surgeon(s) and Role:    * Carole Civil, MD - Primary  PHYSICIAN ASSISTANT:   ASSISTANTS: Simonne Maffucci   ANESTHESIA:   general  EBL:  Total I/O In: 1000 [I.V.:1000] Out: 20 [Blood:20]  BLOOD ADMINISTERED:none  DRAINS: none   LOCAL MEDICATIONS USED:  MARCAINE with epinephrine   and Amount: 60 ml  SPECIMEN:  No Specimen  DISPOSITION OF SPECIMEN:  N/A  COUNTS:  YES  TOURNIQUET:  * No tourniquets in log *  DICTATION: .Dragon Dictation  PLAN OF CARE: Discharge to home after PACU  PATIENT DISPOSITION:  PACU - hemodynamically stable.   Delay start of Pharmacological VTE agent (>24hrs) due to surgical blood loss or risk of bleeding: not applicable  Procedure was done as follows  Brief history this 70 year old female had atraumatic rupture of the rotator cuff of the right shoulder. She failed conservative treatment and had an MRI which showed a torn rotator cuff.  We evaluated her preoperatively and found her to be stable and ready for surgery. We marked the right shoulder after review of the records radiographs  She was taken to the operating room where she had general anesthesia. She was placed in the modified beachchair position on a regular bed. A sandbag was placed on the right shoulder.  She was prepped with ChloraPrep. She was draped sterilely. Timeout was taken appropriately.  The incision was made between the middle and anterior deltoid. Subcutaneous tissue was divided. Blunt finger dissection was used to undermine full-thickness skin flap both medially and  laterally. The deltoid was split up to the acromion and before meals joint. The acromioclavicular joint was evaluated and found to be normal on its undersurface. There was no significant spur over the acromion but we did a mild acromioplasty with a power rasp. We then thoroughly irrigated the joint and evaluated the rotator cuff tear  The supraspinatus was torn and retracted. The infraspinatus was intact. The humeral head was evaluated the greater tuberosity was debrided.  Margin convergence was used to close the rotator cuff defect using 2 #2 Ethibond sutures. We then placed 2, 6.5 corkscrew anchors and used all 4 sutures and then used 4 suture strands to place a 4.5 push lock. This closed the defect restore the rotator cuff to its anatomic footprint.  We irrigated the wound and to the shoulder through a range of motion and with the arm at her side is no tension on the rotator cuff.  We irrigated and closed with #2 Ethibond suture in the rotator cuff split as well as 0 Monocryl suture and the subcutaneous level. We injected 60 mL of Marcaine subcutaneously and beneath the acromion.  We then closed with staples. We placed her in a shoulder immobilizer.  The plan is for discharge to home  [phone]

## 2016-10-07 ENCOUNTER — Encounter (HOSPITAL_COMMUNITY): Payer: Self-pay | Admitting: Orthopedic Surgery

## 2016-10-07 DIAGNOSIS — S46011A Strain of muscle(s) and tendon(s) of the rotator cuff of right shoulder, initial encounter: Secondary | ICD-10-CM | POA: Diagnosis not present

## 2016-10-07 DIAGNOSIS — I10 Essential (primary) hypertension: Secondary | ICD-10-CM | POA: Diagnosis not present

## 2016-10-07 DIAGNOSIS — R112 Nausea with vomiting, unspecified: Secondary | ICD-10-CM | POA: Diagnosis not present

## 2016-10-07 DIAGNOSIS — Z79899 Other long term (current) drug therapy: Secondary | ICD-10-CM | POA: Diagnosis not present

## 2016-10-07 DIAGNOSIS — Z87891 Personal history of nicotine dependence: Secondary | ICD-10-CM | POA: Diagnosis not present

## 2016-10-07 MED ORDER — IBUPROFEN 800 MG PO TABS: 800.0000 mg | ORAL_TABLET | Freq: Three times a day (TID) | ORAL | 1 refills | Status: DC | PRN

## 2016-10-07 MED ORDER — HYDROCODONE-ACETAMINOPHEN 7.5-325 MG PO TABS: 1.0000 | ORAL_TABLET | ORAL | 0 refills | Status: DC | PRN

## 2016-10-07 MED ORDER — PROMETHAZINE HCL 12.5 MG PO TABS: 12.5000 mg | ORAL_TABLET | Freq: Four times a day (QID) | ORAL | 1 refills | Status: DC | PRN

## 2016-10-07 NOTE — Anesthesia Postprocedure Evaluation (Signed)
Anesthesia Post Note  Patient: Tammy [name]  Procedure(s) Performed: Procedure(s) (LRB): OPEN ROTATOR CUFF REPAIR RIGHT SHOULDER (Right)  Patient location during evaluation: Nursing Unit Anesthesia Type: General Level of consciousness: awake and alert and oriented Pain management: pain level controlled Vital Signs Assessment: post-procedure vital signs reviewed and stable Respiratory status: spontaneous breathing Cardiovascular status: stable : Patient developed nausea and vomiting postoperatively; treated with zofran and phenergan with good results. Anesthetic complications: no    Last Vitals:  Vitals:   10/07/16 0235 10/07/16 0604  BP:  115/86  Pulse: 83 75  Resp: 18 18  Temp: 36.7 C 36.6 C    Last Pain:  Vitals:   10/07/16 0737  TempSrc:   PainSc: 7                  Tammy [name] A

## 2016-10-07 NOTE — Discharge Instructions (Signed)
Wear sling all the time   Make a fist with your hand and open it 100 x per day

## 2016-10-07 NOTE — Discharge Summary (Signed)
Physician Discharge Summary  Patient ID: PALOMA CREER MRN: [id number] DOB/AGE: May 29, 1946 70 y.o.  Admit date: 10/06/2016 Discharge date: 10/07/2016  Admission Diagnoses:  Discharge Diagnoses:  Active Problems:   Complete tear of left rotator cuff   S/P right rotator cuff repair   Discharged Condition: good  Hospital Course: admitted for control of nausea after open right rotator cuff repair  treated with phenergan and zofran.    Discharge Exam: Blood pressure 115/86, pulse 75, temperature 97.8 F (36.6 C), temperature source Oral, resp. rate 18, height 5\' 4"  (1.626 m), weight 185 lb 4.8 oz (84.1 kg), SpO2 93 %.   Disposition: 01-Home or Self Care  Discharge Instructions    Diet - low sodium heart healthy    Complete by:  As directed    Discharge instructions    Complete by:  As directed    Recommend sling and swathe. Recommend sleeping upright in a recliner for one week. Activities as tolerated.   Increase activity slowly    Complete by:  As directed        Medication List    STOP taking these medications   HYDROcodone-acetaminophen 5-325 MG tablet Commonly known as:  NORCO/VICODIN Replaced by:  HYDROcodone-acetaminophen 7.5-325 MG tablet     TAKE these medications   acetaminophen-codeine 300-30 MG tablet Commonly known as:  TYLENOL #3 Take 1 tablet by mouth every 6 (six) hours as needed for moderate pain.   HYDROcodone-acetaminophen 7.5-325 MG tablet Commonly known as:  NORCO Take 1 tablet by mouth every 4 (four) hours as needed for moderate pain. Replaces:  HYDROcodone-acetaminophen 5-325 MG tablet   ibuprofen 800 MG tablet Commonly known as:  ADVIL,MOTRIN Take 1 tablet (800 mg total) by mouth every 8 (eight) hours as needed.   levocetirizine 5 MG tablet Commonly known as:  XYZAL Take 5 mg by mouth at bedtime as needed for allergies.   losartan 100 MG tablet Commonly known as:  COZAAR Take 100 mg by mouth daily.   promethazine 12.5 MG  tablet Commonly known as:  PHENERGAN Take 1 tablet (12.5 mg total) by mouth every 6 (six) hours as needed for nausea or vomiting.        Signed: Arther Abbott 10/07/2016, 8:24 AM

## 2016-10-07 NOTE — Addendum Note (Signed)
Addendum  created 10/07/16 0806 by Mickel Baas, CRNA   Sign clinical note

## 2016-10-10 ENCOUNTER — Encounter: Payer: Self-pay | Admitting: Orthopedic Surgery

## 2016-10-10 ENCOUNTER — Ambulatory Visit (INDEPENDENT_AMBULATORY_CARE_PROVIDER_SITE_OTHER): Payer: Self-pay | Admitting: Orthopedic Surgery

## 2016-10-10 DIAGNOSIS — Z4889 Encounter for other specified surgical aftercare: Secondary | ICD-10-CM

## 2016-10-10 NOTE — Progress Notes (Signed)
Patient ID: Tammy [name], female   DOB: [birth year], 70 y.o.   MRN: [id number]  Post op visit   Chief Complaint  Patient presents with  . Follow-up    POST OP 1, RT RCR, DOS 10/06/16     Pain well controlled at this time.  Wound looks clean  New dressing applied  Patient placed in sling shot  Return for staples to become out on the 22nd  Start therapy on December 21  Encounter Diagnosis  Name Primary?  Marland Kitchen Aftercare following surgery Yes

## 2016-10-10 NOTE — Patient Instructions (Signed)
Continue to wear sling keep the arm close to your body  Exercise the hand with the exercise ball  Follow-up for staples to come out  He will wear the sling for 6 weeks and we will start therapy 6 weeks after surgery

## 2016-10-19 ENCOUNTER — Encounter: Payer: Self-pay | Admitting: Orthopedic Surgery

## 2016-10-19 ENCOUNTER — Ambulatory Visit (INDEPENDENT_AMBULATORY_CARE_PROVIDER_SITE_OTHER): Payer: Medicare Other | Admitting: Orthopedic Surgery

## 2016-10-19 DIAGNOSIS — Z4889 Encounter for other specified surgical aftercare: Secondary | ICD-10-CM

## 2016-10-19 DIAGNOSIS — Z9889 Other specified postprocedural states: Secondary | ICD-10-CM

## 2016-10-19 NOTE — Progress Notes (Signed)
Patient ID: Tammy [name], female   DOB: 07/22/1946, 70 y.o.   MRN: [id number]  Post op visit   Chief Complaint  Patient presents with  . Follow-up    post op RCT, DOS 10/06/16    Postop right rotator cuff repair  She is in a sling shot for 6 weeks total. Her wound looks good her staples were removed. She can remove the sling for eating with precautions as described  When she returns we will start physical therapy

## 2016-11-16 ENCOUNTER — Encounter: Payer: Self-pay | Admitting: Orthopedic Surgery

## 2016-11-16 ENCOUNTER — Ambulatory Visit (INDEPENDENT_AMBULATORY_CARE_PROVIDER_SITE_OTHER): Payer: Medicare Other | Admitting: Orthopedic Surgery

## 2016-11-16 DIAGNOSIS — Z4889 Encounter for other specified surgical aftercare: Secondary | ICD-10-CM

## 2016-11-16 DIAGNOSIS — Z9889 Other specified postprocedural states: Secondary | ICD-10-CM

## 2016-11-16 NOTE — Progress Notes (Signed)
Patient ID: Tammy [name], female   DOB: 01/01/1946, 70 y.o.   MRN: [id number]  Follow up visit/  postop visit  Chief Complaint  Patient presents with  . Follow-up    RT RCR, DOS 10/06/16    Six-week status post right rotator cuff repair she's been in a sling-and-swathe for that time with a sling shot  Her wound looks good she is ready for physical therapy with occupational therapist at our outpatient department  She can remove the sling for eating as she has been doing she is not abductor externally rotated the arm on her own   Encounter Diagnosis  Name Primary?  . S/P right rotator cuff repair Yes    Follow-up 6 weeks  Start OT per protocol  11:59 AM Arther Abbott, MD 11/16/2016

## 2016-11-22 ENCOUNTER — Ambulatory Visit (HOSPITAL_COMMUNITY): Payer: Medicare Other | Attending: Orthopedic Surgery | Admitting: Occupational Therapy

## 2016-11-22 ENCOUNTER — Encounter (HOSPITAL_COMMUNITY): Payer: Self-pay | Admitting: Occupational Therapy

## 2016-11-22 DIAGNOSIS — M25511 Pain in right shoulder: Secondary | ICD-10-CM | POA: Diagnosis not present

## 2016-11-22 DIAGNOSIS — R29898 Other symptoms and signs involving the musculoskeletal system: Secondary | ICD-10-CM | POA: Diagnosis not present

## 2016-11-22 DIAGNOSIS — M25611 Stiffness of right shoulder, not elsewhere classified: Secondary | ICD-10-CM | POA: Diagnosis not present

## 2016-11-22 NOTE — Patient Instructions (Signed)
SHOULDER: Flexion On Table   Place hands on table, elbows straight. Move hips away from body. Press hands down into table.  _10-15__ reps per set, _1-2__ sets per day  Abduction (Passive)   With arm out to side, resting on table, lower head toward arm, keeping trunk away from table.  Repeat _10-15___ times. Do _1-2___ sessions per day.  Copyright  VHI. All rights reserved.     Internal Rotation (Assistive)   Seated with elbow bent at right angle and held against side, slide arm on table surface in an inward arc. Repeat __10-15__ times. Do _1-2___ sessions per day. Activity: Use this motion to brush crumbs off the table.  Copyright  VHI. All rights reserved.    

## 2016-11-22 NOTE — Therapy (Signed)
[place], [place], [address] Phone: [phone]   Fax:  [phone]  Occupational Therapy Evaluation  Patient Details  Name: Tammy [name] MRN: [id number] Date of Birth: [birth year] Referring Provider: Dr. Arther Abbott  Encounter Date: 11/22/2016      OT End of Session - 11/22/16 1214    Visit Number 1   Number of Visits 24   Date for OT Re-Evaluation 01/21/17  mini-reassessment 12/20/16   Authorization Type Medicare A & B   Authorization Time Period Before 10th visit   Authorization - Visit Number 1   Authorization - Number of Visits 10   OT Start Time [id number]   OT Stop Time 1105   OT Time Calculation (min) 33 min   Activity Tolerance Patient tolerated treatment well   Behavior During Therapy [facility] for tasks assessed/performed      Past Medical History:  Diagnosis Date  . Hypertension     Past Surgical History:  Procedure Laterality Date  . ABDOMINAL HYSTERECTOMY  1988  . COLONOSCOPY N/A 08/11/2016   Procedure: COLONOSCOPY;  Surgeon: Rogene Houston, MD;  Location: [facility] END[facility]ITE;  Service: Endoscopy;  Laterality: N/A;  730  . ELBOW FRACTURE SURGERY    . KNEE ARTHROSCOPY    . SHOULDER OPEN ROTATOR CUFF REPAIR Right 10/06/2016   Procedure: OPEN ROTATOR CUFF REPAIR RIGHT SHOULDER;  Surgeon: Carole Civil, MD;  Location: [facility] ORS;  Service: Orthopedics;  Laterality: Right;    There were no vitals filed for this visit.      Subjective Assessment - 11/22/16 1212    Subjective  S: The doctor said I can sleep without my sling now.    Pertinent History Pt is a 70 y/o female s/p open right rotator cuff repair on 10/06/16. Pt is using pain medications and ice for pain management and is wearing sling at all times except when sleeping. Dr. Dorothyann Peng Harrision referred pt for occupational therapy evaluation and treatment.    Special Tests --   Patient Stated Goals To be able to use my arm again.    Currently  in Pain? No/denies           [facility] OT Assessment - 11/22/16 1030      Assessment   Diagnosis Right open rotator cuff repair   Referring Provider Dr. Arther Abbott   Onset Date 10/06/16   Prior Therapy None     Precautions   Precautions Shoulder   Type of Shoulder Precautions P/ROM 12/26-1/23. AA/ROM and progress as tolerated   Shoulder Interventions Shoulder sling/immobilizer  during the day; off at night      Balance Screen   Has the patient fallen in the past 6 months No   Has the patient had a decrease in activity level because of a fear of falling?  No   Is the patient reluctant to leave their home because of a fear of falling?  No     Home  Environment   Family/patient expects to be discharged to: Private residence   [place] Retired   Leisure church activities; staying with grandkids; yardwork     ADL   ADL comments Pt is unable to complete ADL tasks with RUE due to pain and precautions     Written Expression   Dominant Hand Right     Cognition   Overall Cognitive Status Within  Functional Limits for tasks assessed     ROM / Strength   AROM / PROM / Strength AROM;PROM;Strength     Palpation   Palpation comment Mod fascial restrictions in right upper arm, trapezius, and scapularis regions     AROM   Overall AROM  Unable to assess;Due to precautions   AROM Assessment Site Shoulder   Right/Left Shoulder Right     PROM   Overall PROM Comments Assessed supine, er/IR adducted   PROM Assessment Site Shoulder   Right/Left Shoulder Right   Right Shoulder Flexion 86 Degrees   Right Shoulder ABduction 51 Degrees   Right Shoulder Internal Rotation 90 Degrees   Right Shoulder External Rotation 10 Degrees     Strength   Overall Strength Unable to assess;Due to precautions   Strength Assessment Site Shoulder   Right/Left Shoulder Right                          OT Education - 11/22/16 1054    Education provided Yes   Education Details table slides   Person(s) Educated Patient   Methods Explanation;Demonstration;Handout   Comprehension Verbalized understanding;Returned demonstration          OT Short Term Goals - 11/22/16 1304      OT SHORT TERM GOAL #1   Title Pt will be provided and and educated on HEP.    Time 4   Period Weeks   Status New     OT SHORT TERM GOAL #2   Title Pt will decrease RUE pain to 4/10 to improve ability to complete ADL tasks using compensatory strategies.    Time 4   Period Days   Status New     OT SHORT TERM GOAL #3   Title Pt will decrease RUE fascial restrictions from moderate to minimal amounts to improve mobility required to complete HEP.    Time 4   Period Weeks   Status New     OT SHORT TERM GOAL #4   Title Pt will improve RUE P/ROM to [facility] to improve ability to complete dressing tasks.    Time 4   Period Weeks   Status New           OT Long Term Goals - 11/22/16 1353      OT LONG TERM GOAL #1   Title Pt will return to highest level of functioning with ADL tasks using RUE as dominant.    Time 8   Period Weeks   Status New     OT LONG TERM GOAL #2   Title Pt will decrease RUE pain to 2/10 or less to improve ability to complete ADL tasks using RUE as dominant.    Time 8   Period Weeks   Status New     OT LONG TERM GOAL #3   Title Pt will decrease RUE fascial restrictions from min to trace amounts to improve mobility required for overhead reaching tasks.    Time 8   Period Weeks   Status New     OT LONG TERM GOAL #4   Title Pt will improve RUE A/ROM to [facility] to improve ability to reach into overhead cabinets/shelves for items.    Time 8   Period Weeks     OT LONG TERM GOAL #5   Title Pt will improve RUE strength to 4+/5 to improve ability to complete yardwork using RUE as dominant.    Time 8   Period  Weeks   Status New               Plan - November 27, 2016 1301     Clinical Impression Statement A: Pt is a 70 y/o female s/p right open rotator cuff repair on 10/06/16 presenting with increased pain and decreased functional use of RUE as dominant during functiona tasks. Pt was provided with table slides this session and demonstrates understanding.    Rehab Potential Good   OT Frequency 2x / week   OT Duration 8 weeks   OT Treatment/Interventions Therapeutic exercise;Patient/family education;Manual Therapy;Therapeutic activities;Cryotherapy;Electrical Stimulation;Moist Heat;Passive range of motion   Plan P: Pt will benefit from skilled OT services to decrease pain and fascial restrictions, increase range of motion, strength, and functional use of RUE as dominant. Treatment plan: myofascial release, manual therapy, P/ROM, AA/ROM, A/ROM, general RUE strengthening, scapular stability and strengthening, modalities prn   OT Home Exercise Plan 2023-11-28: table slides   Consulted and Agree with Plan of Care Patient      Patient will benefit from skilled therapeutic intervention in order to improve the following deficits and impairments:  Impaired flexibility, Decreased strength, Decreased activity tolerance, Pain, Decreased range of motion, Increased fascial restricitons, Impaired UE functional use  Visit Diagnosis: Acute pain of right shoulder  Stiffness of right shoulder, not elsewhere classified  Other symptoms and signs involving the musculoskeletal system      G-Codes - 11/27/2016 1356    Functional Assessment Tool Used clinical judgement   Functional Limitation Carrying, moving and handling objects   Carrying, Moving and Handling Objects Current Status [id number]) At least 80 percent but less than 100 percent impaired, limited or restricted   Carrying, Moving and Handling Objects Goal Status [id number]) At least 40 percent but less than 60 percent impaired, limited or restricted      Problem List Patient Active Problem List   Diagnosis Date Noted  . S/P right  rotator cuff repair 10/06/2016  . Complete tear of left rotator cuff    Guadelupe Sabin, OTR/L  [phone] November 27, 2016, 1:58 PM  [place] [address] [place], [place], [address] Phone: [phone]   Fax:  [phone]  Name: Tammy [name] MRN: [id number] Date of Birth: September 30, 1946

## 2016-11-24 ENCOUNTER — Ambulatory Visit (HOSPITAL_COMMUNITY): Payer: Medicare Other | Admitting: Specialist

## 2016-11-24 DIAGNOSIS — M25511 Pain in right shoulder: Secondary | ICD-10-CM

## 2016-11-24 DIAGNOSIS — M25611 Stiffness of right shoulder, not elsewhere classified: Secondary | ICD-10-CM

## 2016-11-24 DIAGNOSIS — R29898 Other symptoms and signs involving the musculoskeletal system: Secondary | ICD-10-CM | POA: Diagnosis not present

## 2016-11-24 NOTE — Therapy (Signed)
[place], [place], [address] Phone: [phone]   Fax:  [phone]  Occupational Therapy Treatment  Patient Details  Name: Tammy [name] MRN: [id number] Date of Birth: 28-Jun-1946 Referring Provider: Dr. Arther Abbott  Encounter Date: 11/24/2016      OT End of Session - 11/24/16 1643    Visit Number 2   Number of Visits 24   Date for OT Re-Evaluation 01/21/17  mini reassess on 12/20/16   Authorization Type Medicare A & B   Authorization Time Period Before 10th visit   Authorization - Visit Number 2   Authorization - Number of Visits 10   OT Start Time 1515   OT Stop Time 1600   OT Time Calculation (min) 45 min   Activity Tolerance Patient tolerated treatment well   Behavior During Therapy [facility] for tasks assessed/performed      Past Medical History:  Diagnosis Date  . Hypertension     Past Surgical History:  Procedure Laterality Date  . ABDOMINAL HYSTERECTOMY  1988  . COLONOSCOPY N/A 08/11/2016   Procedure: COLONOSCOPY;  Surgeon: Rogene Houston, MD;  Location: [facility];  Service: Endoscopy;  Laterality: N/A;  730  . ELBOW FRACTURE SURGERY    . KNEE ARTHROSCOPY    . SHOULDER OPEN ROTATOR CUFF REPAIR Right 10/06/2016   Procedure: OPEN ROTATOR CUFF REPAIR RIGHT SHOULDER;  Surgeon: Carole Civil, MD;  Location: [facility];  Service: Orthopedics;  Laterality: Right;    There were no vitals filed for this visit.      Subjective Assessment - 11/24/16 1642    Subjective  S:  I think I am doing pretty well.  It hurts so much less than before surgery.   Currently in Pain? Yes   Pain Score 2    Pain Location Shoulder   Pain Orientation Right   Pain Descriptors / Indicators Aching   Pain Type Acute pain   Pain Onset More than a month ago   Pain Frequency Intermittent   Aggravating Factors  use   Pain Relieving Factors rest   Effect of Pain on Daily Activities decreased use of dominant right  arm            [facility] OT Assessment - 11/24/16 0001      Assessment   Diagnosis Right open rotator cuff repair     Precautions   Precautions Shoulder   Type of Shoulder Precautions P/ROM 12/26-1/23. AA/ROM and progress as tolerated   Shoulder Interventions Shoulder sling/immobilizer                  OT Treatments/Exercises (OP) - 11/24/16 0001      Exercises   Exercises Shoulder     Shoulder Exercises: Supine   Protraction PROM;5 reps   Horizontal ABduction PROM;5 reps   External Rotation PROM;5 reps   Internal Rotation PROM;5 reps   Flexion PROM;5 reps   ABduction PROM;5 reps     Shoulder Exercises: Seated   Elevation AROM;10 reps   Extension AROM;10 reps   Row AROM;10 reps     Shoulder Exercises: Therapy Ball   Flexion 10 reps   ABduction 10 reps     Shoulder Exercises: ROM/Strengthening   Anterior Glide 5 X 5"   Caudal Glide 5 X 5"     Shoulder Exercises: Isometric Strengthening   Flexion Supine;5X5"   Extension Supine;5X5"   External Rotation Supine;5X5"   Internal Rotation Supine;5X5"   ABduction  Supine;5X5"   ADduction Supine;5X5"     Manual Therapy   Manual Therapy Myofascial release   Manual therapy comments Manual therapy completed prior to exercises.   Myofascial Release myofascial release and manual stretching to right upper arm, scapular region and associated regions to decrease pain and restrictions and improve pain free mobility in right shoulder region                OT Education - 11/24/16 1642    Education provided Yes   Education Details reviewed plan of care and issued a copy to patient, reviewed ball stretches as patient has ball at home   Person(s) Educated Patient   Methods Explanation;Handout   Comprehension Verbalized understanding          OT Short Term Goals - 11/24/16 1644      OT SHORT TERM GOAL #1   Title Pt will be provided and and educated on HEP.    Time 4   Period Weeks   Status On-going      OT SHORT TERM GOAL #2   Title Pt will decrease RUE pain to 4/10 to improve ability to complete ADL tasks using compensatory strategies.    Time 4   Period Days   Status On-going     OT SHORT TERM GOAL #3   Title Pt will decrease RUE fascial restrictions from moderate to minimal amounts to improve mobility required to complete HEP.    Time 4   Period Weeks   Status On-going     OT SHORT TERM GOAL #4   Title Pt will improve RUE P/ROM to [facility] to improve ability to complete dressing tasks.    Time 4   Period Weeks   Status On-going     OT SHORT TERM GOAL #5   Status On-going           OT Long Term Goals - 11/24/16 1645      OT LONG TERM GOAL #1   Title Pt will return to highest level of functioning with ADL tasks using RUE as dominant.    Time 8   Period Weeks   Status On-going     OT LONG TERM GOAL #2   Title Pt will decrease RUE pain to 2/10 or less to improve ability to complete ADL tasks using RUE as dominant.    Time 8   Period Weeks   Status On-going     OT LONG TERM GOAL #3   Title Pt will decrease RUE fascial restrictions from min to trace amounts to improve mobility required for overhead reaching tasks.    Time 8   Period Weeks   Status On-going     OT LONG TERM GOAL #4   Title Pt will improve RUE A/ROM to [facility] to improve ability to reach into overhead cabinets/shelves for items.    Time 8   Period Weeks   Status On-going     OT LONG TERM GOAL #5   Title Pt will improve RUE strength to 4+/5 to improve ability to complete yardwork using RUE as dominant.    Time 8   Period Weeks   Status On-going               Plan - 11/24/16 1643    Clinical Impression Statement A:  Patient with approximately 50% P/ROM in supine prior to increased pain.  Patient is competing HEP and will begin with ball vs table slides at home.    Plan P:  issue isometric strengthening for home, continue to improve P/ROM per protocol.    Consulted and Agree with Plan of Care  Patient      Patient will benefit from skilled therapeutic intervention in order to improve the following deficits and impairments:  Impaired flexibility, Decreased strength, Decreased activity tolerance, Pain, Decreased range of motion, Increased fascial restricitons, Impaired UE functional use  Visit Diagnosis: Acute pain of right shoulder  Stiffness of right shoulder, not elsewhere classified  Other symptoms and signs involving the musculoskeletal system    Problem List Patient Active Problem List   Diagnosis Date Noted  . S/P right rotator cuff repair 10/06/2016  . Complete tear of left rotator cuff     Vangie Bicker, [place], OTR/L [phone]  11/24/2016, 4:48 PM  [place] [address] [place], [place], [address] Phone: [phone]   Fax:  [phone]  Name: Tammy [name] MRN: [id number] Date of Birth: [birth year]

## 2016-11-29 ENCOUNTER — Encounter (HOSPITAL_COMMUNITY): Payer: Self-pay | Admitting: Occupational Therapy

## 2016-11-29 ENCOUNTER — Ambulatory Visit (HOSPITAL_COMMUNITY): Payer: Medicare Other | Attending: Orthopedic Surgery | Admitting: Occupational Therapy

## 2016-11-29 DIAGNOSIS — M79601 Pain in right arm: Secondary | ICD-10-CM | POA: Diagnosis not present

## 2016-11-29 DIAGNOSIS — M25511 Pain in right shoulder: Secondary | ICD-10-CM

## 2016-11-29 DIAGNOSIS — M25611 Stiffness of right shoulder, not elsewhere classified: Secondary | ICD-10-CM

## 2016-11-29 DIAGNOSIS — R29898 Other symptoms and signs involving the musculoskeletal system: Secondary | ICD-10-CM

## 2016-11-29 NOTE — Therapy (Signed)
[place], [place], [address] Phone: [phone]   Fax:  [phone]  Occupational Therapy Treatment  Patient Details  Name: Tammy [name] MRN: [id number] Date of Birth: 1946/08/10 Referring Provider: Dr. Arther Abbott  Encounter Date: 11/29/2016      OT End of Session - 11/29/16 1349    Visit Number 3   Number of Visits 24   Date for OT Re-Evaluation 01/21/17  mini reassess on 12/20/16   Authorization Type Medicare A & B   Authorization Time Period Before 10th visit   Authorization - Visit Number 3   Authorization - Number of Visits 10   OT Start Time 1300   OT Stop Time 1348   OT Time Calculation (min) 48 min   Activity Tolerance Patient tolerated treatment well   Behavior During Therapy [facility] for tasks assessed/performed      Past Medical History:  Diagnosis Date  . Hypertension     Past Surgical History:  Procedure Laterality Date  . ABDOMINAL HYSTERECTOMY  1988  . COLONOSCOPY N/A 08/11/2016   Procedure: COLONOSCOPY;  Surgeon: Rogene Houston, MD;  [facility];  Service: Endoscopy;  Laterality: N/A;  730  . ELBOW FRACTURE SURGERY    . KNEE ARTHROSCOPY    . SHOULDER OPEN ROTATOR CUFF REPAIR Right 10/06/2016   Procedure: OPEN ROTATOR CUFF REPAIR RIGHT SHOULDER;  Surgeon: Carole Civil, MD;  Location: [facility];  Service: Orthopedics;  Laterality: Right;    There were no vitals filed for this visit.      Subjective Assessment - 11/29/16 1255    Subjective  S: It was very stiff yesterday afternoon. I put some heat on it and it felt better.    Currently in Pain? No/denies            [facility] OT Assessment - 11/29/16 1254      Assessment   Diagnosis Right open rotator cuff repair     Precautions   Precautions Shoulder   Type of Shoulder Precautions P/ROM 12/26-1/23. AA/ROM and progress as tolerated   Shoulder Interventions Shoulder sling/immobilizer                   OT Treatments/Exercises (OP) - 11/29/16 1303      Exercises   Exercises Shoulder     Shoulder Exercises: Supine   Protraction PROM;5 reps   Horizontal ABduction PROM;5 reps   External Rotation PROM;5 reps   Internal Rotation PROM;5 reps   Flexion PROM;5 reps   ABduction PROM;5 reps     Shoulder Exercises: Seated   Elevation AROM;10 reps   Extension AROM;10 reps   Row AROM;10 reps     Shoulder Exercises: Therapy Ball   Flexion 10 reps   ABduction 10 reps     Shoulder Exercises: ROM/Strengthening   Anterior Glide 3X10"   Caudal Glide 3X10"     Shoulder Exercises: Isometric Strengthening   Flexion Supine;5X5"   Extension Supine;5X5"   External Rotation Supine;5X5"   Internal Rotation Supine;5X5"   ABduction Supine;5X5"   ADduction Supine;5X5"     Manual Therapy   Manual Therapy Myofascial release   Manual therapy comments Manual therapy completed prior to exercises.   Myofascial Release myofascial release and manual stretching to right upper arm, scapular region and associated regions to decrease pain and restrictions and improve pain free mobility in right shoulder region  OT Short Term Goals - 11/24/16 1644      OT SHORT TERM GOAL #1   Title Pt will be provided and and educated on HEP.    Time 4   Period Weeks   Status On-going     OT SHORT TERM GOAL #2   Title Pt will decrease RUE pain to 4/10 to improve ability to complete ADL tasks using compensatory strategies.    Time 4   Period Days   Status On-going     OT SHORT TERM GOAL #3   Title Pt will decrease RUE fascial restrictions from moderate to minimal amounts to improve mobility required to complete HEP.    Time 4   Period Weeks   Status On-going     OT SHORT TERM GOAL #4   Title Pt will improve RUE P/ROM to [facility] to improve ability to complete dressing tasks.    Time 4   Period Weeks   Status On-going     OT SHORT TERM GOAL #5   Status On-going           OT  Long Term Goals - 11/24/16 1645      OT LONG TERM GOAL #1   Title Pt will return to highest level of functioning with ADL tasks using RUE as dominant.    Time 8   Period Weeks   Status On-going     OT LONG TERM GOAL #2   Title Pt will decrease RUE pain to 2/10 or less to improve ability to complete ADL tasks using RUE as dominant.    Time 8   Period Weeks   Status On-going     OT LONG TERM GOAL #3   Title Pt will decrease RUE fascial restrictions from min to trace amounts to improve mobility required for overhead reaching tasks.    Time 8   Period Weeks   Status On-going     OT LONG TERM GOAL #4   Title Pt will improve RUE A/ROM to [facility] to improve ability to reach into overhead cabinets/shelves for items.    Time 8   Period Weeks   Status On-going     OT LONG TERM GOAL #5   Title Pt will improve RUE strength to 4+/5 to improve ability to complete yardwork using RUE as dominant.    Time 8   Period Weeks   Status On-going               Plan - 11/29/16 1349    Clinical Impression Statement A: Pt with increased pain at end range of P/ROM, slightly greater than 50%. Continued with isometrics, P/ROM, and scapular A/ROM, pt requires intermittent verbal cuing for form and technique with scapular A/ROM. Provided and reviewed shoulder isometric strengthening HEP.    Plan P: Follow up on isometric strengthening HEP, Continue working to improve P/ROM within pain tolerance   OT Home Exercise Plan 12/26: table slides; 1/2: isometric strengthening   Consulted and Agree with Plan of Care Patient      Patient will benefit from skilled therapeutic intervention in order to improve the following deficits and impairments:  Impaired flexibility, Decreased strength, Decreased activity tolerance, Pain, Decreased range of motion, Increased fascial restricitons, Impaired UE functional use  Visit Diagnosis: Acute pain of right shoulder  Stiffness of right shoulder, not elsewhere  classified  Other symptoms and signs involving the musculoskeletal system    Problem List Patient Active Problem List   Diagnosis Date Noted  . S/P  right rotator cuff repair 10/06/2016  . Complete tear of left rotator cuff    Guadelupe Sabin, OTR/L  [phone] 11/29/2016, 1:52 PM  [place], [place], [address] Phone: [phone]   Fax:  [phone]  Name: Tammy [name] MRN: [id number] Date of Birth: Mar 23, 1946

## 2016-12-01 ENCOUNTER — Ambulatory Visit (HOSPITAL_COMMUNITY): Payer: Medicare Other | Admitting: Occupational Therapy

## 2016-12-01 ENCOUNTER — Encounter (HOSPITAL_COMMUNITY): Payer: Self-pay | Admitting: Occupational Therapy

## 2016-12-01 DIAGNOSIS — R29898 Other symptoms and signs involving the musculoskeletal system: Secondary | ICD-10-CM | POA: Diagnosis not present

## 2016-12-01 DIAGNOSIS — M25611 Stiffness of right shoulder, not elsewhere classified: Secondary | ICD-10-CM

## 2016-12-01 DIAGNOSIS — M79601 Pain in right arm: Secondary | ICD-10-CM | POA: Diagnosis not present

## 2016-12-01 DIAGNOSIS — M25511 Pain in right shoulder: Secondary | ICD-10-CM | POA: Diagnosis not present

## 2016-12-01 NOTE — Therapy (Signed)
[place], [place], [address] Phone: [phone]   Fax:  [phone]  Occupational Therapy Treatment  Patient Details  Name: Tammy [name] MRN: [id number] Date of Birth: 08-24-1946 Referring Provider: Dr. Arther Abbott  Encounter Date: 12/01/2016      OT End of Session - 12/01/16 1338    Visit Number 4   Number of Visits 24   Date for OT Re-Evaluation 01/21/17  mini reassess on 12/20/16   Authorization Type Medicare A & B   Authorization Time Period Before 10th visit   Authorization - Visit Number 4   Authorization - Number of Visits 10   OT Start Time 1255   OT Stop Time 1337   OT Time Calculation (min) 42 min   Activity Tolerance Patient tolerated treatment well   Behavior During Therapy [facility] for tasks assessed/performed      Past Medical History:  Diagnosis Date  . Hypertension     Past Surgical History:  Procedure Laterality Date  . ABDOMINAL HYSTERECTOMY  1988  . COLONOSCOPY N/A 08/11/2016   Procedure: COLONOSCOPY;  Surgeon: Rogene Houston, MD;  [facility]: [facility];  Service: Endoscopy;  Laterality: N/A;  730  . ELBOW FRACTURE SURGERY    . KNEE ARTHROSCOPY    . SHOULDER OPEN ROTATOR CUFF REPAIR Right 10/06/2016   Procedure: OPEN ROTATOR CUFF REPAIR RIGHT SHOULDER;  Surgeon: Carole Civil, MD;  Location: [facility];  Service: Orthopedics;  Laterality: Right;    There were no vitals filed for this visit.      Subjective Assessment - 12/01/16 1253    Subjective  S: There is just one spot on my upper arm that is sore sometimes.    Currently in Pain? No/denies            [facility] OT Assessment - 12/01/16 1253      Assessment   Diagnosis Right open rotator cuff repair     Precautions   Precautions Shoulder   Type of Shoulder Precautions P/ROM 12/26-1/23. AA/ROM and progress as tolerated   Shoulder Interventions Shoulder sling/immobilizer                  OT  Treatments/Exercises (OP) - 12/01/16 1257      Exercises   Exercises Shoulder     Shoulder Exercises: Supine   Protraction PROM;5 reps   Horizontal ABduction PROM;5 reps   External Rotation PROM;5 reps   Internal Rotation PROM;5 reps   Flexion PROM;5 reps   ABduction PROM;5 reps     Shoulder Exercises: Seated   Elevation AROM;12 reps   Extension AROM;12 reps   Row AROM;12 reps     Shoulder Exercises: Therapy Ball   Flexion 15 reps   ABduction 15 reps     Shoulder Exercises: ROM/Strengthening   Thumb Tacks low thumb tacks 1'   Anterior Glide 3X10"   Caudal Glide 3X10"     Shoulder Exercises: Isometric Strengthening   Flexion 5X5"  standing   Extension 5X5"  standing   External Rotation 5X5"  standing   Internal Rotation 5X5"  standing   ABduction 5X5"  standing   ADduction 5X5"  standing     Manual Therapy   Manual Therapy Myofascial release   Manual therapy comments Manual therapy completed prior to exercises.   Myofascial Release myofascial release and manual stretching to right upper arm, scapular region and associated regions to decrease pain and restrictions and improve pain free mobility  in right shoulder region                  OT Short Term Goals - 11/24/16 1644      OT SHORT TERM GOAL #1   Title Pt will be provided and and educated on HEP.    Time 4   Period Weeks   Status On-going     OT SHORT TERM GOAL #2   Title Pt will decrease RUE pain to 4/10 to improve ability to complete ADL tasks using compensatory strategies.    Time 4   Period Days   Status On-going     OT SHORT TERM GOAL #3   Title Pt will decrease RUE fascial restrictions from moderate to minimal amounts to improve mobility required to complete HEP.    Time 4   Period Weeks   Status On-going     OT SHORT TERM GOAL #4   Title Pt will improve RUE P/ROM to [facility] to improve ability to complete dressing tasks.    Time 4   Period Weeks   Status On-going     OT SHORT TERM  GOAL #5   Status On-going           OT Long Term Goals - 11/24/16 1645      OT LONG TERM GOAL #1   Title Pt will return to highest level of functioning with ADL tasks using RUE as dominant.    Time 8   Period Weeks   Status On-going     OT LONG TERM GOAL #2   Title Pt will decrease RUE pain to 2/10 or less to improve ability to complete ADL tasks using RUE as dominant.    Time 8   Period Weeks   Status On-going     OT LONG TERM GOAL #3   Title Pt will decrease RUE fascial restrictions from min to trace amounts to improve mobility required for overhead reaching tasks.    Time 8   Period Weeks   Status On-going     OT LONG TERM GOAL #4   Title Pt will improve RUE A/ROM to [facility] to improve ability to reach into overhead cabinets/shelves for items.    Time 8   Period Weeks   Status On-going     OT LONG TERM GOAL #5   Title Pt will improve RUE strength to 4+/5 to improve ability to complete yardwork using RUE as dominant.    Time 8   Period Weeks   Status On-going               Plan - 12/01/16 1339    Clinical Impression Statement A: Added low level thumb tacks, continued with P/ROM, completed isometrics standing. Pt requires verbal and tactile facilitation to initiate scapular A/ROM with proper form and technqiue. Initial verbal guidance required for standing isometrics. Pt reports HEP continues to go well.    Plan P: Continue working to improve P/ROM to [facility] within pain tolerance. Continue to follow protocol   OT Home Exercise Plan 12/26: table slides; 1/2: isometric strengthening   Consulted and Agree with Plan of Care Patient      Patient will benefit from skilled therapeutic intervention in order to improve the following deficits and impairments:  Impaired flexibility, Decreased strength, Decreased activity tolerance, Pain, Decreased range of motion, Increased fascial restricitons, Impaired UE functional use  Visit Diagnosis: Acute pain of right  shoulder  Stiffness of right shoulder, not elsewhere classified  Other symptoms and  signs involving the musculoskeletal system    Problem List Patient Active Problem List   Diagnosis Date Noted  . S/P right rotator cuff repair 10/06/2016  . Complete tear of left rotator cuff    Guadelupe Sabin, OTR/L  [phone] 12/01/2016, 1:41 PM  [place] [address] [place], [place], [address] Phone: [phone]   Fax:  [phone]  Name: Tammy [name] MRN: [id number] Date of Birth: 1946/05/16

## 2016-12-05 ENCOUNTER — Ambulatory Visit (HOSPITAL_COMMUNITY): Payer: Medicare Other

## 2016-12-05 DIAGNOSIS — R29898 Other symptoms and signs involving the musculoskeletal system: Secondary | ICD-10-CM | POA: Diagnosis not present

## 2016-12-05 DIAGNOSIS — M25611 Stiffness of right shoulder, not elsewhere classified: Secondary | ICD-10-CM | POA: Diagnosis not present

## 2016-12-05 DIAGNOSIS — M79601 Pain in right arm: Secondary | ICD-10-CM | POA: Diagnosis not present

## 2016-12-05 DIAGNOSIS — M25511 Pain in right shoulder: Secondary | ICD-10-CM

## 2016-12-05 NOTE — Therapy (Signed)
[facility] [address] [place], [place], [address] Phone: [phone]   Fax:  [phone]  Occupational Therapy Treatment  Patient Details  Name: Tammy [name] MRN: [id number] Date of Birth: 09/19/1946 Referring Provider: Dr. Stanley Harrison  Encounter Date: 12/05/2016      OT End of Session - 12/05/16 1245    Visit Number 5   Number of Visits 24   Date for OT Re-Evaluation 01/21/17  mini reassess on 12/20/16   Authorization Type Medicare A & B   Authorization Time Period Before 10th visit   Authorization - Visit Number 5   Authorization - Number of Visits 10   OT Start Time 1120   OT Stop Time 1200   OT Time Calculation (min) 40 min   Activity Tolerance Patient tolerated treatment well   Behavior During Therapy WFL for tasks assessed/performed      Past Medical History:  Diagnosis Date  . Hypertension     Past Surgical History:  Procedure Laterality Date  . ABDOMINAL HYSTERECTOMY  1988  . COLONOSCOPY N/A 08/11/2016   Procedure: COLONOSCOPY;  Surgeon: Najeeb U Rehman, MD;  [facility];  Service: Endoscopy;  Laterality: N/A;  730  . ELBOW FRACTURE SURGERY    . KNEE ARTHROSCOPY    . SHOULDER OPEN ROTATOR CUFF REPAIR Right 10/06/2016   Procedure: OPEN ROTATOR CUFF REPAIR RIGHT SHOULDER;  Surgeon: Stanley E Harrison, MD;  Location: [facility];  Service: Orthopedics;  Laterality: Right;    There were no vitals filed for this visit.      Subjective Assessment - 12/05/16 1245    Subjective  S: My arm is stiff after it's been in the sling for a long time.    Currently in Pain? No/denies                      OT Treatments/Exercises (OP) - 12/05/16 1139      Exercises   Exercises Shoulder     Shoulder Exercises: Supine   Protraction PROM;5 reps   Horizontal ABduction PROM;5 reps   External Rotation PROM;5 reps   Internal Rotation PROM;5 reps   Flexion PROM;5 reps   ABduction PROM;5 reps     Shoulder Exercises: Seated   Elevation AROM;12 reps   Extension AROM;12 reps   Row AROM;12 reps     Shoulder Exercises: Therapy Ball   Flexion 15 reps   ABduction 15 reps     Shoulder Exercises: ROM/Strengthening   Anterior Glide 3X10"   Caudal Glide 3X10"     Shoulder Exercises: Isometric Strengthening   Flexion 5X5"   Flexion Limitations standing   Extension 5X5"   Extension Limitations standing   External Rotation 5X5"   External Rotation Limitations standing   Internal Rotation 5X5"   Internal Rotation Limitations standing   ABduction 5X5"   ABduction Limitations standing   ADduction 5X5"   ADduction Limitations standing     Manual Therapy   Manual Therapy Myofascial release   Manual therapy comments Manual therapy completed prior to exercises.   Myofascial Release myofascial release and manual stretching to right upper arm, scapular region and associated regions to decrease pain and restrictions and improve pain free mobility in right shoulder region                  OT Short Term Goals - 11/24/16 1644      OT SHORT TERM GOAL #1   Title Pt will   be provided and and educated on HEP.    Time 4   Period Weeks   Status On-going     OT SHORT TERM GOAL #2   Title Pt will decrease RUE pain to 4/10 to improve ability to complete ADL tasks using compensatory strategies.    Time 4   Period Days   Status On-going     OT SHORT TERM GOAL #3   Title Pt will decrease RUE fascial restrictions from moderate to minimal amounts to improve mobility required to complete HEP.    Time 4   Period Weeks   Status On-going     OT SHORT TERM GOAL #4   Title Pt will improve RUE P/ROM to [facility] to improve ability to complete dressing tasks.    Time 4   Period Weeks   Status On-going     OT SHORT TERM GOAL #5   Status On-going           OT Long Term Goals - 11/24/16 1645      OT LONG TERM GOAL #1   Title Pt will return to highest level of functioning with ADL tasks  using RUE as dominant.    Time 8   Period Weeks   Status On-going     OT LONG TERM GOAL #2   Title Pt will decrease RUE pain to 2/10 or less to improve ability to complete ADL tasks using RUE as dominant.    Time 8   Period Weeks   Status On-going     OT LONG TERM GOAL #3   Title Pt will decrease RUE fascial restrictions from min to trace amounts to improve mobility required for overhead reaching tasks.    Time 8   Period Weeks   Status On-going     OT LONG TERM GOAL #4   Title Pt will improve RUE A/ROM to [facility] to improve ability to reach into overhead cabinets/shelves for items.    Time 8   Period Weeks   Status On-going     OT LONG TERM GOAL #5   Title Pt will improve RUE strength to 4+/5 to improve ability to complete yardwork using RUE as dominant.    Time 8   Period Weeks   Status On-going               Plan - 12/05/16 1245    Clinical Impression Statement A: Pt was able to tolerate passive ROM to a functional range this session. Requires VC for form and technique as needed.    Plan P: Continue to follow protocol. Resume thumb tacks and add pro/ret/elev/dep.      Patient will benefit from skilled therapeutic intervention in order to improve the following deficits and impairments:  Impaired flexibility, Decreased strength, Decreased activity tolerance, Pain, Decreased range of motion, Increased fascial restricitons, Impaired UE functional use  Visit Diagnosis: Acute pain of right shoulder  Stiffness of right shoulder, not elsewhere classified  Other symptoms and signs involving the musculoskeletal system    Problem List Patient Active Problem List   Diagnosis Date Noted  . S/P right rotator cuff repair 10/06/2016  . Complete tear of left rotator cuff    Tammy [name], OTR/L,CBIS  [phone]  12/05/2016, 12:47 PM  [place] [address] [place], [place], [address] Phone: [phone]   Fax:   [phone]  Name: Tammy [name] MRN: [id number] Date of Birth: 05/07/1946

## 2016-12-07 ENCOUNTER — Ambulatory Visit (HOSPITAL_COMMUNITY): Payer: Medicare Other | Admitting: Specialist

## 2016-12-07 DIAGNOSIS — M25611 Stiffness of right shoulder, not elsewhere classified: Secondary | ICD-10-CM

## 2016-12-07 DIAGNOSIS — M79601 Pain in right arm: Secondary | ICD-10-CM

## 2016-12-07 DIAGNOSIS — M25511 Pain in right shoulder: Secondary | ICD-10-CM | POA: Diagnosis not present

## 2016-12-07 DIAGNOSIS — R29898 Other symptoms and signs involving the musculoskeletal system: Secondary | ICD-10-CM

## 2016-12-07 NOTE — Therapy (Signed)
[place], [place], [address] Phone: [phone]   Fax:  [phone]  Occupational Therapy Treatment  Patient Details  Name: Tammy [name] MRN: [id number] Date of Birth: 1946-10-27 Referring Provider: Dr. Arther Abbott  Encounter Date: 12/07/2016      OT End of Session - 12/07/16 1343    Visit Number 6   Number of Visits 24   Date for OT Re-Evaluation 01/21/17  mini reasess on 12/20/16   Authorization Type Medicare A & B   Authorization Time Period Before 10th visit   Authorization - Visit Number 6   Authorization - Number of Visits 10   OT Start Time 1031   OT Stop Time 1115   OT Time Calculation (min) 44 min   Activity Tolerance Patient tolerated treatment well   Behavior During Therapy [facility] for tasks assessed/performed      Past Medical History:  Diagnosis Date  . Hypertension     Past Surgical History:  Procedure Laterality Date  . ABDOMINAL HYSTERECTOMY  1988  . COLONOSCOPY N/A 08/11/2016   Procedure: COLONOSCOPY;  Surgeon: Rogene Houston, MD;  [facility];  Service: Endoscopy;  Laterality: N/A;  730  . ELBOW FRACTURE SURGERY    . KNEE ARTHROSCOPY    . SHOULDER OPEN ROTATOR CUFF REPAIR Right 10/06/2016   Procedure: OPEN ROTATOR CUFF REPAIR RIGHT SHOULDER;  Surgeon: Carole Civil, MD;  Location: [facility];  Service: Orthopedics;  Laterality: Right;    There were no vitals filed for this visit.      Subjective Assessment - 12/07/16 1341    Subjective  S:  I feel like my arm is stiffer today than normal.     Currently in Pain? Yes   Pain Score 2    Pain Location Shoulder   Pain Orientation Right   Pain Descriptors / Indicators Aching   Pain Type Acute pain   Pain Onset More than a month ago   Pain Frequency Intermittent   Aggravating Factors  use, extended time wearing sling   Pain Relieving Factors rest, heat   Effect of Pain on Daily Activities decreased use of right  arm with functional tasks                      OT Treatments/Exercises (OP) - 12/07/16 0001      Exercises   Exercises Shoulder     Shoulder Exercises: Supine   Protraction PROM;5 reps   Horizontal ABduction PROM;5 reps   External Rotation PROM;5 reps   Internal Rotation PROM;5 reps   Flexion PROM;5 reps   ABduction PROM;5 reps     Shoulder Exercises: Seated   Elevation AROM;15 reps   Extension AROM;15 reps   Row AROM;15 reps     Shoulder Exercises: Therapy Ball   Flexion 20 reps   ABduction 20 reps     Shoulder Exercises: ROM/Strengthening   Thumb Tacks low thumb tacks 1'   Prot/Ret//Elev/Dep 1 min with max verbal guidance and mod tactile cues      Shoulder Exercises: Isometric Strengthening   Flexion Supine  3 X 10"   Extension Supine  3 X 10"   External Rotation Supine  3 X 10"   Internal Rotation Supine  3 X 10"   ABduction Supine  3 X 10"   ADduction Supine  3 X 10"     Manual Therapy   Manual Therapy Myofascial release   Manual  therapy comments Manual therapy completed prior to exercises.   Myofascial Release myofascial release and manual stretching to right upper arm, scapular region and associated regions to decrease pain and restrictions and improve pain free mobility in right shoulder region                OT Education - 12/07/16 1342    Education provided Yes   Education Details reviewed sling use, recommended it is ok to remove at home if arm is supported versus hanging.     Person(s) Educated Patient   Methods Explanation;Demonstration   Comprehension Verbalized understanding;Returned demonstration          OT Short Term Goals - 11/24/16 1644      OT SHORT TERM GOAL #1   Title Pt will be provided and and educated on HEP.    Time 4   Period Weeks   Status On-going     OT SHORT TERM GOAL #2   Title Pt will decrease RUE pain to 4/10 to improve ability to complete ADL tasks using compensatory strategies.    Time 4    Period Days   Status On-going     OT SHORT TERM GOAL #3   Title Pt will decrease RUE fascial restrictions from moderate to minimal amounts to improve mobility required to complete HEP.    Time 4   Period Weeks   Status On-going     OT SHORT TERM GOAL #4   Title Pt will improve RUE P/ROM to [facility] to improve ability to complete dressing tasks.    Time 4   Period Weeks   Status On-going     OT SHORT TERM GOAL #5   Status On-going           OT Long Term Goals - 11/24/16 1645      OT LONG TERM GOAL #1   Title Pt will return to highest level of functioning with ADL tasks using RUE as dominant.    Time 8   Period Weeks   Status On-going     OT LONG TERM GOAL #2   Title Pt will decrease RUE pain to 2/10 or less to improve ability to complete ADL tasks using RUE as dominant.    Time 8   Period Weeks   Status On-going     OT LONG TERM GOAL #3   Title Pt will decrease RUE fascial restrictions from min to trace amounts to improve mobility required for overhead reaching tasks.    Time 8   Period Weeks   Status On-going     OT LONG TERM GOAL #4   Title Pt will improve RUE A/ROM to [facility] to improve ability to reach into overhead cabinets/shelves for items.    Time 8   Period Weeks   Status On-going     OT LONG TERM GOAL #5   Title Pt will improve RUE strength to 4+/5 to improve ability to complete yardwork using RUE as dominant.    Time 8   Period Weeks   Status On-going               Plan - 12/07/16 1345    Clinical Impression Statement A:  Patient with improved passive range this date.  With moderate verbal guidance, patient is able to complete seated A/ROM and ball exercises with less compensatory movments.    Plan P:  Continue to follow protocol.  Patient will complete seated scapular mobility exercises with less verbal guidance for technique.  Patient will benefit from skilled therapeutic intervention in order to improve the following deficits and  impairments:  Impaired flexibility, Decreased strength, Decreased activity tolerance, Pain, Decreased range of motion, Increased fascial restricitons, Impaired UE functional use  Visit Diagnosis: Acute pain of right shoulder  Stiffness of right shoulder, not elsewhere classified  Other symptoms and signs involving the musculoskeletal system  Pain in right arm    Problem List Patient Active Problem List   Diagnosis Date Noted  . S/P right rotator cuff repair 10/06/2016  . Complete tear of left rotator cuff     Vangie Bicker, [place], OTR/L [phone]  12/07/2016, 2:57 PM  [place] [address] [place], [place], [address] Phone: [phone]   Fax:  [phone]  Name: Tammy [name] MRN: [id number] Date of Birth: [birth year]

## 2016-12-12 ENCOUNTER — Ambulatory Visit (HOSPITAL_COMMUNITY): Payer: Medicare Other

## 2016-12-12 ENCOUNTER — Encounter (HOSPITAL_COMMUNITY): Payer: Self-pay

## 2016-12-12 DIAGNOSIS — M25511 Pain in right shoulder: Secondary | ICD-10-CM | POA: Diagnosis not present

## 2016-12-12 DIAGNOSIS — M79601 Pain in right arm: Secondary | ICD-10-CM | POA: Diagnosis not present

## 2016-12-12 DIAGNOSIS — M25611 Stiffness of right shoulder, not elsewhere classified: Secondary | ICD-10-CM

## 2016-12-12 DIAGNOSIS — R29898 Other symptoms and signs involving the musculoskeletal system: Secondary | ICD-10-CM | POA: Diagnosis not present

## 2016-12-12 NOTE — Therapy (Signed)
[place], [place], [address] Phone: [phone]   Fax:  [phone]  Occupational Therapy Treatment  Patient Details  Name: Tammy [name] MRN: [id number] Date of Birth: 10-Aug-1946 Referring Provider: Dr. Arther Abbott  Encounter Date: 12/12/2016      OT End of Session - 12/12/16 1223    Visit Number 7   Number of Visits 24   Date for OT Re-Evaluation 01/21/17  mini reasess on 12/20/16   Authorization Type Medicare A & B   Authorization Time Period Before 10th visit   Authorization - Visit Number 7   Authorization - Number of Visits 10   OT Start Time 1115   OT Stop Time 1200   OT Time Calculation (min) 45 min   Activity Tolerance Patient tolerated treatment well   Behavior During Therapy [facility] for tasks assessed/performed      Past Medical History:  Diagnosis Date  . Hypertension     Past Surgical History:  Procedure Laterality Date  . ABDOMINAL HYSTERECTOMY  1988  . COLONOSCOPY N/A 08/11/2016   Procedure: COLONOSCOPY;  Surgeon: Rogene Houston, MD;  [facility];  Service: Endoscopy;  Laterality: N/A;  730  . ELBOW FRACTURE SURGERY    . KNEE ARTHROSCOPY    . SHOULDER OPEN ROTATOR CUFF REPAIR Right 10/06/2016   Procedure: OPEN ROTATOR CUFF REPAIR RIGHT SHOULDER;  Surgeon: Carole Civil, MD;  Location: [facility];  Service: Orthopedics;  Laterality: Right;    There were no vitals filed for this visit.      Subjective Assessment - 12/12/16 1136    Subjective  S: The other day my arm was so stiff.    Currently in Pain? Yes   Pain Score 5    Pain Location Shoulder   Pain Orientation Right   Pain Descriptors / Indicators Aching   Pain Type Acute pain            [facility] OT Assessment - 12/12/16 1136      Assessment   Diagnosis Right open rotator cuff repair     Precautions   Precautions Shoulder   Type of Shoulder Precautions P/ROM 12/26-1/23. AA/ROM and progress as tolerated    Shoulder Interventions Shoulder sling/immobilizer                  OT Treatments/Exercises (OP) - 12/12/16 1137      Exercises   Exercises Shoulder     Shoulder Exercises: Supine   Protraction PROM;5 reps   Horizontal ABduction PROM;5 reps   External Rotation PROM;5 reps   Internal Rotation PROM;5 reps   Flexion PROM;5 reps   ABduction PROM;5 reps     Shoulder Exercises: Seated   Elevation AROM;15 reps   Extension AROM;15 reps   Row AROM;15 reps     Shoulder Exercises: Therapy Ball   Flexion 20 reps   ABduction 20 reps     Shoulder Exercises: ROM/Strengthening   Thumb Tacks 1'   Anterior Glide 3X10"   Caudal Glide 3X10"   Prot/Ret//Elev/Dep 1'     Shoulder Exercises: Isometric Strengthening   Flexion Supine  3x10   Extension Supine  3x10   External Rotation Supine  3x10   Internal Rotation Supine  3x10   ABduction Supine  3x10   ADduction Supine  3x10     Manual Therapy   Manual Therapy Myofascial release   Manual therapy comments Manual therapy completed prior to exercises.   Myofascial  Release myofascial release and manual stretching to right upper arm, scapular region and associated regions to decrease pain and restrictions and improve pain free mobility in right shoulder region                  OT Short Term Goals - 11/24/16 1644      OT SHORT TERM GOAL #1   Title Pt will be provided and and educated on HEP.    Time 4   Period Weeks   Status On-going     OT SHORT TERM GOAL #2   Title Pt will decrease RUE pain to 4/10 to improve ability to complete ADL tasks using compensatory strategies.    Time 4   Period Days   Status On-going     OT SHORT TERM GOAL #3   Title Pt will decrease RUE fascial restrictions from moderate to minimal amounts to improve mobility required to complete HEP.    Time 4   Period Weeks   Status On-going     OT SHORT TERM GOAL #4   Title Pt will improve RUE P/ROM to [facility] to improve ability to  complete dressing tasks.    Time 4   Period Weeks   Status On-going     OT SHORT TERM GOAL #5   Status On-going           OT Long Term Goals - 11/24/16 1645      OT LONG TERM GOAL #1   Title Pt will return to highest level of functioning with ADL tasks using RUE as dominant.    Time 8   Period Weeks   Status On-going     OT LONG TERM GOAL #2   Title Pt will decrease RUE pain to 2/10 or less to improve ability to complete ADL tasks using RUE as dominant.    Time 8   Period Weeks   Status On-going     OT LONG TERM GOAL #3   Title Pt will decrease RUE fascial restrictions from min to trace amounts to improve mobility required for overhead reaching tasks.    Time 8   Period Weeks   Status On-going     OT LONG TERM GOAL #4   Title Pt will improve RUE A/ROM to [facility] to improve ability to reach into overhead cabinets/shelves for items.    Time 8   Period Weeks   Status On-going     OT LONG TERM GOAL #5   Title Pt will improve RUE strength to 4+/5 to improve ability to complete yardwork using RUE as dominant.    Time 8   Period Weeks   Status On-going               Plan - 12/12/16 1223    Clinical Impression Statement A: Pt required less verbal guidance with seated scapular mobility exercises this session as well as pro/ret/elev/dep. Pt following protocol and doing very well.    Plan P: Reassessment and progressing to AA/ROM next week at appointment on 1/22. For now continue to follow protocol. Increase isometrics to 5x10.      Patient will benefit from skilled therapeutic intervention in order to improve the following deficits and impairments:  Impaired flexibility, Decreased strength, Decreased activity tolerance, Pain, Decreased range of motion, Increased fascial restricitons, Impaired UE functional use  Visit Diagnosis: Acute pain of right shoulder  Stiffness of right shoulder, not elsewhere classified  Other symptoms and signs involving the  musculoskeletal system  Pain  in right arm    Problem List Patient Active Problem List   Diagnosis Date Noted  . S/P right rotator cuff repair 10/06/2016  . Complete tear of left rotator cuff    Ailene Ravel, OTR/L,CBIS  [phone]  12/12/2016, 12:25 PM  [place] [address] [place], [place], [address] Phone: [phone]   Fax:  [phone]  Name: Tammy [name] MRN: [id number] Date of Birth: 01/15/1946

## 2016-12-14 ENCOUNTER — Ambulatory Visit (HOSPITAL_COMMUNITY): Payer: Medicare Other | Admitting: Occupational Therapy

## 2016-12-19 ENCOUNTER — Ambulatory Visit (HOSPITAL_COMMUNITY): Payer: Medicare Other | Admitting: Specialist

## 2016-12-19 DIAGNOSIS — M25511 Pain in right shoulder: Secondary | ICD-10-CM

## 2016-12-19 DIAGNOSIS — M25611 Stiffness of right shoulder, not elsewhere classified: Secondary | ICD-10-CM

## 2016-12-19 DIAGNOSIS — R29898 Other symptoms and signs involving the musculoskeletal system: Secondary | ICD-10-CM

## 2016-12-19 DIAGNOSIS — M79601 Pain in right arm: Secondary | ICD-10-CM

## 2016-12-19 NOTE — Therapy (Signed)
[place], [place], [address] Phone: [phone]   Fax:  [phone]  Occupational Therapy Treatment  Patient Details  Name: Tammy [name] MRN: [id number] Date of Birth: 08-07-1946 Referring Provider: Dr. Arther Abbott  Encounter Date: 12/19/2016      OT End of Session - 12/19/16 1138    Visit Number 8   Number of Visits 24   Date for OT Re-Evaluation 01/21/17  reassess before md appointment on 12/27/16   Authorization Type  Medicare and b   Authorization Time Period Before 10th visit - complete foto and gcode update when completing reassessment for md visit on 1-30   Authorization - Visit Number 8   Authorization - Number of Visits 10   OT Start Time 6962   OT Stop Time 1115   OT Time Calculation (min) 43 min   Activity Tolerance Patient tolerated treatment well   Behavior During Therapy [facility] for tasks assessed/performed      Past Medical History:  Diagnosis Date  . Hypertension     Past Surgical History:  Procedure Laterality Date  . ABDOMINAL HYSTERECTOMY  1988  . COLONOSCOPY N/A 08/11/2016   Procedure: COLONOSCOPY;  Surgeon: Rogene Houston, MD;  [facility];  Service: Endoscopy;  Laterality: N/A;  730  . ELBOW FRACTURE SURGERY    . KNEE ARTHROSCOPY    . SHOULDER OPEN ROTATOR CUFF REPAIR Right 10/06/2016   Procedure: OPEN ROTATOR CUFF REPAIR RIGHT SHOULDER;  Surgeon: Carole Civil, MD;  Location: [facility];  Service: Orthopedics;  Laterality: Right;    There were no vitals filed for this visit.      Subjective Assessment - 12/19/16 1136    Subjective  S:  I think I have been doing much better, I don't feel as stiff any more.    Currently in Pain? Yes   Pain Score 3    Pain Location Shoulder   Pain Orientation Right   Pain Descriptors / Indicators Aching   Pain Type Acute pain   Pain Onset More than a month ago   Pain Frequency Intermittent   Aggravating Factors  extended  sling use   Pain Relieving Factors rest, weaning from sling at home   Effect of Pain on Daily Activities decreased use of right arm with functional activities             [facility] OT Assessment - 12/19/16 0001      Assessment   Diagnosis Right open rotator cuff repair     Precautions   Precautions Shoulder   Type of Shoulder Precautions P/ROM 12/26-1/23. AA/ROM and progress as tolerated   Shoulder Interventions Shoulder sling/immobilizer     ADL   ADL comments improved ease with dressing and undressing, improved comfort, improved abiilty to use right arm as an active assist at waist level.  unable to reach above waist height, unable to drive      PROM   Overall PROM Comments Assessed supine, er/IR adducted (11/22/16)   Right Shoulder Flexion 160 Degrees  86   Right Shoulder ABduction 165 Degrees  51   Right Shoulder Internal Rotation 90 Degrees  90   Right Shoulder External Rotation 50 Degrees  10                  OT Treatments/Exercises (OP) - 12/19/16 0001      Exercises   Exercises Shoulder     Shoulder Exercises: Supine   Protraction  PROM;5 reps;AAROM;10 reps   Protraction Limitations therapist provided min pa   Horizontal ABduction PROM;5 reps;AAROM;10 reps   Horizontal ABduction Limitations therapist provided min pa   External Rotation PROM;5 reps;AAROM;10 reps   External Rotation Limitations with towel roll in axilla   Internal Rotation PROM;5 reps;AAROM;10 reps   Internal Rotation Limitations with towel roll in axilla   Flexion PROM;AAROM;10 reps   ABduction PROM;AAROM;10 reps     Shoulder Exercises: Seated   Elevation AROM;10 reps   Extension AROM;10 reps   Row AROM;10 reps     Shoulder Exercises: Pulleys   Flexion 1 minute   ABduction 1 minute     Shoulder Exercises: ROM/Strengthening   Wall Wash 1 minute     Manual Therapy   Manual Therapy Myofascial release   Manual therapy comments Manual therapy completed prior to exercises.    Myofascial Release myofascial release and manual stretching to right upper arm, scapular region and associated regions to decrease pain and restrictions and improve pain free mobility in right shoulder region                  OT Short Term Goals - 12/19/16 1142      OT SHORT TERM GOAL #1   Title Pt will be provided and and educated on HEP.    Time 4   Period Weeks   Status On-going     OT SHORT TERM GOAL #2   Title Pt will decrease RUE pain to 4/10 to improve ability to complete ADL tasks using compensatory strategies.    Time 4   Period Days   Status Achieved     OT SHORT TERM GOAL #3   Title Pt will decrease RUE fascial restrictions from moderate to minimal amounts to improve mobility required to complete HEP.    Time 4   Period Weeks   Status Achieved     OT SHORT TERM GOAL #4   Title Pt will improve RUE P/ROM to [facility] to improve ability to complete dressing tasks.    Time 4   Period Weeks   Status Achieved     OT SHORT TERM GOAL #5   Status On-going           OT Long Term Goals - 12/19/16 1143      OT LONG TERM GOAL #1   Title Pt will return to highest level of functioning with ADL tasks using RUE as dominant.    Time 8   Period Weeks   Status On-going     OT LONG TERM GOAL #2   Title Pt will decrease RUE pain to 2/10 or less to improve ability to complete ADL tasks using RUE as dominant.    Time 8   Period Weeks   Status On-going     OT LONG TERM GOAL #3   Title Pt will decrease RUE fascial restrictions from min to trace amounts to improve mobility required for overhead reaching tasks.    Time 8   Period Weeks   Status On-going     OT LONG TERM GOAL #4   Title Pt will improve RUE A/ROM to [facility] to improve ability to reach into overhead cabinets/shelves for items.    Time 8   Period Weeks   Status On-going     OT LONG TERM GOAL #5   Title Pt will improve RUE strength to 4+/5 to improve ability to complete yardwork using RUE as dominant.     Time 8  Period Weeks   Status On-going               Plan - 12/19/16 1140    Clinical Impression Statement A:  Patient with significantly less tightness and restrictions.  Initiated AA/ROM this date per protocol.  Patient completed with dowel rod and occassional unweighting from therapist.  P/ROM is WFL in supine.  Patient has met all short term goals.  Patient will benefit from continued skilled OT intervention to improve AA/ROM, A/ROM, and strength per protocol needed to return to prior level of independence and use of right arm as dominant with funcitonal activities.     OT Frequency 2x / week   OT Duration 4 weeks   OT Treatment/Interventions Therapeutic exercise;Patient/family education;Manual Therapy;Therapeutic activities;Cryotherapy;Electrical Stimulation;Moist Heat;Passive range of motion   Plan P:  Increase independence with AA/ROM and issues as an updated HEP.        Patient will benefit from skilled therapeutic intervention in order to improve the following deficits and impairments:  Impaired flexibility, Decreased strength, Decreased activity tolerance, Pain, Decreased range of motion, Increased fascial restricitons, Impaired UE functional use  Visit Diagnosis: Acute pain of right shoulder  Stiffness of right shoulder, not elsewhere classified  Other symptoms and signs involving the musculoskeletal system  Pain in right arm    Problem List Patient Active Problem List   Diagnosis Date Noted  . S/P right rotator cuff repair 10/06/2016  . Complete tear of left rotator cuff     Vangie Bicker, [place], OTR/L [phone]  12/19/2016, 11:48 AM  [place] [address] [place], [place], [address] Phone: [phone]   Fax:  [phone]  Name: AHJA MARTELLO MRN: [id number] Date of Birth: [birth year]

## 2016-12-21 ENCOUNTER — Encounter (HOSPITAL_COMMUNITY): Payer: Self-pay | Admitting: Occupational Therapy

## 2016-12-21 ENCOUNTER — Ambulatory Visit (HOSPITAL_COMMUNITY): Payer: Medicare Other | Admitting: Occupational Therapy

## 2016-12-21 DIAGNOSIS — M79601 Pain in right arm: Secondary | ICD-10-CM | POA: Diagnosis not present

## 2016-12-21 DIAGNOSIS — M25511 Pain in right shoulder: Secondary | ICD-10-CM | POA: Diagnosis not present

## 2016-12-21 DIAGNOSIS — M25611 Stiffness of right shoulder, not elsewhere classified: Secondary | ICD-10-CM | POA: Diagnosis not present

## 2016-12-21 DIAGNOSIS — R29898 Other symptoms and signs involving the musculoskeletal system: Secondary | ICD-10-CM | POA: Diagnosis not present

## 2016-12-21 NOTE — Therapy (Signed)
[name], [place], [address] Phone: [phone]   Fax:  [phone]  Occupational Therapy Treatment  Patient Details  Name: Tammy [name] MRN: [id number] Date of Birth: Apr 03, 1946 Referring Provider: Dr. Arther Abbott  Encounter Date: 12/21/2016      OT End of Session - 12/21/16 1157    Visit Number 9   Number of Visits 24   Date for OT Re-Evaluation 01/21/17   Authorization Type  Medicare and b   Authorization Time Period Before 10th visit - complete foto and gcode update when completing reassessment for md visit on 1-30   Authorization - Visit Number 9   Authorization - Number of Visits 10   OT Start Time 1031   OT Stop Time 1114   OT Time Calculation (min) 43 min   Activity Tolerance Patient tolerated treatment well   Behavior During Therapy [facility] for tasks assessed/performed      Past Medical History:  Diagnosis Date  . Hypertension     Past Surgical History:  Procedure Laterality Date  . ABDOMINAL HYSTERECTOMY  1988  . COLONOSCOPY N/A 08/11/2016   Procedure: COLONOSCOPY;  Surgeon: Rogene Houston, MD;  [facility]: [facility];  Service: Endoscopy;  Laterality: N/A;  730  . ELBOW FRACTURE SURGERY    . KNEE ARTHROSCOPY    . SHOULDER OPEN ROTATOR CUFF REPAIR Right 10/06/2016   Procedure: OPEN ROTATOR CUFF REPAIR RIGHT SHOULDER;  Surgeon: Carole Civil, MD;  Location: [facility];  Service: Orthopedics;  Laterality: Right;    There were no vitals filed for this visit.      Subjective Assessment - 12/21/16 1029    Subjective  S: I can tell a world of difference in this shoulder.    Currently in Pain? No/denies            [facility] OT Assessment - 12/21/16 1029      Assessment   Diagnosis Right open rotator cuff repair     Precautions   Precautions Shoulder   Type of Shoulder Precautions P/ROM 12/26-1/23. AA/ROM and progress as tolerated   Shoulder Interventions Shoulder sling/immobilizer                   OT Treatments/Exercises (OP) - 12/21/16 1033      Exercises   Exercises Shoulder     Shoulder Exercises: Supine   Protraction PROM;5 reps;AAROM;10 reps   Horizontal ABduction PROM;5 reps;AAROM;10 reps   External Rotation PROM;5 reps;AAROM;10 reps   External Rotation Limitations with towel roll in axilla   Internal Rotation PROM;5 reps;AAROM;10 reps   Internal Rotation Limitations with towel roll in axilla   Flexion PROM;AAROM;10 reps   ABduction PROM;AAROM;10 reps     Shoulder Exercises: Seated   Protraction AAROM;10 reps   External Rotation AAROM;10 reps   Internal Rotation AAROM;10 reps   Flexion AAROM;10 reps     Shoulder Exercises: Pulleys   Flexion 1 minute   ABduction 1 minute     Shoulder Exercises: ROM/Strengthening   Wall Wash 1 minute   Thumb Tacks 1'   Prot/Ret//Elev/Dep 1'     Manual Therapy   Manual Therapy Myofascial release   Manual therapy comments Manual therapy completed prior to exercises.   Myofascial Release myofascial release and manual stretching to right upper arm, scapular region and associated regions to decrease pain and restrictions and improve pain free mobility in right shoulder region  OT Education - 12/21/16 1052    Education provided Yes   Education Details AA/ROM exercises   Person(s) Educated Patient   Methods Explanation;Demonstration;Handout   Comprehension Verbalized understanding;Returned demonstration          OT Short Term Goals - 12/19/16 1142      OT SHORT TERM GOAL #1   Title Pt will be provided and and educated on HEP.    Time 4   Period Weeks   Status On-going     OT SHORT TERM GOAL #2   Title Pt will decrease RUE pain to 4/10 to improve ability to complete ADL tasks using compensatory strategies.    Time 4   Period Days   Status Achieved     OT SHORT TERM GOAL #3   Title Pt will decrease RUE fascial restrictions from moderate to minimal amounts to improve  mobility required to complete HEP.    Time 4   Period Weeks   Status Achieved     OT SHORT TERM GOAL #4   Title Pt will improve RUE P/ROM to [facility] to improve ability to complete dressing tasks.    Time 4   Period Weeks   Status Achieved     OT SHORT TERM GOAL #5   Status On-going           OT Long Term Goals - 12/19/16 1143      OT LONG TERM GOAL #1   Title Pt will return to highest level of functioning with ADL tasks using RUE as dominant.    Time 8   Period Weeks   Status On-going     OT LONG TERM GOAL #2   Title Pt will decrease RUE pain to 2/10 or less to improve ability to complete ADL tasks using RUE as dominant.    Time 8   Period Weeks   Status On-going     OT LONG TERM GOAL #3   Title Pt will decrease RUE fascial restrictions from min to trace amounts to improve mobility required for overhead reaching tasks.    Time 8   Period Weeks   Status On-going     OT LONG TERM GOAL #4   Title Pt will improve RUE A/ROM to [facility] to improve ability to reach into overhead cabinets/shelves for items.    Time 8   Period Weeks   Status On-going     OT LONG TERM GOAL #5   Title Pt will improve RUE strength to 4+/5 to improve ability to complete yardwork using RUE as dominant.    Time 8   Period Weeks   Status On-going               Plan - 12/21/16 1157    Clinical Impression Statement A: Pt able to complete supine exercises with no physcial assistance this session, verbal cuing for form and technique. Added AA/ROM in sitting with exception of horizontal abduction and abduction. Verbal cuing required during session for depressing shoulder, pt able to correct with mild difficulty. Pt reports arm feeling heavy and with mild fatigue at end of session. Provided AA/ROM HEP with instructions to complete in supine.    Plan P: UPDATE G-CODE. Add horizontal abduction and abduction with seated AA/ROM if pt able to perform with good form. continue to work on depressing shoulder  during exercises.    OT Home Exercise Plan 12/26: table slides; 1/2: isometric strengthening; 1/24: AA/ROM in supine   Consulted and Agree with Plan of  Care Patient      Patient will benefit from skilled therapeutic intervention in order to improve the following deficits and impairments:  Impaired flexibility, Decreased strength, Decreased activity tolerance, Pain, Decreased range of motion, Increased fascial restricitons, Impaired UE functional use  Visit Diagnosis: Acute pain of right shoulder  Stiffness of right shoulder, not elsewhere classified  Other symptoms and signs involving the musculoskeletal system    Problem List Patient Active Problem List   Diagnosis Date Noted  . S/P right rotator cuff repair 10/06/2016  . Complete tear of left rotator cuff    Guadelupe Sabin, OTR/L  [phone] 12/21/2016, 12:09 PM  [place] [address] [place], [place], [address] Phone: [phone]   Fax:  [phone]  Name: TANVEER DOBBERSTEIN MRN: [id number] Date of Birth: 10-31-1946

## 2016-12-21 NOTE — Patient Instructions (Signed)
Perform each exercise ____10____ reps. 2-3x days.   Protraction - STANDING  Start by holding a wand or cane at chest height.  Next, slowly push the wand outwards in front of your body so that your elbows become fully straightened. Then, return to the original position.     Shoulder FLEXION - STANDING - PALMS UP  In the standing position, hold a wand/cane with both arms, palms up on both sides. Raise up the wand/cane allowing your unaffected arm to perform most of the effort. Your affected arm should be partially relaxed.      Internal/External ROTATION - STANDING  In the standing position, hold a wand/cane with both hands keeping your elbows bent. Move your arms and wand/cane to one side.  Your affected arm should be partially relaxed while your unaffected arm performs most of the effort.       Shoulder ABDUCTION - STANDING  While holding a wand/cane palm face up on the injured side and palm face down on the uninjured side, slowly raise up your injured arm to the side.                     Horizontal Abduction/Adduction      Straight arms holding cane at shoulder height, bring cane to right, center, left. Repeat starting to left.   Copyright  VHI. All rights reserved.       

## 2016-12-26 ENCOUNTER — Ambulatory Visit (HOSPITAL_COMMUNITY): Payer: Medicare Other | Admitting: Specialist

## 2016-12-26 DIAGNOSIS — M25511 Pain in right shoulder: Secondary | ICD-10-CM

## 2016-12-26 DIAGNOSIS — M79601 Pain in right arm: Secondary | ICD-10-CM | POA: Diagnosis not present

## 2016-12-26 DIAGNOSIS — R29898 Other symptoms and signs involving the musculoskeletal system: Secondary | ICD-10-CM | POA: Diagnosis not present

## 2016-12-26 DIAGNOSIS — M25611 Stiffness of right shoulder, not elsewhere classified: Secondary | ICD-10-CM

## 2016-12-26 NOTE — Therapy (Signed)
Tammy [name], [place], [address] Phone: [phone]   Fax:  [phone]  Occupational Therapy Treatment  Patient Details  Name: Tammy [name] MRN: [id number] Date of Birth: 1946/03/10 Referring Provider: Dr. Arther Abbott  Encounter Date: 12/26/2016      OT End of Session - 12/26/16 1141    Visit Number 10   Number of Visits 24   Date for OT Re-Evaluation 01/21/17   Authorization Type  Medicare a and b   Authorization Time Period before 20th visit   Authorization - Visit Number 10   Authorization - Number of Visits 20   OT Start Time [id number]   OT Stop Time 1120   OT Time Calculation (min) 44 min   Activity Tolerance Patient tolerated treatment well   Behavior During Therapy [facility] for tasks assessed/performed      Past Medical History:  Diagnosis Date  . Hypertension     Past Surgical History:  Procedure Laterality Date  . ABDOMINAL HYSTERECTOMY  1988  . COLONOSCOPY N/A 08/11/2016   Procedure: COLONOSCOPY;  Surgeon: Rogene Houston, MD;  [facility];  Service: Endoscopy;  Laterality: N/A;  730  . ELBOW FRACTURE SURGERY    . KNEE ARTHROSCOPY    . SHOULDER OPEN ROTATOR CUFF REPAIR Right 10/06/2016   Procedure: OPEN ROTATOR CUFF REPAIR RIGHT SHOULDER;  Surgeon: Carole Civil, MD;  Location: [facility];  Service: Orthopedics;  Laterality: Right;    There were no vitals filed for this visit.      Subjective Assessment - 12/26/16 1140    Subjective  S:  I can put my shirt on over my head today for the first time.    Special Tests FOTO 63%   Currently in Pain? Yes   Pain Score 3    Pain Location Shoulder   Pain Orientation Right   Pain Descriptors / Indicators Aching   Pain Type Acute pain   Pain Onset More than a month ago   Pain Frequency Intermittent   Aggravating Factors  extended sling use   Pain Relieving Factors rest, weaning from sling at home   Effect of Pain on Daily Activities  decreased use of right arm as dominant             [facility] OT Assessment - 12/26/16 0001      Assessment   Diagnosis Right open rotator cuff repair   Referring Provider Dr. Arther Abbott     Precautions   Precautions Shoulder   Type of Shoulder Precautions P/ROM 12/26-1/23. AA/ROM and progress as tolerated   Shoulder Interventions Shoulder sling/immobilizer     Prior Function   Level of Independence Independent   Vocation Retired   Leisure church activities; staying with grandkids; yardwork     ADL   ADL comments improved ease with reaching to shoulder height, combing hair, donning shirts overhead, can not lift or reach overhead       Palpation   Palpation comment minimal fascial restrictions in right shoulder region     AROM   Overall AROM Comments assessed in supine and seated, external rotation and internal rotation with shoulder adducted    Right Shoulder Flexion 165 Degrees  seated 140   Right Shoulder ABduction 165 Degrees  seated 110   Right Shoulder Internal Rotation 90 Degrees  seated 90   Right Shoulder External Rotation 50 Degrees  seated 75     PROM   Overall PROM Comments  Assessed supine, er/IR adducted (12/19/16)   Right Shoulder Flexion 170 Degrees  160   Right Shoulder ABduction 170 Degrees  165   Right Shoulder Internal Rotation 90 Degrees  90   Right Shoulder External Rotation 80 Degrees  50     Strength   Overall Strength Comments assessed in seated   Right Shoulder Flexion 3+/5   Right Shoulder ABduction 3+/5   Right Shoulder Internal Rotation 4-/5   Right Shoulder External Rotation 3+/5                  OT Treatments/Exercises (OP) - 12/26/16 0001      Exercises   Exercises Shoulder     Shoulder Exercises: Supine   Protraction PROM;5 reps;AAROM;15 reps   Horizontal ABduction PROM;5 reps;AAROM;15 reps   External Rotation PROM;5 reps;AAROM;15 reps   Internal Rotation PROM;5 reps;AAROM;15 reps   Flexion PROM;5  reps;AAROM;15 reps   ABduction PROM;5 reps;AAROM;15 reps     Shoulder Exercises: Seated   Protraction AAROM;10 reps  max vg to depress shoulder blade   Horizontal ABduction AAROM;10 reps   Horizontal ABduction Limitations max vg and mod tactile cues to depress shoulder blade   External Rotation AAROM;10 reps   Internal Rotation AAROM;10 reps   Flexion AAROM;10 reps   Flexion Limitations max vg and min tactile cues to depress shoulder blade stopped at 90 degrees to prevent compensatory movements.    Abduction AAROM;10 reps   ABduction Limitations max vg and min tactile cues to depress shoulder blade, stopped at 70 degrees to avoid compensatory movements      Manual Therapy   Manual Therapy Myofascial release   Manual therapy comments Manual therapy completed prior to exercises.   Myofascial Release myofascial release and manual stretching to right upper arm, scapular region and associated regions to decrease pain and restrictions and improve pain free mobility in right shoulder region                  OT Short Term Goals - 12/26/16 1107      OT SHORT TERM GOAL #1   Title Pt will be provided and and educated on HEP.    Time 4   Period Weeks   Status On-going     OT SHORT TERM GOAL #2   Title Pt will decrease RUE pain to 4/10 to improve ability to complete ADL tasks using compensatory strategies.    Time 4   Period Weeks   Status Achieved     OT SHORT TERM GOAL #3   Title Pt will decrease RUE fascial restrictions from moderate to minimal amounts to improve mobility required to complete HEP.    Time 4   Period Weeks   Status Achieved     OT SHORT TERM GOAL #4   Title Pt will improve RUE P/ROM to [facility] to improve ability to complete dressing tasks.    Time 4   Period Weeks   Status Achieved     OT SHORT TERM GOAL #5   Status Achieved           OT Long Term Goals - 12/26/16 1108      OT LONG TERM GOAL #1   Title Pt will return to highest level of  functioning with ADL tasks using RUE as dominant.    Time 8   Period Weeks   Status On-going     OT LONG TERM GOAL #2   Title Pt will decrease RUE pain to 2/10 or  less to improve ability to complete ADL tasks using RUE as dominant.    Time 8   Period Weeks   Status On-going     OT LONG TERM GOAL #3   Title Pt will decrease RUE fascial restrictions from min to trace amounts to improve mobility required for overhead reaching tasks.    Time 8   Period Weeks   Status On-going     OT LONG TERM GOAL #4   Title Pt will improve RUE A/ROM to [facility] to improve ability to reach into overhead cabinets/shelves for items.    Time 8   Period Weeks   Status On-going     OT LONG TERM GOAL #5   Title Pt will improve RUE strength to 4+/5 to improve ability to complete yardwork using RUE as dominant.    Time 8   Period Weeks   Status On-going               Plan - 2016/12/27 1142    Clinical Impression Statement A:  Patient is making excellent progress towards her goals in occupational therapy.  She has improved use of her dominant right arm with BADLs, such as dressing, bathing, and grooming.  She can reach to comb her hair, don and doff shirts, and wash dishes with her right arm.  She continues to have difficulty completiing function activities above shoulder height.  She is not able to lift anyting heavy.  Paitent would benefit from continued skilled OT intervention to improve right shoulder A/ROM and strength to WNL in order to return to prior level of independence with functional activities.    OT Frequency 2x / week   OT Duration 4 weeks   OT Treatment/Interventions Therapeutic exercise;Patient/family education;Manual Therapy;Therapeutic activities;Cryotherapy;Electrical Stimulation;Moist Heat;Passive range of motion   Plan P:  Attempt A/ROM in supine, complete AA/ROM in seated without compensatory shoulder elevation with overhead AA/ROM.     Consulted and Agree with Plan of Care Patient       Patient will benefit from skilled therapeutic intervention in order to improve the following deficits and impairments:  Impaired flexibility, Decreased strength, Decreased activity tolerance, Pain, Decreased range of motion, Increased fascial restricitons, Impaired UE functional use  Visit Diagnosis: Acute pain of right shoulder  Stiffness of right shoulder, not elsewhere classified  Other symptoms and signs involving the musculoskeletal system      G-Codes - 27-Dec-2016 1145    Functional Assessment Tool Used FOTO scored 64% independent, 36% impaired   Functional Limitation Carrying, moving and handling objects   Carrying, Moving and Handling Objects Current Status [id number]) At least 20 percent but less than 40 percent impaired, limited or restricted   Carrying, Moving and Handling Objects Goal Status [id number]) At least 1 percent but less than 20 percent impaired, limited or restricted      Problem List Patient Active Problem List   Diagnosis Date Noted  . S/P right rotator cuff repair 10/06/2016  . Complete tear of left rotator cuff     Vangie Bicker, [place], OTR/L [phone]  27-Dec-2016, 11:57 AM  [facility] [address] [place], [place], [address] Phone: [phone]   Fax:  [phone]  Name: LIZANIA EICKMAN MRN: [id number] Date of Birth: 1946-08-17

## 2016-12-28 ENCOUNTER — Encounter: Payer: Self-pay | Admitting: Orthopedic Surgery

## 2016-12-28 ENCOUNTER — Ambulatory Visit (HOSPITAL_COMMUNITY): Payer: Medicare Other | Admitting: Specialist

## 2016-12-28 ENCOUNTER — Ambulatory Visit (INDEPENDENT_AMBULATORY_CARE_PROVIDER_SITE_OTHER): Payer: Self-pay | Admitting: Orthopedic Surgery

## 2016-12-28 DIAGNOSIS — M25611 Stiffness of right shoulder, not elsewhere classified: Secondary | ICD-10-CM

## 2016-12-28 DIAGNOSIS — M25511 Pain in right shoulder: Secondary | ICD-10-CM

## 2016-12-28 DIAGNOSIS — Z9889 Other specified postprocedural states: Secondary | ICD-10-CM

## 2016-12-28 DIAGNOSIS — R29898 Other symptoms and signs involving the musculoskeletal system: Secondary | ICD-10-CM

## 2016-12-28 DIAGNOSIS — M79601 Pain in right arm: Secondary | ICD-10-CM | POA: Diagnosis not present

## 2016-12-28 MED ORDER — IBUPROFEN 800 MG PO TABS: 800.0000 mg | ORAL_TABLET | Freq: Three times a day (TID) | ORAL | 1 refills | Status: DC | PRN

## 2016-12-28 NOTE — Progress Notes (Signed)
Postop follow-up status post open right rotator cuff repair on November 9 12 weeks postop  The patient has active flexion of 140 passive range of motion 160  Her pain is controlled with ibuprofen  She has some scapular substitution but it is minimal  Continue therapy twice week 4 weeks remove sling okay to drive no heavy lifting overhead follow-up in 6 weeks

## 2016-12-28 NOTE — Patient Instructions (Signed)
Continue therapy Ibuprofen sent to pharmacy

## 2016-12-28 NOTE — Therapy (Signed)
[place], [place], [address] Phone: [phone]   Fax:  [phone]  Occupational Therapy Treatment  Patient Details  Name: Tammy [name] MRN: [id number] Date of Birth: 23-May-1946 Referring Provider: Dr. Arther Abbott  Encounter Date: 12/28/2016      OT End of Session - 12/28/16 1013    Visit Number 11   Number of Visits 24   Date for OT Re-Evaluation 01/21/17   Authorization Type  Medicare a and b   Authorization Time Period before 20th visit   Authorization - Visit Number 11   Authorization - Number of Visits 20   OT Start Time 0908   OT Stop Time 0955   OT Time Calculation (min) 47 min   Activity Tolerance Patient tolerated treatment well   Behavior During Therapy [facility] for tasks assessed/performed      Past Medical History:  Diagnosis Date  . Hypertension     Past Surgical History:  Procedure Laterality Date  . ABDOMINAL HYSTERECTOMY  1988  . COLONOSCOPY N/A 08/11/2016   Procedure: COLONOSCOPY;  Surgeon: Rogene Houston, MD;  [facility];  Service: Endoscopy;  Laterality: N/A;  730  . ELBOW FRACTURE SURGERY    . KNEE ARTHROSCOPY    . SHOULDER OPEN ROTATOR CUFF REPAIR Right 10/06/2016   Procedure: OPEN ROTATOR CUFF REPAIR RIGHT SHOULDER;  Surgeon: Carole Civil, MD;  Location: [facility];  Service: Orthopedics;  Laterality: Right;    There were no vitals filed for this visit.      Subjective Assessment - 12/28/16 1012    Subjective  S:  I have been cleaning out closets and cabinets, it feels good to reach and use my arm.   Currently in Pain? Yes   Pain Score 3    Pain Location Shoulder   Pain Orientation Right   Pain Descriptors / Indicators Aching   Pain Type Acute pain            [facility] OT Assessment - 12/28/16 0001      Assessment   Diagnosis Right open rotator cuff repair     Precautions   Precautions Shoulder   Type of Shoulder Precautions P/ROM 12/26-1/23.  AA/ROM and progress as tolerated   Shoulder Interventions Shoulder sling/immobilizer                  OT Treatments/Exercises (OP) - 12/28/16 0001      Exercises   Exercises Shoulder     Shoulder Exercises: Supine   Protraction PROM;5 reps;AROM;10 reps   Horizontal ABduction PROM;5 reps;AROM;10 reps   External Rotation PROM;5 reps;AROM;10 reps   Internal Rotation PROM;5 reps;AROM;10 reps   Flexion PROM;5 reps;AROM;10 reps   ABduction PROM;5 reps;AROM;10 reps     Shoulder Exercises: Seated   Extension Theraband;10 reps   Theraband Level (Shoulder Extension) Level 2 (Red)   Retraction 10 reps;Theraband   Theraband Level (Shoulder Retraction) Level 2 (Red)   Row Theraband;10 reps   Theraband Level (Shoulder Row) Level 2 (Red)   Protraction AAROM;10 reps   Protraction Limitations less compensatory movement this date   Horizontal ABduction AAROM;10 reps   Horizontal ABduction Limitations less compensatory movements this date    External Rotation AAROM;10 reps   Internal Rotation AAROM;10 reps   Flexion AAROM;10 reps   Flexion Limitations cued patient to push dowel down towards toes before bringing up into flexion to decrease compensatory movements   Abduction AAROM;10 reps   ABduction Limitations  cued patient to bring dowel down towards toes and then up into abduction to decrease compensatory movements     Shoulder Exercises: Therapy Ball   Right/Left 5 reps     Manual Therapy   Manual Therapy Myofascial release   Manual therapy comments Manual therapy completed prior to exercises.   Myofascial Release myofascial release and manual stretching to right upper arm, scapular region and associated regions to decrease pain and restrictions and improve pain free mobility in right shoulder region1/31/15:  added scar release and scar friction massage to decrease fascial and scar restrictions                   OT Short Term Goals - 12/26/16 1107      OT SHORT TERM  GOAL #1   Title Pt will be provided and and educated on HEP.    Time 4   Period Weeks   Status On-going     OT SHORT TERM GOAL #2   Title Pt will decrease RUE pain to 4/10 to improve ability to complete ADL tasks using compensatory strategies.    Time 4   Period Weeks   Status Achieved     OT SHORT TERM GOAL #3   Title Pt will decrease RUE fascial restrictions from moderate to minimal amounts to improve mobility required to complete HEP.    Time 4   Period Weeks   Status Achieved     OT SHORT TERM GOAL #4   Title Pt will improve RUE P/ROM to [facility] to improve ability to complete dressing tasks.    Time 4   Period Weeks   Status Achieved     OT SHORT TERM GOAL #5   Status Achieved           OT Long Term Goals - 12/26/16 1108      OT LONG TERM GOAL #1   Title Pt will return to highest level of functioning with ADL tasks using RUE as dominant.    Time 8   Period Weeks   Status On-going     OT LONG TERM GOAL #2   Title Pt will decrease RUE pain to 2/10 or less to improve ability to complete ADL tasks using RUE as dominant.    Time 8   Period Weeks   Status On-going     OT LONG TERM GOAL #3   Title Pt will decrease RUE fascial restrictions from min to trace amounts to improve mobility required for overhead reaching tasks.    Time 8   Period Weeks   Status On-going     OT LONG TERM GOAL #4   Title Pt will improve RUE A/ROM to [facility] to improve ability to reach into overhead cabinets/shelves for items.    Time 8   Period Weeks   Status On-going     OT LONG TERM GOAL #5   Title Pt will improve RUE strength to 4+/5 to improve ability to complete yardwork using RUE as dominant.    Time 8   Period Weeks   Status On-going               Plan - 12/28/16 1014    Clinical Impression Statement A:  Patient is progressing well towards OT goals.  transitioned to A/ROM in supine this date, and completed AA/ROM in seated with less compensatory shoulder elevation.  Added  scapular strengthening with light resist theraband.    Plan P: Add external rotation and internal rotation theraband,  add x to v and w arms for scapular stability.        Patient will benefit from skilled therapeutic intervention in order to improve the following deficits and impairments:  Impaired flexibility, Decreased strength, Decreased activity tolerance, Pain, Decreased range of motion, Increased fascial restricitons, Impaired UE functional use  Visit Diagnosis: Acute pain of right shoulder  Stiffness of right shoulder, not elsewhere classified  Other symptoms and signs involving the musculoskeletal system    Problem List Patient Active Problem List   Diagnosis Date Noted  . S/P right rotator cuff repair 10/06/2016  . Complete tear of left rotator cuff    Vangie Bicker, [place], OTR/L [phone]  12/28/2016, 10:21 AM  [facility] [address] [place], [place], [address] Phone: [phone]   Fax:  [phone]  Name: LATORIA LIZCANO MRN: [id number] Date of Birth: [birth year]

## 2017-01-02 ENCOUNTER — Ambulatory Visit (HOSPITAL_COMMUNITY): Payer: Medicare Other | Admitting: Specialist

## 2017-01-04 ENCOUNTER — Ambulatory Visit (HOSPITAL_COMMUNITY): Payer: Medicare Other | Attending: Orthopedic Surgery | Admitting: Occupational Therapy

## 2017-01-04 ENCOUNTER — Encounter (HOSPITAL_COMMUNITY): Payer: Self-pay | Admitting: Occupational Therapy

## 2017-01-04 DIAGNOSIS — R29898 Other symptoms and signs involving the musculoskeletal system: Secondary | ICD-10-CM | POA: Diagnosis not present

## 2017-01-04 DIAGNOSIS — M25511 Pain in right shoulder: Secondary | ICD-10-CM | POA: Insufficient documentation

## 2017-01-04 DIAGNOSIS — M25611 Stiffness of right shoulder, not elsewhere classified: Secondary | ICD-10-CM | POA: Diagnosis not present

## 2017-01-04 NOTE — Therapy (Signed)
[place], [place], [address] Phone: [phone]   Fax:  [phone]  Occupational Therapy Treatment  Patient Details  Name: Tammy [name] MRN: [id number] Date of Birth: October 26, 1946 Referring Provider: Dr. Arther Abbott  Encounter Date: 01/04/2017      OT End of Session - 01/04/17 1235    Visit Number 12   Number of Visits 24   Date for OT Re-Evaluation 01/21/17   Authorization Type  Medicare a and b   Authorization Time Period before 20th visit   Authorization - Visit Number 12   Authorization - Number of Visits 20   OT Start Time 1030   OT Stop Time 1113   OT Time Calculation (min) 43 min   Activity Tolerance Patient tolerated treatment well   Behavior During Therapy [facility] for tasks assessed/performed      Past Medical History:  Diagnosis Date  . Hypertension     Past Surgical History:  Procedure Laterality Date  . ABDOMINAL HYSTERECTOMY  1988  . COLONOSCOPY N/A 08/11/2016   Procedure: COLONOSCOPY;  Surgeon: Rogene Houston, MD;  [facility];  Service: Endoscopy;  Laterality: N/A;  730  . ELBOW FRACTURE SURGERY    . KNEE ARTHROSCOPY    . SHOULDER OPEN ROTATOR CUFF REPAIR Right 10/06/2016   Procedure: OPEN ROTATOR CUFF REPAIR RIGHT SHOULDER;  Surgeon: Carole Civil, MD;  Location: [facility];  Service: Orthopedics;  Laterality: Right;    There were no vitals filed for this visit.      Subjective Assessment - 01/04/17 1027    Subjective  S: The doctor was really pleased when I was there last week.    Currently in Pain? Yes   Pain Score 8    Pain Location Shoulder   Pain Orientation Right   Pain Descriptors / Indicators Aching   Pain Type Acute pain   Pain Radiating Towards n/a   Pain Onset More than a month ago   Pain Frequency Intermittent   Aggravating Factors  rain   Pain Relieving Factors rest   Effect of Pain on Daily Activities decreased use of RUE as dominant   Multiple  Pain Sites No            [facility] OT Assessment - 01/04/17 1027      Assessment   Diagnosis Right open rotator cuff repair     Precautions   Precautions Shoulder   Type of Shoulder Precautions P/ROM 12/26-1/23. AA/ROM and progress as tolerated   Shoulder Interventions Shoulder sling/immobilizer                  OT Treatments/Exercises (OP) - 01/04/17 1033      Exercises   Exercises Shoulder     Shoulder Exercises: Supine   Protraction PROM;5 reps;AROM;10 reps   Horizontal ABduction PROM;5 reps;AROM;10 reps   External Rotation PROM;5 reps;AROM;10 reps   Internal Rotation PROM;5 reps;AROM;10 reps   Flexion PROM;5 reps;AROM;10 reps   ABduction PROM;5 reps;AROM;10 reps     Shoulder Exercises: Seated   Extension Theraband;10 reps   Theraband Level (Shoulder Extension) Level 2 (Red)   Retraction 10 reps;Theraband   Theraband Level (Shoulder Retraction) Level 2 (Red)   Row Theraband;10 reps   Theraband Level (Shoulder Row) Level 2 (Red)   Protraction AAROM;10 reps   Protraction Limitations less compensatory movement this date   Horizontal ABduction AAROM;10 reps   Horizontal ABduction Limitations less compensatory movements this date  External Rotation AAROM;Theraband;10 reps   Theraband Level (Shoulder External Rotation) Level 2 (Red)   Internal Rotation AAROM;Theraband;10 reps   Theraband Level (Shoulder Internal Rotation) Level 2 (Red)   Flexion AAROM;10 reps   Flexion Limitations cued patient to push dowel down towards toes before bringing up into flexion to decrease compensatory movements   Abduction AAROM;10 reps   ABduction Limitations cued patient to bring dowel down towards toes and then up into abduction to decrease compensatory movements     Shoulder Exercises: ROM/Strengthening   "W" Arms 10X   X to V Arms 10X   Rhythmic Stabilization, Supine 25X at 45 and 90 degrees, mod difficulty     Manual Therapy   Manual Therapy Myofascial release    Manual therapy comments Manual therapy completed prior to exercises.   Myofascial Release myofascial release and manual stretching to right upper arm, scapular region and associated regions to decrease pain and restrictions and improve pain free mobility in right shoulder region1/31/15:  added scar release and scar friction massage to decrease fascial and scar restrictions                   OT Short Term Goals - 12/26/16 1107      OT SHORT TERM GOAL #1   Title Pt will be provided and and educated on HEP.    Time 4   Period Weeks   Status On-going     OT SHORT TERM GOAL #2   Title Pt will decrease RUE pain to 4/10 to improve ability to complete ADL tasks using compensatory strategies.    Time 4   Period Weeks   Status Achieved     OT SHORT TERM GOAL #3   Title Pt will decrease RUE fascial restrictions from moderate to minimal amounts to improve mobility required to complete HEP.    Time 4   Period Weeks   Status Achieved     OT SHORT TERM GOAL #4   Title Pt will improve RUE P/ROM to [facility] to improve ability to complete dressing tasks.    Time 4   Period Weeks   Status Achieved     OT SHORT TERM GOAL #5   Status Achieved           OT Long Term Goals - 12/26/16 1108      OT LONG TERM GOAL #1   Title Pt will return to highest level of functioning with ADL tasks using RUE as dominant.    Time 8   Period Weeks   Status On-going     OT LONG TERM GOAL #2   Title Pt will decrease RUE pain to 2/10 or less to improve ability to complete ADL tasks using RUE as dominant.    Time 8   Period Weeks   Status On-going     OT LONG TERM GOAL #3   Title Pt will decrease RUE fascial restrictions from min to trace amounts to improve mobility required for overhead reaching tasks.    Time 8   Period Weeks   Status On-going     OT LONG TERM GOAL #4   Title Pt will improve RUE A/ROM to [facility] to improve ability to reach into overhead cabinets/shelves for items.    Time 8    Period Weeks   Status On-going     OT LONG TERM GOAL #5   Title Pt will improve RUE strength to 4+/5 to improve ability to complete yardwork using RUE as dominant.  Time 8   Period Weeks   Status On-going               Plan - 01/04/17 1235    Clinical Impression Statement A: Pt reports she has not completed exercises since last session due to being sick, experiencing increased tightness today. Added x to v arms, w arms, and theraband IR/er this session, verbal and visual cuing required for correct technique. Pt requires increased time for completion, cuing for proper form.    Plan P: Update HEP for A/ROM in supine, attempt seated A/ROM protraction, continue working towards exercise completion with less compensatory movement from upper trapezius.    OT Home Exercise Plan 12/26: table slides; 1/2: isometric strengthening; 1/24: AA/ROM in supine   Consulted and Agree with Plan of Care Patient      Patient will benefit from skilled therapeutic intervention in order to improve the following deficits and impairments:  Impaired flexibility, Decreased strength, Decreased activity tolerance, Pain, Decreased range of motion, Increased fascial restricitons, Impaired UE functional use  Visit Diagnosis: Acute pain of right shoulder  Stiffness of right shoulder, not elsewhere classified  Other symptoms and signs involving the musculoskeletal system    Problem List Patient Active Problem List   Diagnosis Date Noted  . S/P right rotator cuff repair 10/06/2016  . Complete tear of left rotator cuff    Guadelupe Sabin, OTR/L  [phone] 01/04/2017, 12:39 PM  [facility] [address] [place], [place], [address] Phone: [phone]   Fax:  [phone]  Name: Tammy [name] MRN: [id number] Date of Birth: February 22, 1946

## 2017-01-09 ENCOUNTER — Encounter (HOSPITAL_COMMUNITY): Payer: Self-pay

## 2017-01-09 ENCOUNTER — Ambulatory Visit (HOSPITAL_COMMUNITY): Payer: Medicare Other

## 2017-01-09 DIAGNOSIS — M25511 Pain in right shoulder: Secondary | ICD-10-CM | POA: Diagnosis not present

## 2017-01-09 DIAGNOSIS — R29898 Other symptoms and signs involving the musculoskeletal system: Secondary | ICD-10-CM | POA: Diagnosis not present

## 2017-01-09 DIAGNOSIS — M25611 Stiffness of right shoulder, not elsewhere classified: Secondary | ICD-10-CM

## 2017-01-09 NOTE — Therapy (Signed)
[place], [place], [address] Phone: [phone]   Fax:  [phone]  Occupational Therapy Treatment  Patient Details  Name: Tammy [name] MRN: [id number] Date of Birth: [birth year] Referring Provider: Dr. Arther Abbott  Encounter Date: 01/09/2017      OT End of Session - 01/09/17 1215    Visit Number 13   Number of Visits 24   Date for OT Re-Evaluation 01/21/17   Authorization Type  Medicare a and b   Authorization Time Period before 20th visit   Authorization - Visit Number 13   Authorization - Number of Visits 20   OT Start Time 1117   OT Stop Time 1200   OT Time Calculation (min) 43 min   Activity Tolerance Patient tolerated treatment well   Behavior During Therapy [facility] for tasks assessed/performed      Past Medical History:  Diagnosis Date  . Hypertension     Past Surgical History:  Procedure Laterality Date  . ABDOMINAL HYSTERECTOMY  1988  . COLONOSCOPY N/A 08/11/2016   Procedure: COLONOSCOPY;  Surgeon: Rogene Houston, MD;  [facility];  Service: Endoscopy;  Laterality: N/A;  730  . ELBOW FRACTURE SURGERY    . KNEE ARTHROSCOPY    . SHOULDER OPEN ROTATOR CUFF REPAIR Right 10/06/2016   Procedure: OPEN ROTATOR CUFF REPAIR RIGHT SHOULDER;  Surgeon: Carole Civil, MD;  Location: [facility];  Service: Orthopedics;  Laterality: Right;    There were no vitals filed for this visit.      Subjective Assessment - 01/09/17 1134    Subjective  S: My shoulder doesn't hurt as bad as it did before.   Currently in Pain? Yes   Pain Score 3    Pain Location Shoulder   Pain Orientation Right   Pain Descriptors / Indicators Aching   Pain Type Acute pain            [facility] OT Assessment - 01/09/17 1134      Assessment   Diagnosis Right open rotator cuff repair     Precautions   Precautions Shoulder   Type of Shoulder Precautions P/ROM 12/26-1/23. AA/ROM and progress as tolerated                   OT Treatments/Exercises (OP) - 01/09/17 1139      Exercises   Exercises Shoulder     Shoulder Exercises: Supine   Protraction PROM;5 reps;AROM;10 reps   Horizontal ABduction PROM;5 reps;AROM;10 reps   External Rotation PROM;5 reps;AROM;10 reps   Internal Rotation PROM;5 reps;AROM;10 reps   Flexion PROM;5 reps;AROM;10 reps   ABduction PROM;5 reps;AROM;10 reps     Shoulder Exercises: Seated   Protraction AROM;10 reps     Shoulder Exercises: Standing   Protraction AROM;10 reps   Horizontal ABduction AROM;10 reps   External Rotation AROM;10 reps   Internal Rotation AROM;10 reps   Flexion AROM;10 reps   ABduction AROM;10 reps   Extension Theraband;10 reps   Theraband Level (Shoulder Extension) Level 2 (Red)   Row Theraband;10 reps   Theraband Level (Shoulder Row) Level 2 (Red)   Retraction Theraband;10 reps   Theraband Level (Shoulder Retraction) Level 2 (Red)     Shoulder Exercises: ROM/Strengthening   "W" Arms 10X   X to V Arms 10X     Manual Therapy   Manual Therapy Myofascial release   Manual therapy comments Manual therapy completed prior to exercises.   Myofascial Release myofascial  release and manual stretching to right upper arm, scapular region and associated regions to decrease pain and restrictions and improve pain free mobility in right shoulder region1/31/15:  added scar release and scar friction massage to decrease fascial and scar restrictions                 OT Education - 01/09/17 1148    Education provided Yes   Education Details A/ROm shoulder exercises   Person(s) Educated Patient   Methods Explanation;Demonstration;Verbal cues;Handout   Comprehension Returned demonstration;Verbalized understanding          OT Short Term Goals - 01/09/17 1216      OT SHORT TERM GOAL #1   Title Pt will be provided and and educated on HEP.    Time 4   Period Weeks   Status On-going     OT SHORT TERM GOAL #2   Title Pt will  decrease RUE pain to 4/10 to improve ability to complete ADL tasks using compensatory strategies.    Time 4   Period Weeks     OT SHORT TERM GOAL #3   Title Pt will decrease RUE fascial restrictions from moderate to minimal amounts to improve mobility required to complete HEP.    Time 4   Period Weeks     OT SHORT TERM GOAL #4   Title Pt will improve RUE P/ROM to [facility] to improve ability to complete dressing tasks.    Time 4   Period Weeks           OT Long Term Goals - 12/26/16 1108      OT LONG TERM GOAL #1   Title Pt will return to highest level of functioning with ADL tasks using RUE as dominant.    Time 8   Period Weeks   Status On-going     OT LONG TERM GOAL #2   Title Pt will decrease RUE pain to 2/10 or less to improve ability to complete ADL tasks using RUE as dominant.    Time 8   Period Weeks   Status On-going     OT LONG TERM GOAL #3   Title Pt will decrease RUE fascial restrictions from min to trace amounts to improve mobility required for overhead reaching tasks.    Time 8   Period Weeks   Status On-going     OT LONG TERM GOAL #4   Title Pt will improve RUE A/ROM to [facility] to improve ability to reach into overhead cabinets/shelves for items.    Time 8   Period Weeks   Status On-going     OT LONG TERM GOAL #5   Title Pt will improve RUE strength to 4+/5 to improve ability to complete yardwork using RUE as dominant.    Time 8   Period Weeks   Status On-going               Plan - 01/09/17 1215    Clinical Impression Statement A: Updated HEP to include A/ROM. Patient was able to complete supine and standing A/ROM this session. VC for form and technique as needed.   Plan P: Add scapular theraband exercises to HEP. Add UBE bike.   OT Home Exercise Plan 12/26: table slides; 1/2: isometric strengthening; 1/24: AA/ROM in supine 2/12: updated HEP to include A/ROM exercises. patient may stop previous exercises.       Patient will benefit from skilled  therapeutic intervention in order to improve the following deficits and impairments:  Impaired  flexibility, Decreased strength, Decreased activity tolerance, Pain, Decreased range of motion, Increased fascial restricitons, Impaired UE functional use  Visit Diagnosis: Acute pain of right shoulder  Stiffness of right shoulder, not elsewhere classified  Other symptoms and signs involving the musculoskeletal system    Problem List Patient Active Problem List   Diagnosis Date Noted  . S/P right rotator cuff repair 10/06/2016  . Complete tear of left rotator cuff     Ailene Ravel, OTR/L,CBIS  [phone]  01/09/2017, 12:17 PM  [place] [address] [place], [place], [address] Phone: [phone]   Fax:  [phone]  Name: NANDIKA LANDAVERDE MRN: [id number] Date of Birth: Mar 12, 1946

## 2017-01-09 NOTE — Patient Instructions (Signed)
1) Shoulder Protraction    Begin with elbows by your side, slowly "punch" straight out in front of you.      2) Shoulder Flexion  Supine:     Standing:         Begin with arms at your side with thumbs pointed up, slowly raise both arms up and forward towards overhead.    3) Horizontal abduction/adduction  Supine:   Standing:           Begin with arms straight out in front of you, bring out to the side in at "T" shape. Keep arms straight entire time.    4) Internal & External Rotation    *No band* -Stand with elbows at the side and elbows bent 90 degrees. Move your forearms away from your body, then bring back inward toward the body.     5) Shoulder Abduction  Supine:     Standing:       Lying on your back begin with your arms flat on the table next to your side. Slowly move your arms out to the side so that they go overhead, in a jumping jack or snow angel movement.       Repeat all exercises 10-15 times, 1-2 times per day.  

## 2017-01-11 ENCOUNTER — Ambulatory Visit (HOSPITAL_COMMUNITY): Payer: Medicare Other | Admitting: Occupational Therapy

## 2017-01-11 ENCOUNTER — Encounter (HOSPITAL_COMMUNITY): Payer: Self-pay | Admitting: Occupational Therapy

## 2017-01-11 DIAGNOSIS — R29898 Other symptoms and signs involving the musculoskeletal system: Secondary | ICD-10-CM | POA: Diagnosis not present

## 2017-01-11 DIAGNOSIS — M25611 Stiffness of right shoulder, not elsewhere classified: Secondary | ICD-10-CM | POA: Diagnosis not present

## 2017-01-11 DIAGNOSIS — M25511 Pain in right shoulder: Secondary | ICD-10-CM

## 2017-01-11 NOTE — Patient Instructions (Signed)
Complete 1-2X/day  (Home) Extension: Isometric / Bilateral Arm Retraction - Sitting   Facing anchor, hold hands and elbow at shoulder height, with elbow bent.  Pull arms back to squeeze shoulder blades together. Repeat 10-15 times.  Copyright  VHI. All rights reserved.   (Home) Retraction: Row - Bilateral (Anchor)   Facing anchor, arms reaching forward, pull hands toward stomach, keeping elbows bent and at your sides and pinching shoulder blades together. Repeat 10-15 times.  Copyright  VHI. All rights reserved.   (Clinic) Extension / Flexion (Assist)   Face anchor, pull arms back, keeping elbow straight, and squeze shoulder blades together. Repeat 10-15 times.   Copyright  VHI. All rights reserved.

## 2017-01-11 NOTE — Therapy (Signed)
[place], [place], [address] Phone: [phone]   Fax:  [phone]  Occupational Therapy Treatment  Patient Details  Name: Tammy [name] MRN: [id number] Date of Birth: [birth year] Referring Provider: Dr. Arther Abbott  Encounter Date: 01/11/2017      OT End of Session - 01/11/17 1622    Visit Number 14   Number of Visits 24   Date for OT Re-Evaluation 01/21/17   Authorization Type  Medicare a and b   Authorization Time Period before 20th visit   Authorization - Visit Number 14   Authorization - Number of Visits 20   OT Start Time [phone]   OT Stop Time 1030   OT Time Calculation (min) 46 min   Activity Tolerance Patient tolerated treatment well   Behavior During Therapy [facility] for tasks assessed/performed      Past Medical History:  Diagnosis Date  . Hypertension     Past Surgical History:  Procedure Laterality Date  . ABDOMINAL HYSTERECTOMY  1988  . COLONOSCOPY N/A 08/11/2016   Procedure: COLONOSCOPY;  Surgeon: Rogene Houston, MD;  [facility];  Service: Endoscopy;  Laterality: N/A;  730  . ELBOW FRACTURE SURGERY    . KNEE ARTHROSCOPY    . SHOULDER OPEN ROTATOR CUFF REPAIR Right 10/06/2016   Procedure: OPEN ROTATOR CUFF REPAIR RIGHT SHOULDER;  Surgeon: Carole Civil, MD;  Location: [facility];  Service: Orthopedics;  Laterality: Right;    There were no vitals filed for this visit.      Subjective Assessment - 01/11/17 0943    Subjective  S: I'm feeling a lot better than last week.    Currently in Pain? No/denies            [facility] OT Assessment - 01/11/17 0943      Assessment   Diagnosis Right open rotator cuff repair     Precautions   Precautions Shoulder   Type of Shoulder Precautions P/ROM 12/26-1/23. AA/ROM and progress as tolerated                  OT Treatments/Exercises (OP) - 01/11/17 0946      Exercises   Exercises Shoulder     Shoulder Exercises:  Supine   Protraction PROM;5 reps;AROM;10 reps   Horizontal ABduction PROM;5 reps;AROM;10 reps   External Rotation PROM;5 reps;AROM;10 reps   Internal Rotation PROM;5 reps;AROM;10 reps   Flexion PROM;5 reps;AROM;10 reps   ABduction PROM;5 reps;AROM;10 reps     Shoulder Exercises: Standing   Protraction AROM;10 reps   Horizontal ABduction AROM;10 reps   External Rotation AROM;10 reps   Internal Rotation AROM;10 reps   Flexion AROM;10 reps   ABduction AROM;10 reps   Extension Theraband;10 reps   Theraband Level (Shoulder Extension) Level 2 (Red)   Row Theraband;10 reps   Theraband Level (Shoulder Row) Level 2 (Red)   Retraction Theraband;10 reps   Theraband Level (Shoulder Retraction) Level 2 (Red)     Shoulder Exercises: ROM/Strengthening   UBE (Upper Arm Bike) Level 1 2' forward 2' reverse   "W" Arms 10X   X to V Arms 10X   Proximal Shoulder Strengthening, Supine 10x each no rest breaks     Manual Therapy   Manual Therapy Myofascial release   Manual therapy comments Manual therapy completed prior to exercises.   Myofascial Release myofascial release and manual stretching to right upper arm, scapular region and associated regions to decrease pain and restrictions and  improve pain free mobility in right shoulder region1/31/15:  added scar release and scar friction massage to decrease fascial and scar restrictions                 OT Education - 01/11/17 1622    Education provided Yes   Education Details scapular theraband-red   Person(s) Educated Patient   Methods Explanation;Demonstration;Handout   Comprehension Verbalized understanding;Returned demonstration          OT Short Term Goals - 01/09/17 1216      OT SHORT TERM GOAL #1   Title Pt will be provided and and educated on HEP.    Time 4   Period Weeks   Status On-going     OT SHORT TERM GOAL #2   Title Pt will decrease RUE pain to 4/10 to improve ability to complete ADL tasks using compensatory  strategies.    Time 4   Period Weeks     OT SHORT TERM GOAL #3   Title Pt will decrease RUE fascial restrictions from moderate to minimal amounts to improve mobility required to complete HEP.    Time 4   Period Weeks     OT SHORT TERM GOAL #4   Title Pt will improve RUE P/ROM to [facility] to improve ability to complete dressing tasks.    Time 4   Period Weeks           OT Long Term Goals - 12/26/16 1108      OT LONG TERM GOAL #1   Title Pt will return to highest level of functioning with ADL tasks using RUE as dominant.    Time 8   Period Weeks   Status On-going     OT LONG TERM GOAL #2   Title Pt will decrease RUE pain to 2/10 or less to improve ability to complete ADL tasks using RUE as dominant.    Time 8   Period Weeks   Status On-going     OT LONG TERM GOAL #3   Title Pt will decrease RUE fascial restrictions from min to trace amounts to improve mobility required for overhead reaching tasks.    Time 8   Period Weeks   Status On-going     OT LONG TERM GOAL #4   Title Pt will improve RUE A/ROM to [facility] to improve ability to reach into overhead cabinets/shelves for items.    Time 8   Period Weeks   Status On-going     OT LONG TERM GOAL #5   Title Pt will improve RUE strength to 4+/5 to improve ability to complete yardwork using RUE as dominant.    Time 8   Period Weeks   Status On-going               Plan - 01/11/17 1622    Clinical Impression Statement A: Continued with A/ROM this session, pt improving in form with depressing trapezius, continues to require intermittent cuing. Provided scapular theraband HEP and reviewed exercsies, pt with good form and minimal cuing. Added UBE this session.    Plan P: Follow up on HEP. Continue with A/ROM, add overhead lacing.    OT Home Exercise Plan 12/26: table slides; 1/2: isometric strengthening; 1/24: AA/ROM in supine 2/12: updated HEP to include A/ROM exercises. patient may stop previous exercises. 2/14: red scapular  theraband   Consulted and Agree with Plan of Care Patient      Patient will benefit from skilled therapeutic intervention in order to improve  the following deficits and impairments:  Impaired flexibility, Decreased strength, Decreased activity tolerance, Pain, Decreased range of motion, Increased fascial restricitons, Impaired UE functional use  Visit Diagnosis: Acute pain of right shoulder  Stiffness of right shoulder, not elsewhere classified  Other symptoms and signs involving the musculoskeletal system    Problem List Patient Active Problem List   Diagnosis Date Noted  . S/P right rotator cuff repair 10/06/2016  . Complete tear of left rotator cuff    Guadelupe Sabin, OTR/L  [phone] 01/11/2017, 4:24 PM  [place], [place], [address] Phone: [phone]   Fax:  [phone]  Name: ALVENIA VARELLA MRN: [id number] Date of Birth: 23-Nov-1946

## 2017-01-16 ENCOUNTER — Ambulatory Visit (HOSPITAL_COMMUNITY): Payer: Medicare Other

## 2017-01-16 ENCOUNTER — Encounter (HOSPITAL_COMMUNITY): Payer: Self-pay

## 2017-01-16 DIAGNOSIS — R29898 Other symptoms and signs involving the musculoskeletal system: Secondary | ICD-10-CM | POA: Diagnosis not present

## 2017-01-16 DIAGNOSIS — M25611 Stiffness of right shoulder, not elsewhere classified: Secondary | ICD-10-CM | POA: Diagnosis not present

## 2017-01-16 DIAGNOSIS — M25511 Pain in right shoulder: Secondary | ICD-10-CM | POA: Diagnosis not present

## 2017-01-16 NOTE — Therapy (Signed)
[place], [place], [address] Phone: [phone]   Fax:  [phone]  Occupational Therapy Treatment  Patient Details  Name: Tammy [name] MRN: [id number] Date of Birth: 23-May-1946 Referring Provider: Dr. Arther Abbott  Encounter Date: 01/16/2017      OT End of Session - 01/16/17 1104    Visit Number 15   Number of Visits 24   Date for OT Re-Evaluation 01/21/17   Authorization Type  Medicare a and b   Authorization Time Period before 20th visit   Authorization - Visit Number 15   Authorization - Number of Visits 20   OT Start Time [phone]   OT Stop Time 1030   OT Time Calculation (min) 44 min   Activity Tolerance Patient tolerated treatment well   Behavior During Therapy [facility] for tasks assessed/performed      Past Medical History:  Diagnosis Date  . Hypertension     Past Surgical History:  Procedure Laterality Date  . ABDOMINAL HYSTERECTOMY  1988  . COLONOSCOPY N/A 08/11/2016   Procedure: COLONOSCOPY;  Surgeon: Rogene Houston, MD;  [facility];  Service: Endoscopy;  Laterality: N/A;  730  . ELBOW FRACTURE SURGERY    . KNEE ARTHROSCOPY    . SHOULDER OPEN ROTATOR CUFF REPAIR Right 10/06/2016   Procedure: OPEN ROTATOR CUFF REPAIR RIGHT SHOULDER;  Surgeon: Carole Civil, MD;  Location: [facility];  Service: Orthopedics;  Laterality: Right;    There were no vitals filed for this visit.      Subjective Assessment - 01/16/17 1009    Subjective  S: I only feel a little    Currently in Pain? No/denies            [facility] OT Assessment - 01/16/17 1011      Assessment   Diagnosis Right open rotator cuff repair     Precautions   Precautions Shoulder   Type of Shoulder Precautions Progress as tolerated                  OT Treatments/Exercises (OP) - 01/16/17 1018      Exercises   Exercises Shoulder     Shoulder Exercises: Supine   Protraction PROM;5 reps;AROM;12 reps   Horizontal ABduction PROM;5 reps;AROM;12 reps   External Rotation PROM;5 reps;AROM;12 reps   Internal Rotation PROM;5 reps;AROM;12 reps   Flexion PROM;5 reps;AROM;12 reps   ABduction PROM;5 reps;AROM;12 reps     Shoulder Exercises: Standing   Protraction AROM;12 reps   Horizontal ABduction AROM;12 reps   External Rotation AROM;12 reps   Internal Rotation AROM;12 reps   Flexion AROM;12 reps   ABduction AROM;12 reps     Shoulder Exercises: ROM/Strengthening   UBE (Upper Arm Bike) Level 1 2' forward 2' reverse   Over Head Lace 2'   "W" Arms 10X   X to V Arms 10X   Proximal Shoulder Strengthening, Supine 12x each no rest breaks  max verbal and visual cueing for form and technique                  OT Short Term Goals - 01/09/17 1216      OT SHORT TERM GOAL #1   Title Pt will be provided and and educated on HEP.    Time 4   Period Weeks   Status On-going     OT SHORT TERM GOAL #2   Title Pt will decrease RUE pain to 4/10 to improve ability  to complete ADL tasks using compensatory strategies.    Time 4   Period Weeks     OT SHORT TERM GOAL #3   Title Pt will decrease RUE fascial restrictions from moderate to minimal amounts to improve mobility required to complete HEP.    Time 4   Period Weeks     OT SHORT TERM GOAL #4   Title Pt will improve RUE P/ROM to [facility] to improve ability to complete dressing tasks.    Time 4   Period Weeks           OT Long Term Goals - 12/26/16 1108      OT LONG TERM GOAL #1   Title Pt will return to highest level of functioning with ADL tasks using RUE as dominant.    Time 8   Period Weeks   Status On-going     OT LONG TERM GOAL #2   Title Pt will decrease RUE pain to 2/10 or less to improve ability to complete ADL tasks using RUE as dominant.    Time 8   Period Weeks   Status On-going     OT LONG TERM GOAL #3   Title Pt will decrease RUE fascial restrictions from min to trace amounts to improve mobility required for  overhead reaching tasks.    Time 8   Period Weeks   Status On-going     OT LONG TERM GOAL #4   Title Pt will improve RUE A/ROM to [facility] to improve ability to reach into overhead cabinets/shelves for items.    Time 8   Period Weeks   Status On-going     OT LONG TERM GOAL #5   Title Pt will improve RUE strength to 4+/5 to improve ability to complete yardwork using RUE as dominant.    Time 8   Period Weeks   Status On-going               Plan - 01/16/17 1104    Clinical Impression Statement A: Added overhead lacing to work on functional reaching and shoulder stability. During proximal shoulder strengthening, patient did require max verbal and visual cueing for form and technique.    Plan P: Continue to progress as tolerated. Add prone and sidelying. Strength as able to tolerate.      Patient will benefit from skilled therapeutic intervention in order to improve the following deficits and impairments:  Impaired flexibility, Decreased strength, Decreased activity tolerance, Pain, Decreased range of motion, Increased fascial restricitons, Impaired UE functional use  Visit Diagnosis: Acute pain of right shoulder  Stiffness of right shoulder, not elsewhere classified  Other symptoms and signs involving the musculoskeletal system    Problem List Patient Active Problem List   Diagnosis Date Noted  . S/P right rotator cuff repair 10/06/2016  . Complete tear of left rotator cuff    Ailene Ravel, OTR/L,CBIS  [phone]  01/16/2017, 11:16 AM  [place] [address] [place], [place], [address] Phone: [phone]   Fax:  [phone]  Name: Tammy [name] MRN: [id number] Date of Birth: [birth year]

## 2017-01-18 ENCOUNTER — Ambulatory Visit (HOSPITAL_COMMUNITY): Payer: Medicare Other

## 2017-01-18 ENCOUNTER — Encounter (HOSPITAL_COMMUNITY): Payer: Self-pay

## 2017-01-18 DIAGNOSIS — M25611 Stiffness of right shoulder, not elsewhere classified: Secondary | ICD-10-CM

## 2017-01-18 DIAGNOSIS — R29898 Other symptoms and signs involving the musculoskeletal system: Secondary | ICD-10-CM | POA: Diagnosis not present

## 2017-01-18 DIAGNOSIS — M25511 Pain in right shoulder: Secondary | ICD-10-CM

## 2017-01-18 NOTE — Therapy (Signed)
[place], [place], [address] Phone: [phone]   Fax:  [phone]  Occupational Therapy Treatment  Patient Details  Name: Tammy [name] MRN: [id number] Date of Birth: 07-02-1946 Referring Provider: Dr. Arther Abbott  Encounter Date: 01/18/2017      OT End of Session - 01/18/17 1056    Visit Number 16   Number of Visits 24   Date for OT Re-Evaluation 01/21/17   Authorization Type  Medicare a and b   Authorization Time Period before 20th visit   Authorization - Visit Number 16   Authorization - Number of Visits 20   OT Start Time [id number]   OT Stop Time 1117   OT Time Calculation (min) 37 min   Activity Tolerance Patient tolerated treatment well   Behavior During Therapy [facility] for tasks assessed/performed      Past Medical History:  Diagnosis Date  . Hypertension     Past Surgical History:  Procedure Laterality Date  . ABDOMINAL HYSTERECTOMY  1988  . COLONOSCOPY N/A 08/11/2016   Procedure: COLONOSCOPY;  Surgeon: Rogene Houston, MD;  Location: [facility];  Service: Endoscopy;  Laterality: N/A;  730  . ELBOW FRACTURE SURGERY    . KNEE ARTHROSCOPY    . SHOULDER OPEN ROTATOR CUFF REPAIR Right 10/06/2016   Procedure: OPEN ROTATOR CUFF REPAIR RIGHT SHOULDER;  Surgeon: Carole Civil, MD;  Location: [facility] ORS;  Service: Orthopedics;  Laterality: Right;    There were no vitals filed for this visit.      Subjective Assessment - 01/18/17 1054    Subjective  S: My stomach is a little upset.   Currently in Pain? No/denies            [facility] OT Assessment - 01/18/17 1054      Assessment   Diagnosis Right open rotator cuff repair     Precautions   Precautions Shoulder   Type of Shoulder Precautions Progress as tolerated                  OT Treatments/Exercises (OP) - 01/18/17 1055      Exercises   Exercises Shoulder     Shoulder Exercises: Supine   Protraction PROM;5  reps;Strengthening;10 reps   Protraction Weight (lbs) 1   Horizontal ABduction PROM;5 reps;Strengthening;10 reps   Horizontal ABduction Weight (lbs) 1   External Rotation PROM;5 reps;Strengthening;10 reps   Internal Rotation PROM;5 reps;Strengthening;10 reps   Internal Rotation Weight (lbs) 1   Flexion PROM;5 reps;Strengthening;10 reps   Shoulder Flexion Weight (lbs) 1   ABduction PROM;5 reps;Strengthening;10 reps   Shoulder ABduction Weight (lbs) 1     Shoulder Exercises: Sidelying   External Rotation Strengthening;10 reps   External Rotation Weight (lbs) 1   Internal Rotation Strengthening;10 reps   Internal Rotation Weight (lbs) 1   Flexion Strengthening;10 reps   Flexion Weight (lbs) 1   ABduction Strengthening;10 reps   ABduction Weight (lbs) 1   Other Sidelying Exercises Protraction; 1#; 12X     Manual Therapy   Manual Therapy Myofascial release   Manual therapy comments Manual therapy completed prior to exercises.   Myofascial Release myofascial release and manual stretching to right upper arm, scapular region and associated regions to decrease pain and restrictions and improve pain free mobility in right shoulder region1/31/15:  added scar release and scar friction massage to decrease fascial and scar restrictions  OT Short Term Goals - 01/09/17 1216      OT SHORT TERM GOAL #1   Title Pt will be provided and and educated on HEP.    Time 4   Period Weeks   Status On-going     OT SHORT TERM GOAL #2   Title Pt will decrease RUE pain to 4/10 to improve ability to complete ADL tasks using compensatory strategies.    Time 4   Period Weeks     OT SHORT TERM GOAL #3   Title Pt will decrease RUE fascial restrictions from moderate to minimal amounts to improve mobility required to complete HEP.    Time 4   Period Weeks     OT SHORT TERM GOAL #4   Title Pt will improve RUE P/ROM to [facility] to improve ability to complete dressing tasks.    Time 4    Period Weeks           OT Long Term Goals - 12/26/16 1108      OT LONG TERM GOAL #1   Title Pt will return to highest level of functioning with ADL tasks using RUE as dominant.    Time 8   Period Weeks   Status On-going     OT LONG TERM GOAL #2   Title Pt will decrease RUE pain to 2/10 or less to improve ability to complete ADL tasks using RUE as dominant.    Time 8   Period Weeks   Status On-going     OT LONG TERM GOAL #3   Title Pt will decrease RUE fascial restrictions from min to trace amounts to improve mobility required for overhead reaching tasks.    Time 8   Period Weeks   Status On-going     OT LONG TERM GOAL #4   Title Pt will improve RUE A/ROM to [facility] to improve ability to reach into overhead cabinets/shelves for items.    Time 8   Period Weeks   Status On-going     OT LONG TERM GOAL #5   Title Pt will improve RUE strength to 4+/5 to improve ability to complete yardwork using RUE as dominant.    Time 8   Period Weeks   Status On-going               Plan - 01/18/17 1254    Clinical Impression Statement A: Added weight to supine and sidelying exercises this session to focus on strengthening. patient completed all exercises very very slowly. Unsure if it was before she was unsure of her movement. Verbal cues were given to make the movements for fluid. VC for form and technique.   Plan P: Reassessment      Patient will benefit from skilled therapeutic intervention in order to improve the following deficits and impairments:  Impaired flexibility, Decreased strength, Decreased activity tolerance, Pain, Decreased range of motion, Increased fascial restricitons, Impaired UE functional use  Visit Diagnosis: Acute pain of right shoulder  Stiffness of right shoulder, not elsewhere classified  Other symptoms and signs involving the musculoskeletal system    Problem List Patient Active Problem List   Diagnosis Date Noted  . S/P right rotator cuff  repair 10/06/2016  . Complete tear of left rotator cuff     Tammy [name], OTR/L,CBIS  [phone]  01/18/2017, 12:57 PM  [place] [address] [place], [place], [address] Phone: [phone]   Fax:  [phone]  Name: Tammy [name] MRN:  [id number] Date of Birth: 1946-10-31

## 2017-01-23 ENCOUNTER — Ambulatory Visit (HOSPITAL_COMMUNITY): Payer: Medicare Other

## 2017-01-23 ENCOUNTER — Encounter (HOSPITAL_COMMUNITY): Payer: Self-pay

## 2017-01-23 DIAGNOSIS — M25611 Stiffness of right shoulder, not elsewhere classified: Secondary | ICD-10-CM

## 2017-01-23 DIAGNOSIS — R29898 Other symptoms and signs involving the musculoskeletal system: Secondary | ICD-10-CM | POA: Diagnosis not present

## 2017-01-23 DIAGNOSIS — M25511 Pain in right shoulder: Secondary | ICD-10-CM

## 2017-01-23 NOTE — Therapy (Signed)
[place], [place], [address] Phone: [phone]   Fax:  [phone]  Occupational Therapy Treatment And reassessment Patient Details  Name: Tammy [name] MRN: [id number] Date of Birth: [birth year] Referring Provider: Dr. Arther Abbott  Encounter Date: 01/23/2017      OT End of Session - 01/23/17 1416    Visit Number 17   Number of Visits 24   Authorization Type  Medicare a and b   Authorization Time Period before 20th visit   Authorization - Visit Number 17   Authorization - Number of Visits 20   OT Start Time 0945  reasessment and discharge   OT Stop Time 1030   OT Time Calculation (min) 45 min   Activity Tolerance Patient tolerated treatment well   Behavior During Therapy [facility] for tasks assessed/performed      Past Medical History:  Diagnosis Date  . Hypertension     Past Surgical History:  Procedure Laterality Date  . ABDOMINAL HYSTERECTOMY  1988  . COLONOSCOPY N/A 08/11/2016   Procedure: COLONOSCOPY;  Surgeon: Rogene Houston, MD;  Location[facility];  Service: Endoscopy;  Laterality: N/A;  730  . ELBOW FRACTURE SURGERY    . KNEE ARTHROSCOPY    . SHOULDER OPEN ROTATOR CUFF REPAIR Right 10/06/2016   Procedure: OPEN ROTATOR CUFF REPAIR RIGHT SHOULDER;  Surgeon: Carole Civil, MD;  Location: [facility]S;  Service: Orthopedics;  Laterality: Right;    There were no vitals filed for this visit.      Subjective Assessment - 01/23/17 1415    Subjective  S: I've been using my arm for everthing at home. I haven't do any heavy lifting.    Currently in Pain? No/denies            [facility] OT Assessment - 01/23/17 1000      Assessment   Diagnosis Right open rotator cuff repair     Precautions   Precautions Shoulder   Type of Shoulder Precautions Progress as tolerated     AROM   Overall AROM Comments Assessed supine and standing. IR/er adducted.   Right/Left Shoulder Right   Right  Shoulder Flexion 171 Degrees  previous: 165 (supine) Standing: 155 (previous: 140)   Right Shoulder ABduction 177 Degrees  previous: 165 (supine)Standing: 170 (previous: 110)   Right Shoulder Internal Rotation 90 Degrees  previous: 90 supine and seated   Right Shoulder External Rotation 70 Degrees  previous: 50 (supine) standing: 85 (previous: 75)     PROM   Overall PROM  Within functional limits for tasks performed     Strength   Overall Strength Comments assessed in seated   Strength Assessment Site Shoulder   Right/Left Shoulder Right   Right Shoulder Flexion 4/5  previous: 3+/5   Right Shoulder ABduction 4/5  previous: 3+/5   Right Shoulder Internal Rotation 4+/5  previous: 4-/5   Right Shoulder External Rotation 4+/5  previous: 3+/5                  OT Treatments/Exercises (OP) - 01/23/17 1415      Exercises   Exercises Shoulder     Shoulder Exercises: Supine   Protraction PROM;5 reps   Horizontal ABduction PROM;5 reps   External Rotation PROM;5 reps   Internal Rotation PROM;5 reps   Flexion PROM;5 reps   ABduction PROM;5 reps     Shoulder Exercises: Standing   Horizontal ABduction Theraband;10 reps   Theraband Level (Shoulder  Horizontal ABduction) Level 2 (Red)   Flexion Theraband;10 reps   Theraband Level (Shoulder Flexion) Level 2 (Red)   ABduction Theraband;10 reps   Theraband Level (Shoulder ABduction) Level 2 (Red)   Extension Theraband;10 reps   Theraband Level (Shoulder Extension) Level 2 (Red)     Manual Therapy   Manual Therapy Myofascial release   Manual therapy comments Manual therapy completed prior to exercises.   Myofascial Release myofascial release and manual stretching to right upper arm, scapular region and associated regions to decrease pain and restrictions and improve pain free mobility in right shoulder region1/31/15:  added scar release and scar friction massage to decrease fascial and scar restrictions                  OT Education - 01/23/17 1022    Education provided Yes   Education Details red theraband scapular exercises.   Person(s) Educated Patient   Methods Explanation;Demonstration;Verbal cues;Handout   Comprehension Returned demonstration;Verbalized understanding          OT Short Term Goals - 01/23/17 1010      OT SHORT TERM GOAL #1   Title Pt will be provided and and educated on HEP.    Time 4   Period Weeks   Status Achieved     OT SHORT TERM GOAL #2   Title Pt will decrease RUE pain to 4/10 to improve ability to complete ADL tasks using compensatory strategies.    Time 4   Period Weeks     OT SHORT TERM GOAL #3   Title Pt will decrease RUE fascial restrictions from moderate to minimal amounts to improve mobility required to complete HEP.    Time 4   Period Weeks     OT SHORT TERM GOAL #4   Title Pt will improve RUE P/ROM to [facility] to improve ability to complete dressing tasks.    Time 4   Period Weeks           OT Long Term Goals - 01/23/17 1010      OT LONG TERM GOAL #1   Title Pt will return to highest level of functioning with ADL tasks using RUE as dominant.    Time 8   Period Weeks   Status Achieved     OT LONG TERM GOAL #2   Title Pt will decrease RUE pain to 2/10 or less to improve ability to complete ADL tasks using RUE as dominant.    Time 8   Period Weeks   Status Achieved     OT LONG TERM GOAL #3   Title Pt will decrease RUE fascial restrictions from min to trace amounts to improve mobility required for overhead reaching tasks.    Time 8   Period Weeks   Status Achieved     OT LONG TERM GOAL #4   Title Pt will improve RUE A/ROM to [facility] to improve ability to reach into overhead cabinets/shelves for items.    Time 8   Period Weeks   Status Achieved     OT LONG TERM GOAL #5   Title Pt will improve RUE strength to 4+/5 to improve ability to complete yardwork using RUE as dominant.    Time 8   Period Weeks   Status Partially Met                Plan - 01/23/17 1418    Clinical Impression Statement A: Reassessment completed this date. Patient has met all therapy goals except  her strength goal which she partially met. Patient has increased A/ROM and strength since last reassessment. Patient is completing all daily tasks with her RUE with the excaption of heavy lifting. When weight is applied, patient has difficulty with any lifting above shoulders. Pt is in agreement with discharge recommnedation.   Plan P: D/C from therapy with HEP.      Patient will benefit from skilled therapeutic intervention in order to improve the following deficits and impairments:  Impaired flexibility, Decreased strength, Decreased activity tolerance, Pain, Decreased range of motion, Increased fascial restricitons, Impaired UE functional use  Visit Diagnosis: Acute pain of right shoulder  Stiffness of right shoulder, not elsewhere classified  Other symptoms and signs involving the musculoskeletal system      G-Codes - 2017-02-04 1419    Functional Assessment Tool Used (Outpatient only) FOTO score: 85/100 (15% impaired)   Functional Limitation Carrying, moving and handling objects   Carrying, Moving and Handling Objects Goal Status (Y8502) At least 1 percent but less than 20 percent impaired, limited or restricted   Carrying, Moving and Handling Objects Discharge Status [phone]) At least 1 percent but less than 20 percent impaired, limited or restricted      Problem List Patient Active Problem List   Diagnosis Date Noted  . S/P right rotator cuff repair 10/06/2016  . Complete tear of left rotator cuff    OCCUPATIONAL THERAPY DISCHARGE SUMMARY  Visits from Start of Care: 17  Current functional level related to goals / functional outcomes: See above   Remaining deficits: See above   Education / Equipment: See above Plan: Patient agrees to discharge.  Patient goals were met. Patient is being discharged due to meeting the  stated rehab goals.  ?????        Ailene Ravel, OTR/L,CBIS  [phone]  02/04/17, 2:20 PM  [place], [place], [address] Phone: [phone]   Fax:  [phone]  Name: Tammy [name] MRN: [id number] Date of Birth: October 30, 1946

## 2017-01-23 NOTE — Patient Instructions (Signed)
ELASTIC BAND SHOULDER FLEXION  While holding an elastic band at your side, draw up your arm up in front of you keeping your elbow straight.       ELASTIC BAND SHOULDER ABDUCTION  While holding an elastic band at your side, draw up your arm to the side keeping your elbow straight.    AAROM ELASTIC BAND FLEXION  Pull down on an elastic band and then allow the band to assist your shoulder to raise back up.     Horizontal Abduction. Shoulder  Hold resistive band keeping shoulders down. Slowly pull band laterally engaging scapular musculature until feeling the squeeze between shoulder blade.     Complete the following exercises 10-15 times. 1 time a day.

## 2017-01-25 ENCOUNTER — Ambulatory Visit (HOSPITAL_COMMUNITY): Payer: Medicare Other

## 2017-01-25 DIAGNOSIS — H5203 Hypermetropia, bilateral: Secondary | ICD-10-CM | POA: Diagnosis not present

## 2017-01-25 DIAGNOSIS — H40053 Ocular hypertension, bilateral: Secondary | ICD-10-CM | POA: Diagnosis not present

## 2017-01-25 DIAGNOSIS — H524 Presbyopia: Secondary | ICD-10-CM | POA: Diagnosis not present

## 2017-01-25 DIAGNOSIS — H52223 Regular astigmatism, bilateral: Secondary | ICD-10-CM | POA: Diagnosis not present

## 2017-01-30 ENCOUNTER — Encounter (HOSPITAL_COMMUNITY): Payer: Medicare Other

## 2017-01-31 DIAGNOSIS — D509 Iron deficiency anemia, unspecified: Secondary | ICD-10-CM | POA: Diagnosis not present

## 2017-01-31 DIAGNOSIS — I10 Essential (primary) hypertension: Secondary | ICD-10-CM | POA: Diagnosis not present

## 2017-01-31 DIAGNOSIS — R7301 Impaired fasting glucose: Secondary | ICD-10-CM | POA: Diagnosis not present

## 2017-02-01 ENCOUNTER — Encounter (HOSPITAL_COMMUNITY): Payer: Medicare Other | Admitting: Specialist

## 2017-02-07 ENCOUNTER — Ambulatory Visit: Payer: Medicare Other | Admitting: Orthopedic Surgery

## 2017-02-07 DIAGNOSIS — N182 Chronic kidney disease, stage 2 (mild): Secondary | ICD-10-CM | POA: Diagnosis not present

## 2017-02-07 DIAGNOSIS — R7301 Impaired fasting glucose: Secondary | ICD-10-CM | POA: Diagnosis not present

## 2017-02-07 DIAGNOSIS — Z23 Encounter for immunization: Secondary | ICD-10-CM | POA: Diagnosis not present

## 2017-02-07 DIAGNOSIS — E782 Mixed hyperlipidemia: Secondary | ICD-10-CM | POA: Diagnosis not present

## 2017-02-07 DIAGNOSIS — J302 Other seasonal allergic rhinitis: Secondary | ICD-10-CM | POA: Diagnosis not present

## 2017-02-07 DIAGNOSIS — I10 Essential (primary) hypertension: Secondary | ICD-10-CM | POA: Diagnosis not present

## 2017-02-07 DIAGNOSIS — Z6831 Body mass index (BMI) 31.0-31.9, adult: Secondary | ICD-10-CM | POA: Diagnosis not present

## 2017-02-07 DIAGNOSIS — D509 Iron deficiency anemia, unspecified: Secondary | ICD-10-CM | POA: Diagnosis not present

## 2017-02-08 ENCOUNTER — Ambulatory Visit (INDEPENDENT_AMBULATORY_CARE_PROVIDER_SITE_OTHER): Payer: Medicare Other | Admitting: Orthopedic Surgery

## 2017-02-08 DIAGNOSIS — Z9889 Other specified postprocedural states: Secondary | ICD-10-CM

## 2017-02-08 NOTE — Progress Notes (Signed)
Progress Note   Patient ID: Tammy [name], female   DOB: 01/07/1946, 71 y.o.   MRN: [id number]  Chief Complaint  Patient presents with  . Follow-up    Right RCR DOS 10/06/16    HPI Tammy [name] is a 71 y.o. female.   HPI Status post rotator cuff repair in December. Patient has no complaints and has regained full range of motion and strengthen right shoulder  Review of Systems Review of Systems Normal neuro  Denies fever  Examination There were no vitals taken for this visit.  Gen. appearance the patient's appearance is normal with normal grooming and  hygiene The patient is oriented to person place and time Mood and affect are normal   Ortho Exam   Right shoulder Stability tests are normal  Motor exam 5/5 manual muscle testing , no atrophy  Skin is normal (no rash or erythema)    Medical decision-making Diagnosis, Data, Plan (risk)  No diagnosis found.   She has been released  Arther Abbott, MD 02/08/2017 11:35 AM

## 2017-03-17 ENCOUNTER — Ambulatory Visit (INDEPENDENT_AMBULATORY_CARE_PROVIDER_SITE_OTHER): Payer: Medicare Other | Admitting: Orthopedic Surgery

## 2017-03-17 ENCOUNTER — Encounter: Payer: Self-pay | Admitting: Orthopedic Surgery

## 2017-03-17 ENCOUNTER — Ambulatory Visit (INDEPENDENT_AMBULATORY_CARE_PROVIDER_SITE_OTHER): Payer: Medicare Other

## 2017-03-17 VITALS — BP 130/79 | HR 80 | Ht 64.0 in | Wt 187.0 lb

## 2017-03-17 DIAGNOSIS — M25562 Pain in left knee: Secondary | ICD-10-CM

## 2017-03-17 DIAGNOSIS — T148XXA Other injury of unspecified body region, initial encounter: Secondary | ICD-10-CM | POA: Diagnosis not present

## 2017-03-17 NOTE — Progress Notes (Signed)
Patient ID: Tammy [name], female   DOB: 11-22-1946, 71 y.o.   MRN: [id number]  Chief Complaint  Patient presents with  . Knee Injury    left knee pain s/p car door hit it one month ago    HPI Tammy [name] is a 71 y.o. female.  Well-known to [place] presents with acute pain of her left knee  Approximate 1 month go to patient was putting flowers on her husband's grave and the wind blew the door and it hit the lateral aspect of her left knee. She had acute pain over the lateral aspect of the left knee with some mild swelling and stiffness which has not gotten any better. She did try BenGay and ibuprofen without relief  The knee has been stable without catching locking or giving way  Review of Systems Review of Systems  Constitutional: Negative for fever.  Neurological: Negative for tremors, weakness and numbness.   (2 MINIMUM)  Past Medical History:  Diagnosis Date  . Hypertension     Past Surgical History:  Procedure Laterality Date  . ABDOMINAL HYSTERECTOMY  1988  . COLONOSCOPY N/A 08/11/2016   Procedure: COLONOSCOPY;  Surgeon: Rogene Houston, MD;  [facility];  Service: Endoscopy;  Laterality: N/A;  730  . ELBOW FRACTURE SURGERY    . KNEE ARTHROSCOPY    . SHOULDER OPEN ROTATOR CUFF REPAIR Right 10/06/2016   Procedure: OPEN ROTATOR CUFF REPAIR RIGHT SHOULDER;  Surgeon: Carole Civil, MD;  Location: [facility];  Service: Orthopedics;  Laterality: Right;    Social History Social History  Substance Use Topics  . Smoking status: Former Smoker    Packs/day: 0.75    Years: 15.00    Types: Cigarettes    Quit date: 11/28/1983  . Smokeless tobacco: Never Used  . Alcohol use No    No Known Allergies  Current Meds  Medication Sig  . ibuprofen (ADVIL,MOTRIN) 800 MG tablet Take 1 tablet (800 mg total) by mouth every 8 (eight) hours as needed.  Marland Kitchen levocetirizine (XYZAL) 5 MG tablet Take 5 mg by mouth at bedtime as needed for allergies.   Marland Kitchen losartan (COZAAR) 100 MG  tablet Take 100 mg by mouth daily.      Physical Exam Physical Exam 1.BP 130/79   Pulse 80   Ht 5\' 4"  (1.626 m)   Wt 187 lb (84.8 kg)   BMI 32.10 kg/m   2. Gen. appearance. The patient is well-developed and well-nourished, grooming and hygiene are normal. There are no gross congenital abnormalities  3. The patient is alert and oriented to person place and time  4. Mood and affect are normal  5. Ambulation Walking is normal for her at this point Examination reveals the following: There is no bruising or swelling over the left knee. She has full flexion of the knee. Ligaments are stable in all planes. Quadriceps strength is grade 5 skin is normal pulses are good distally and sensation remains intact  Right knee normal alignment range of motion and muscle tone   MEDICAL DECISION MAKING:    Data Reviewed Plain films show osteoarthritis of the lateral and patellofemoral compartments with no acute fracture  Assessment Encounter Diagnoses  Name Primary?  . Acute pain of left knee Yes  . Contusion of bone      Plan Recommend symptomatic treatment follow-up as needed  Arther Abbott 03/17/2017, 11:33 AM

## 2017-03-17 NOTE — Patient Instructions (Signed)
TYLENOL XS IBUPROFEN MAX FREEZE

## 2017-05-15 DIAGNOSIS — M545 Low back pain: Secondary | ICD-10-CM | POA: Diagnosis not present

## 2017-05-15 DIAGNOSIS — Z6831 Body mass index (BMI) 31.0-31.9, adult: Secondary | ICD-10-CM | POA: Diagnosis not present

## 2017-05-15 DIAGNOSIS — M25551 Pain in right hip: Secondary | ICD-10-CM | POA: Diagnosis not present

## 2017-05-23 DIAGNOSIS — M25552 Pain in left hip: Secondary | ICD-10-CM | POA: Diagnosis not present

## 2017-06-14 ENCOUNTER — Encounter: Payer: Self-pay | Admitting: Orthopedic Surgery

## 2017-06-14 DIAGNOSIS — Z6829 Body mass index (BMI) 29.0-29.9, adult: Secondary | ICD-10-CM | POA: Diagnosis not present

## 2017-06-14 DIAGNOSIS — Z713 Dietary counseling and surveillance: Secondary | ICD-10-CM | POA: Diagnosis not present

## 2017-06-14 DIAGNOSIS — E6609 Other obesity due to excess calories: Secondary | ICD-10-CM | POA: Diagnosis not present

## 2017-06-26 ENCOUNTER — Other Ambulatory Visit: Payer: Self-pay

## 2017-07-28 DIAGNOSIS — H60501 Unspecified acute noninfective otitis externa, right ear: Secondary | ICD-10-CM | POA: Diagnosis not present

## 2017-07-28 DIAGNOSIS — J329 Chronic sinusitis, unspecified: Secondary | ICD-10-CM | POA: Diagnosis not present

## 2017-08-11 DIAGNOSIS — R7301 Impaired fasting glucose: Secondary | ICD-10-CM | POA: Diagnosis not present

## 2017-08-11 DIAGNOSIS — I10 Essential (primary) hypertension: Secondary | ICD-10-CM | POA: Diagnosis not present

## 2017-08-15 DIAGNOSIS — R5383 Other fatigue: Secondary | ICD-10-CM | POA: Diagnosis not present

## 2017-08-15 DIAGNOSIS — Z6828 Body mass index (BMI) 28.0-28.9, adult: Secondary | ICD-10-CM | POA: Diagnosis not present

## 2017-08-15 DIAGNOSIS — J302 Other seasonal allergic rhinitis: Secondary | ICD-10-CM | POA: Diagnosis not present

## 2017-08-15 DIAGNOSIS — Z0000 Encounter for general adult medical examination without abnormal findings: Secondary | ICD-10-CM | POA: Diagnosis not present

## 2017-08-15 DIAGNOSIS — D509 Iron deficiency anemia, unspecified: Secondary | ICD-10-CM | POA: Diagnosis not present

## 2017-08-15 DIAGNOSIS — N182 Chronic kidney disease, stage 2 (mild): Secondary | ICD-10-CM | POA: Diagnosis not present

## 2017-08-15 DIAGNOSIS — R110 Nausea: Secondary | ICD-10-CM | POA: Diagnosis not present

## 2017-08-15 DIAGNOSIS — R42 Dizziness and giddiness: Secondary | ICD-10-CM | POA: Diagnosis not present

## 2017-08-15 DIAGNOSIS — R079 Chest pain, unspecified: Secondary | ICD-10-CM | POA: Diagnosis not present

## 2017-08-15 DIAGNOSIS — I129 Hypertensive chronic kidney disease with stage 1 through stage 4 chronic kidney disease, or unspecified chronic kidney disease: Secondary | ICD-10-CM | POA: Diagnosis not present

## 2017-08-15 DIAGNOSIS — I10 Essential (primary) hypertension: Secondary | ICD-10-CM | POA: Diagnosis not present

## 2017-08-15 DIAGNOSIS — R7301 Impaired fasting glucose: Secondary | ICD-10-CM | POA: Diagnosis not present

## 2017-08-18 DIAGNOSIS — Z6828 Body mass index (BMI) 28.0-28.9, adult: Secondary | ICD-10-CM | POA: Diagnosis not present

## 2017-08-18 DIAGNOSIS — R42 Dizziness and giddiness: Secondary | ICD-10-CM | POA: Diagnosis not present

## 2017-08-18 DIAGNOSIS — R110 Nausea: Secondary | ICD-10-CM | POA: Diagnosis not present

## 2017-08-23 ENCOUNTER — Other Ambulatory Visit (HOSPITAL_COMMUNITY): Payer: Self-pay | Admitting: Internal Medicine

## 2017-08-23 ENCOUNTER — Ambulatory Visit (HOSPITAL_COMMUNITY): Payer: Medicare Other

## 2017-08-23 DIAGNOSIS — Z1231 Encounter for screening mammogram for malignant neoplasm of breast: Secondary | ICD-10-CM

## 2017-09-13 DIAGNOSIS — Z23 Encounter for immunization: Secondary | ICD-10-CM | POA: Diagnosis not present

## 2017-09-21 ENCOUNTER — Ambulatory Visit: Payer: Medicare Other | Admitting: Cardiovascular Disease

## 2017-10-02 ENCOUNTER — Ambulatory Visit (HOSPITAL_COMMUNITY)
Admission: RE | Admit: 2017-10-02 | Discharge: 2017-10-02 | Disposition: A | Discharge status: Home / Self Care | Payer: Medicare Other | Source: Ambulatory Visit | Attending: Internal Medicine | Admitting: Internal Medicine

## 2017-10-02 ENCOUNTER — Encounter (HOSPITAL_COMMUNITY): Payer: Self-pay

## 2017-10-02 DIAGNOSIS — Z1231 Encounter for screening mammogram for malignant neoplasm of breast: Secondary | ICD-10-CM | POA: Insufficient documentation

## 2017-10-05 ENCOUNTER — Encounter: Payer: Self-pay | Admitting: Cardiovascular Disease

## 2017-10-05 ENCOUNTER — Ambulatory Visit (INDEPENDENT_AMBULATORY_CARE_PROVIDER_SITE_OTHER): Payer: Medicare Other | Admitting: Cardiovascular Disease

## 2017-10-05 VITALS — BP 116/72 | HR 91 | Ht 65.0 in | Wt 174.0 lb

## 2017-10-05 DIAGNOSIS — R079 Chest pain, unspecified: Secondary | ICD-10-CM | POA: Diagnosis not present

## 2017-10-05 DIAGNOSIS — R002 Palpitations: Secondary | ICD-10-CM

## 2017-10-05 DIAGNOSIS — R0609 Other forms of dyspnea: Secondary | ICD-10-CM | POA: Diagnosis not present

## 2017-10-05 DIAGNOSIS — R42 Dizziness and giddiness: Secondary | ICD-10-CM

## 2017-10-05 NOTE — Patient Instructions (Signed)
Your physician recommends that you schedule a follow-up appointment in: 6 weeks with [place]    Your physician has requested that you have en exercise stress myoview. For further information please visit [email or web address]. Please follow instruction sheet, as given.    Your physician recommends that you continue on your current medications as directed. Please refer to the Current Medication list given to you today.     If you need a refill on your cardiac medications before your next appointment, please call your pharmacy.      No ab work ordered today.       Thank you for choosing [facility] !

## 2017-10-05 NOTE — Progress Notes (Signed)
CARDIOLOGY CONSULT NOTE  Patient ID: Tammy [name] MRN: [id number] DOB/AGE: [birth year] 73 y.o.  Admit date: (Not on file) Primary Physician: Celene Squibb, MD Referring Physician: Dr. [place] Crane  Reason for Consultation: Hot flashes, dizziness  HPI: Tammy [name] is a 71 y.o. female who is being seen today for the evaluation of hot flashes and dizziness at the request of Celene Squibb, MD.   ECG performed in the office today which I ordered and personally interpreted demonstrates normal sinus rhythm with no ischemic ST segment or T-wave abnormalities, nor any arrhythmias.  Past medical history includes hypertension and hyperlipidemia.  She has some vertigo and orthostatic blood pressures were checked at her PCPs office and was found to be normal.  I reviewed all relevant documentation.  Symptoms began in July.  She has been having episodic hot flashes associated with dizziness and feeling faint accompanied by nausea and vomiting.  She has also had chest pressure.  She has tried Zantac and is gotten some relief.  She has felt more short of breath over the past year.  She quit smoking in 1984.  She describes having a right ear infection which is recently gotten better.  She also has episodic palpitations.   No Known Allergies  Current Outpatient Medications  Medication Sig Dispense Refill  . ibuprofen (ADVIL,MOTRIN) 800 MG tablet Take 1 tablet (800 mg total) by mouth every 8 (eight) hours as needed. 90 tablet 1  . levocetirizine (XYZAL) 5 MG tablet Take 5 mg by mouth at bedtime as needed for allergies.     Marland Kitchen losartan (COZAAR) 100 MG tablet Take 100 mg by mouth daily.     No current facility-administered medications for this visit.     Past Medical History:  Diagnosis Date  . Hypertension     Past Surgical History:  Procedure Laterality Date  . ABDOMINAL HYSTERECTOMY  1988  . ELBOW FRACTURE SURGERY    . KNEE ARTHROSCOPY      Social History   Socioeconomic History  .  Marital status: Widowed    Spouse name: Not on file  . Number of children: Not on file  . Years of education: Not on file  . Highest education level: Not on file  Social Needs  . Financial resource strain: Not on file  . Food insecurity - worry: Not on file  . Food insecurity - inability: Not on file  . Transportation needs - medical: Not on file  . Transportation needs - non-medical: Not on file  Occupational History  . Not on file  Tobacco Use  . Smoking status: Former Smoker    Packs/day: 0.75    Years: 15.00    Pack years: 11.25    Types: Cigarettes    Last attempt to quit: 11/28/1983    Years since quitting: 33.8  . Smokeless tobacco: Never Used  Substance and Sexual Activity  . Alcohol use: No  . Drug use: No  . Sexual activity: Not on file  Other Topics Concern  . Not on file  Social History Narrative  . Not on file     No family history of premature CAD in 1st degree relatives.  Current Meds  Medication Sig  . ibuprofen (ADVIL,MOTRIN) 800 MG tablet Take 1 tablet (800 mg total) by mouth every 8 (eight) hours as needed.  Marland Kitchen levocetirizine (XYZAL) 5 MG tablet Take 5 mg by mouth at bedtime as needed for allergies.   Marland Kitchen losartan (COZAAR) 100  MG tablet Take 100 mg by mouth daily.      Review of systems complete and found to be negative unless listed above in HPI    Physical exam Blood pressure 116/72, pulse 91, height 5\' 5"  (1.651 m), weight 174 lb (78.9 kg), SpO2 98 %. General: NAD Neck: No JVD, no thyromegaly or thyroid nodule.  Lungs: Clear to auscultation bilaterally with normal respiratory effort. CV: Nondisplaced PMI. Regular rate and rhythm, normal S1/S2, no S3/S4, no murmur.  No peripheral edema.  No carotid bruit.    Abdomen: Soft, nontender, no distention.  Skin: Intact without lesions or rashes.  Neurologic: Alert and oriented x 3.  Psych: Normal affect. Extremities: No clubbing or cyanosis.  HEENT: Normal.   ECG: Most recent ECG  reviewed.   Labs: Lab Results  Component Value Date/Time   K 4.0 10/04/2016 09:12 AM   BUN 14 10/04/2016 09:12 AM   CREATININE 0.99 10/04/2016 09:12 AM   HGB 13.3 10/04/2016 09:12 AM     Lipids: No results found for: LDLCALC, LDLDIRECT, CHOL, TRIG, HDL      ASSESSMENT AND PLAN:  1.  Chest pressure and exertional dyspnea with nausea, vomiting, dizziness: Is unclear what her symptomatology specifically represents.  I will evaluate for ischemic heart disease with an exercise Myoview stress test.  Risk factors for cardiac disease include hypertension hyperlipidemia.  2.  Hypertension: Controlled.  No changes.   Disposition: Follow up in 6 weeks.   Signed: Kate Sable, M.D., F.A.C.C.  10/05/2017, 8:49 AM

## 2017-10-12 ENCOUNTER — Encounter (HOSPITAL_COMMUNITY)
Admission: RE | Admit: 2017-10-12 | Discharge: 2017-10-12 | Disposition: A | Discharge status: Home / Self Care | Payer: Medicare Other | Source: Ambulatory Visit | Attending: Cardiovascular Disease | Admitting: Cardiovascular Disease

## 2017-10-12 ENCOUNTER — Encounter (HOSPITAL_COMMUNITY): Payer: Self-pay

## 2017-10-12 ENCOUNTER — Encounter (HOSPITAL_BASED_OUTPATIENT_CLINIC_OR_DEPARTMENT_OTHER)
Admission: RE | Admit: 2017-10-12 | Discharge: 2017-10-12 | Disposition: A | Discharge status: Home / Self Care | Payer: Medicare Other | Source: Ambulatory Visit | Attending: Cardiovascular Disease | Admitting: Cardiovascular Disease

## 2017-10-12 DIAGNOSIS — R079 Chest pain, unspecified: Secondary | ICD-10-CM | POA: Diagnosis not present

## 2017-10-12 LAB — NM MYOCAR MULTI W/SPECT W/WALL MOTION / EF
CHL CUP MPHR: 147 {beats}/min
CHL CUP NUCLEAR SSS: 2
CHL CUP RESTING HR STRESS: 73 {beats}/min
CHL RATE OF PERCEIVED EXERTION: 13
CSEPED: 4 min
CSEPEDS: 2 s
CSEPHR: 96 %
Estimated workload: 7 METS
LHR: 0.36
LV sys vol: 23 mL
LVDIAVOL: 63 mL (ref 46–106)
Peak HR: 142 {beats}/min
SDS: 1
SRS: 1
TID: 1

## 2017-10-12 MED ORDER — REGADENOSON 0.4 MG/5ML IV SOLN
INTRAVENOUS | Status: AC
  Filled 2017-10-12: qty 5

## 2017-10-12 MED ORDER — SODIUM CHLORIDE 0.9% FLUSH
INTRAVENOUS | Status: AC
  Administered 2017-10-12: 10 mL via INTRAVENOUS
  Filled 2017-10-12: qty 10

## 2017-10-12 MED ORDER — TECHNETIUM TC 99M TETROFOSMIN IV KIT
10.0000 | PACK | Freq: Once | INTRAVENOUS | Status: AC | PRN
  Administered 2017-10-12: 10 via INTRAVENOUS

## 2017-10-12 MED ORDER — TECHNETIUM TC 99M TETROFOSMIN IV KIT
30.0000 | PACK | Freq: Once | INTRAVENOUS | Status: AC | PRN
  Administered 2017-10-12: 30 via INTRAVENOUS

## 2017-10-20 DIAGNOSIS — J040 Acute laryngitis: Secondary | ICD-10-CM | POA: Diagnosis not present

## 2017-10-20 DIAGNOSIS — M25551 Pain in right hip: Secondary | ICD-10-CM | POA: Diagnosis not present

## 2017-11-16 DIAGNOSIS — M25551 Pain in right hip: Secondary | ICD-10-CM | POA: Diagnosis not present

## 2017-11-16 DIAGNOSIS — Z6830 Body mass index (BMI) 30.0-30.9, adult: Secondary | ICD-10-CM | POA: Diagnosis not present

## 2017-11-17 ENCOUNTER — Ambulatory Visit: Payer: Medicare Other | Admitting: Cardiovascular Disease

## 2017-11-23 ENCOUNTER — Telehealth: Payer: Self-pay | Admitting: Radiology

## 2017-11-23 NOTE — Telephone Encounter (Signed)
I called patient regarding the use of Diclofenac and Ibuprofen. She states she is only using Diclofenac, has d/c Ibuprofen.

## 2017-12-13 ENCOUNTER — Ambulatory Visit: Payer: Medicare Other | Admitting: Cardiovascular Disease

## 2017-12-14 ENCOUNTER — Encounter: Payer: Self-pay | Admitting: Cardiovascular Disease

## 2018-01-29 ENCOUNTER — Ambulatory Visit (INDEPENDENT_AMBULATORY_CARE_PROVIDER_SITE_OTHER): Payer: Medicare Other | Admitting: Cardiovascular Disease

## 2018-01-29 ENCOUNTER — Encounter: Payer: Self-pay | Admitting: Cardiovascular Disease

## 2018-01-29 VITALS — BP 140/90 | HR 82 | Ht 65.0 in | Wt 181.2 lb

## 2018-01-29 DIAGNOSIS — I10 Essential (primary) hypertension: Secondary | ICD-10-CM | POA: Diagnosis not present

## 2018-01-29 DIAGNOSIS — R0609 Other forms of dyspnea: Secondary | ICD-10-CM | POA: Diagnosis not present

## 2018-01-29 DIAGNOSIS — R079 Chest pain, unspecified: Secondary | ICD-10-CM | POA: Diagnosis not present

## 2018-01-29 NOTE — Progress Notes (Signed)
SUBJECTIVE: The patient returns for follow-up after undergoing cardiovascular testing performed for the evaluation of chest pressure and exertional dyspnea.  She underwent a low risk nuclear stress test on 10/12/17 with no evidence of myocardial ischemia or scar, LVEF 64%.  She has had episodic upper retrosternal pains radiating to her back and underneath her left breast.  She also has pains in both shoulders and mid back.  Most episodes occur at rest.  She has tried ointments which have provided temporary relief.      Review of Systems: As per "subjective", otherwise negative.  No Known Allergies  Current Outpatient Medications  Medication Sig Dispense Refill  . ibuprofen (ADVIL,MOTRIN) 800 MG tablet Take 1 tablet (800 mg total) by mouth every 8 (eight) hours as needed. 90 tablet 1  . levocetirizine (XYZAL) 5 MG tablet Take 5 mg by mouth at bedtime as needed for allergies.     Marland Kitchen losartan-hydrochlorothiazide (HYZAAR) 100-25 MG tablet Take 1 tablet by mouth daily.    . pravastatin (PRAVACHOL) 20 MG tablet Take 1 tablet by mouth daily.     No current facility-administered medications for this visit.     Past Medical History:  Diagnosis Date  . Hypertension     Past Surgical History:  Procedure Laterality Date  . ABDOMINAL HYSTERECTOMY  1988  . COLONOSCOPY N/A 08/11/2016   Procedure: COLONOSCOPY;  Surgeon: Rogene Houston, MD;  Location: [facility];  Service: Endoscopy;  Laterality: N/A;  730  . ELBOW FRACTURE SURGERY    . KNEE ARTHROSCOPY    . SHOULDER OPEN ROTATOR CUFF REPAIR Right 10/06/2016   Procedure: OPEN ROTATOR CUFF REPAIR RIGHT SHOULDER;  Surgeon: Carole Civil, MD;  Location: [facility];  Service: Orthopedics;  Laterality: Right;    Social History   Socioeconomic History  . Marital status: Widowed    Spouse name: Not on file  . Number of children: Not on file  . Years of education: Not on file  . Highest education level: Not on file  Social Needs    . Financial resource strain: Not on file  . Food insecurity - worry: Not on file  . Food insecurity - inability: Not on file  . Transportation needs - medical: Not on file  . Transportation needs - non-medical: Not on file  Occupational History  . Not on file  Tobacco Use  . Smoking status: Former Smoker    Packs/day: 0.75    Years: 15.00    Pack years: 11.25    Types: Cigarettes    Last attempt to quit: 11/28/1983    Years since quitting: 34.1  . Smokeless tobacco: Never Used  Substance and Sexual Activity  . Alcohol use: No  . Drug use: No  . Sexual activity: Not on file  Other Topics Concern  . Not on file  Social History Narrative  . Not on file     Vitals:   01/29/18 1548  BP: 140/90  Pulse: 82  SpO2: 96%  Weight: 181 lb 3.2 oz (82.2 kg)  Height: 5\' 5"  (1.651 m)    Wt Readings from Last 3 Encounters:  01/29/18 181 lb 3.2 oz (82.2 kg)  10/05/17 174 lb (78.9 kg)  03/17/17 187 lb (84.8 kg)     PHYSICAL EXAM General: NAD HEENT: Normal. Neck: No JVD, no thyromegaly. Lungs: Clear to auscultation bilaterally with normal respiratory effort. CV: Regular rate and rhythm, normal S1/S2, no S3/S4, no murmur.  Tenderness to palpation of sternum.  No pretibial or periankle edema.   Abdomen: Soft, nontender, no distention.  Neurologic: Alert and oriented.  Psych: Normal affect. Skin: Normal. Musculoskeletal: No gross deformities.  Tenderness to palpation along paraspinal muscles.    ECG: Most recent ECG reviewed.   Labs: Lab Results  Component Value Date/Time   K 4.0 10/04/2016 09:12 AM   BUN 14 10/04/2016 09:12 AM   CREATININE 0.99 10/04/2016 09:12 AM   HGB 13.3 10/04/2016 09:12 AM     Lipids: No results found for: LDLCALC, LDLDIRECT, CHOL, TRIG, HDL     ASSESSMENT AND PLAN:  1.  Chest pressure and exertional dyspnea with nausea, vomiting, dizziness: Low risk nuclear stress test as detailed above.  Symptoms do not appear to be cardiac in etiology.   There is chest wall and paraspinal muscle tenderness so there appears to be a musculoskeletal component.  No further cardiac testing is indicated at this time.  2.  Hypertension: Blood pressure is mildly elevated.  This will need continued monitoring.  No changes to therapy today.     Disposition: Follow up as needed   Kate Sable, M.D., F.A.C.C.

## 2018-01-29 NOTE — Patient Instructions (Signed)
Medication Instructions:   Your physician recommends that you continue on your current medications as directed. Please refer to the Current Medication list given to you today.  Labwork:  NONE  Testing/Procedures:  NONE  Follow-Up:  Your physician recommends that you schedule a follow-up appointment in: as needed.   Any Other Special Instructions Will Be Listed Below (If Applicable).  If you need a refill on your cardiac medications before your next appointment, please call your pharmacy. 

## 2018-02-12 DIAGNOSIS — H6501 Acute serous otitis media, right ear: Secondary | ICD-10-CM | POA: Diagnosis not present

## 2018-02-12 DIAGNOSIS — Z6830 Body mass index (BMI) 30.0-30.9, adult: Secondary | ICD-10-CM | POA: Diagnosis not present

## 2018-02-20 DIAGNOSIS — D509 Iron deficiency anemia, unspecified: Secondary | ICD-10-CM | POA: Diagnosis not present

## 2018-02-20 DIAGNOSIS — Z6830 Body mass index (BMI) 30.0-30.9, adult: Secondary | ICD-10-CM | POA: Diagnosis not present

## 2018-02-20 DIAGNOSIS — E782 Mixed hyperlipidemia: Secondary | ICD-10-CM | POA: Diagnosis not present

## 2018-02-20 DIAGNOSIS — J040 Acute laryngitis: Secondary | ICD-10-CM | POA: Diagnosis not present

## 2018-02-20 DIAGNOSIS — R7301 Impaired fasting glucose: Secondary | ICD-10-CM | POA: Diagnosis not present

## 2018-02-20 DIAGNOSIS — I10 Essential (primary) hypertension: Secondary | ICD-10-CM | POA: Diagnosis not present

## 2018-02-20 DIAGNOSIS — M25551 Pain in right hip: Secondary | ICD-10-CM | POA: Diagnosis not present

## 2018-02-22 DIAGNOSIS — J302 Other seasonal allergic rhinitis: Secondary | ICD-10-CM | POA: Diagnosis not present

## 2018-02-22 DIAGNOSIS — E782 Mixed hyperlipidemia: Secondary | ICD-10-CM | POA: Diagnosis not present

## 2018-02-22 DIAGNOSIS — Z6830 Body mass index (BMI) 30.0-30.9, adult: Secondary | ICD-10-CM | POA: Diagnosis not present

## 2018-02-22 DIAGNOSIS — J040 Acute laryngitis: Secondary | ICD-10-CM | POA: Diagnosis not present

## 2018-02-22 DIAGNOSIS — R7301 Impaired fasting glucose: Secondary | ICD-10-CM | POA: Diagnosis not present

## 2018-02-22 DIAGNOSIS — I129 Hypertensive chronic kidney disease with stage 1 through stage 4 chronic kidney disease, or unspecified chronic kidney disease: Secondary | ICD-10-CM | POA: Diagnosis not present

## 2018-02-22 DIAGNOSIS — D509 Iron deficiency anemia, unspecified: Secondary | ICD-10-CM | POA: Diagnosis not present

## 2018-03-05 ENCOUNTER — Ambulatory Visit (INDEPENDENT_AMBULATORY_CARE_PROVIDER_SITE_OTHER): Payer: Medicare Other | Admitting: Otolaryngology

## 2018-03-05 DIAGNOSIS — J343 Hypertrophy of nasal turbinates: Secondary | ICD-10-CM | POA: Diagnosis not present

## 2018-03-05 DIAGNOSIS — H9201 Otalgia, right ear: Secondary | ICD-10-CM

## 2018-03-05 DIAGNOSIS — J310 Chronic rhinitis: Secondary | ICD-10-CM | POA: Diagnosis not present

## 2018-03-05 DIAGNOSIS — H93293 Other abnormal auditory perceptions, bilateral: Secondary | ICD-10-CM

## 2018-03-09 DIAGNOSIS — J060 Acute laryngopharyngitis: Secondary | ICD-10-CM | POA: Diagnosis not present

## 2018-03-09 DIAGNOSIS — Z6830 Body mass index (BMI) 30.0-30.9, adult: Secondary | ICD-10-CM | POA: Diagnosis not present

## 2018-03-09 DIAGNOSIS — I129 Hypertensive chronic kidney disease with stage 1 through stage 4 chronic kidney disease, or unspecified chronic kidney disease: Secondary | ICD-10-CM | POA: Diagnosis not present

## 2018-03-09 DIAGNOSIS — Z6829 Body mass index (BMI) 29.0-29.9, adult: Secondary | ICD-10-CM | POA: Diagnosis not present

## 2018-03-09 DIAGNOSIS — H6501 Acute serous otitis media, right ear: Secondary | ICD-10-CM | POA: Diagnosis not present

## 2018-03-09 DIAGNOSIS — J302 Other seasonal allergic rhinitis: Secondary | ICD-10-CM | POA: Diagnosis not present

## 2018-03-09 DIAGNOSIS — J040 Acute laryngitis: Secondary | ICD-10-CM | POA: Diagnosis not present

## 2018-03-09 DIAGNOSIS — M25551 Pain in right hip: Secondary | ICD-10-CM | POA: Diagnosis not present

## 2018-03-09 DIAGNOSIS — D509 Iron deficiency anemia, unspecified: Secondary | ICD-10-CM | POA: Diagnosis not present

## 2018-03-09 DIAGNOSIS — E782 Mixed hyperlipidemia: Secondary | ICD-10-CM | POA: Diagnosis not present

## 2018-03-09 DIAGNOSIS — R7301 Impaired fasting glucose: Secondary | ICD-10-CM | POA: Diagnosis not present

## 2018-03-15 ENCOUNTER — Ambulatory Visit: Payer: Medicare Other | Admitting: Cardiovascular Disease

## 2018-04-16 ENCOUNTER — Ambulatory Visit (INDEPENDENT_AMBULATORY_CARE_PROVIDER_SITE_OTHER): Payer: Medicare Other | Admitting: Otolaryngology

## 2018-04-16 DIAGNOSIS — K219 Gastro-esophageal reflux disease without esophagitis: Secondary | ICD-10-CM | POA: Diagnosis not present

## 2018-04-16 DIAGNOSIS — R490 Dysphonia: Secondary | ICD-10-CM

## 2018-08-13 ENCOUNTER — Ambulatory Visit (INDEPENDENT_AMBULATORY_CARE_PROVIDER_SITE_OTHER): Payer: Medicare Other | Admitting: Otolaryngology

## 2018-08-21 DIAGNOSIS — D509 Iron deficiency anemia, unspecified: Secondary | ICD-10-CM | POA: Diagnosis not present

## 2018-08-21 DIAGNOSIS — I129 Hypertensive chronic kidney disease with stage 1 through stage 4 chronic kidney disease, or unspecified chronic kidney disease: Secondary | ICD-10-CM | POA: Diagnosis not present

## 2018-08-21 DIAGNOSIS — E782 Mixed hyperlipidemia: Secondary | ICD-10-CM | POA: Diagnosis not present

## 2018-08-21 DIAGNOSIS — R7301 Impaired fasting glucose: Secondary | ICD-10-CM | POA: Diagnosis not present

## 2018-08-24 DIAGNOSIS — R7301 Impaired fasting glucose: Secondary | ICD-10-CM | POA: Diagnosis not present

## 2018-08-24 DIAGNOSIS — G4700 Insomnia, unspecified: Secondary | ICD-10-CM | POA: Diagnosis not present

## 2018-08-24 DIAGNOSIS — Z0000 Encounter for general adult medical examination without abnormal findings: Secondary | ICD-10-CM | POA: Diagnosis not present

## 2018-08-24 DIAGNOSIS — J309 Allergic rhinitis, unspecified: Secondary | ICD-10-CM | POA: Diagnosis not present

## 2018-08-24 DIAGNOSIS — Z6829 Body mass index (BMI) 29.0-29.9, adult: Secondary | ICD-10-CM | POA: Diagnosis not present

## 2018-08-24 DIAGNOSIS — N182 Chronic kidney disease, stage 2 (mild): Secondary | ICD-10-CM | POA: Diagnosis not present

## 2018-08-24 DIAGNOSIS — D509 Iron deficiency anemia, unspecified: Secondary | ICD-10-CM | POA: Diagnosis not present

## 2018-08-24 DIAGNOSIS — E782 Mixed hyperlipidemia: Secondary | ICD-10-CM | POA: Diagnosis not present

## 2018-08-24 DIAGNOSIS — I10 Essential (primary) hypertension: Secondary | ICD-10-CM | POA: Diagnosis not present

## 2018-08-31 ENCOUNTER — Other Ambulatory Visit (HOSPITAL_COMMUNITY): Payer: Self-pay | Admitting: Internal Medicine

## 2018-08-31 DIAGNOSIS — Z1231 Encounter for screening mammogram for malignant neoplasm of breast: Secondary | ICD-10-CM

## 2018-09-05 DIAGNOSIS — Z23 Encounter for immunization: Secondary | ICD-10-CM | POA: Diagnosis not present

## 2018-09-06 ENCOUNTER — Ambulatory Visit (INDEPENDENT_AMBULATORY_CARE_PROVIDER_SITE_OTHER): Payer: Medicare Other | Admitting: Otolaryngology

## 2018-09-06 DIAGNOSIS — R490 Dysphonia: Secondary | ICD-10-CM

## 2018-09-06 DIAGNOSIS — K219 Gastro-esophageal reflux disease without esophagitis: Secondary | ICD-10-CM | POA: Diagnosis not present

## 2018-10-02 DIAGNOSIS — M545 Low back pain: Secondary | ICD-10-CM | POA: Diagnosis not present

## 2018-10-04 ENCOUNTER — Ambulatory Visit (HOSPITAL_COMMUNITY): Payer: Medicare Other

## 2018-10-10 ENCOUNTER — Ambulatory Visit (HOSPITAL_COMMUNITY)
Admission: RE | Admit: 2018-10-10 | Discharge: 2018-10-10 | Disposition: A | Discharge status: Home / Self Care | Payer: Medicare Other | Source: Ambulatory Visit | Attending: Internal Medicine | Admitting: Internal Medicine

## 2018-10-10 DIAGNOSIS — Z1231 Encounter for screening mammogram for malignant neoplasm of breast: Secondary | ICD-10-CM | POA: Diagnosis not present

## 2018-11-05 DIAGNOSIS — B351 Tinea unguium: Secondary | ICD-10-CM | POA: Diagnosis not present

## 2018-11-05 DIAGNOSIS — G629 Polyneuropathy, unspecified: Secondary | ICD-10-CM | POA: Diagnosis not present

## 2018-11-05 DIAGNOSIS — L851 Acquired keratosis [keratoderma] palmaris et plantaris: Secondary | ICD-10-CM | POA: Diagnosis not present

## 2018-11-12 DIAGNOSIS — J209 Acute bronchitis, unspecified: Secondary | ICD-10-CM | POA: Diagnosis not present

## 2018-12-04 IMAGING — MG DIGITAL SCREENING BILATERAL MAMMOGRAM WITH TOMO AND CAD
8 series · 8 of 24 positions shown · non-contrast
Comparison: Previous exam(s).

ACR Breast Density Category a: The breast tissue is almost entirely
fatty.

CLINICAL DATA: Screening.

EXAM:
DIGITAL SCREENING BILATERAL MAMMOGRAM WITH TOMO AND CAD

[L MLO synth-2D]
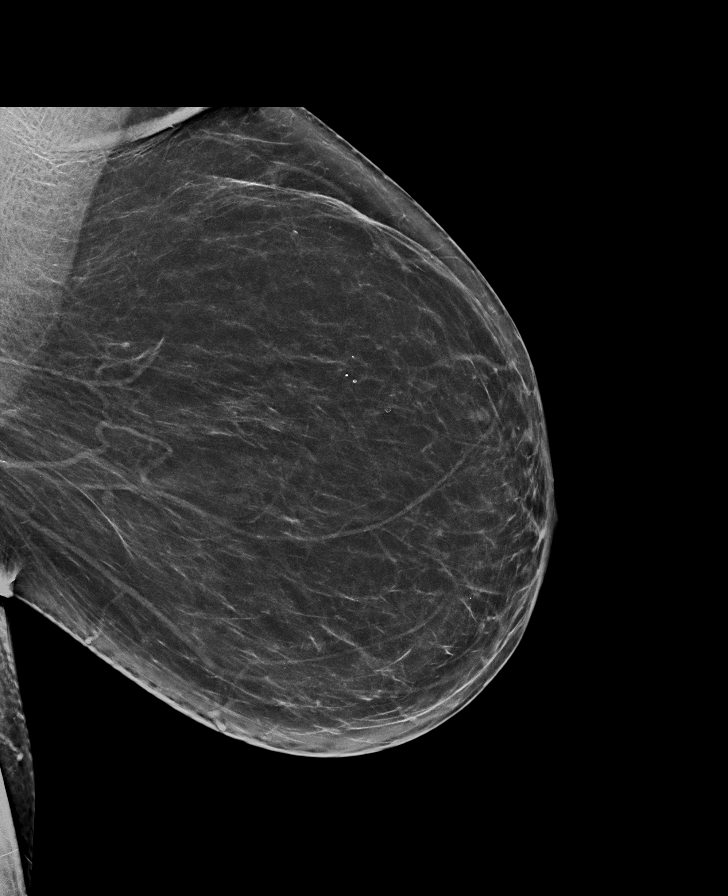

[R CC synth-2D]
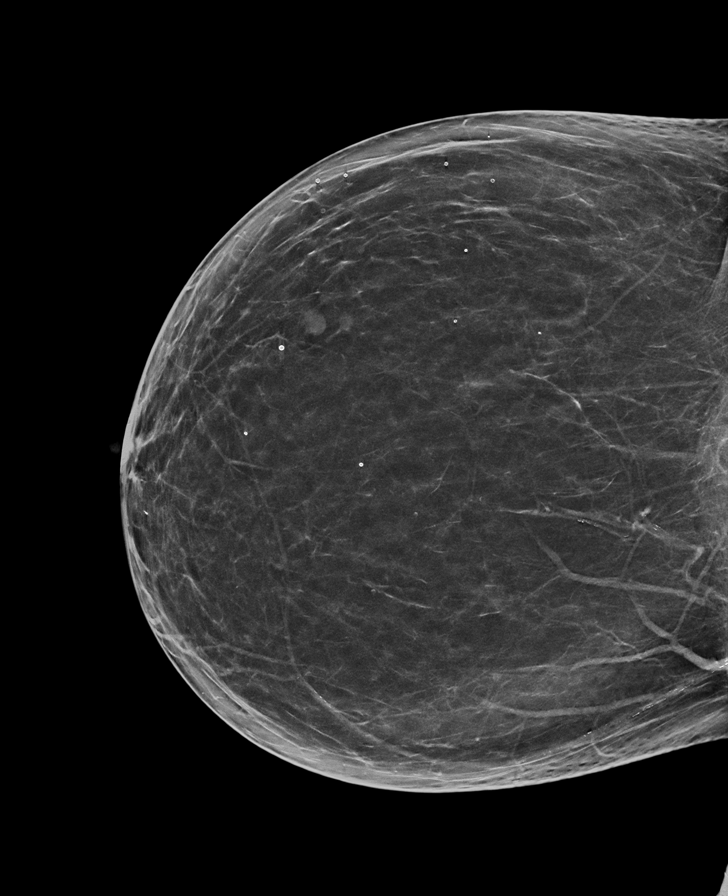

[R MLO synth-2D]
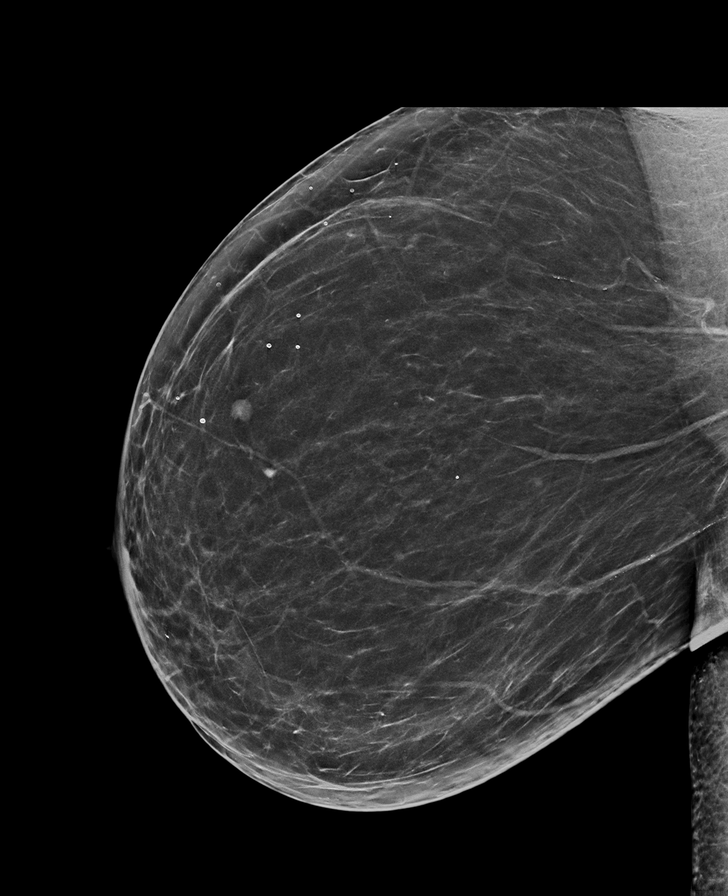

[L CC synth-2D]
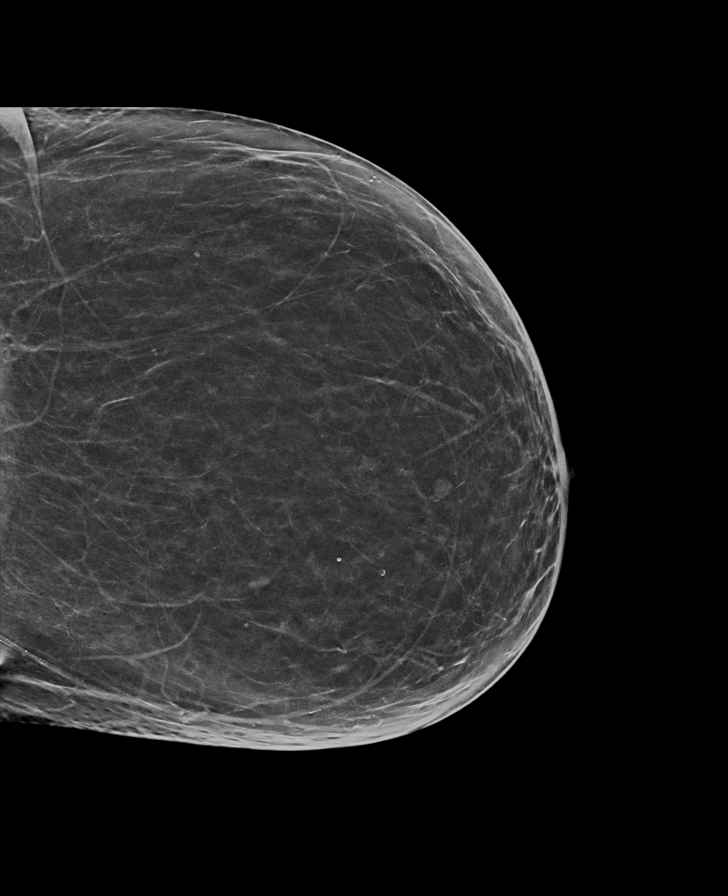

[R CC tomo · tomo slice 37/74.0]
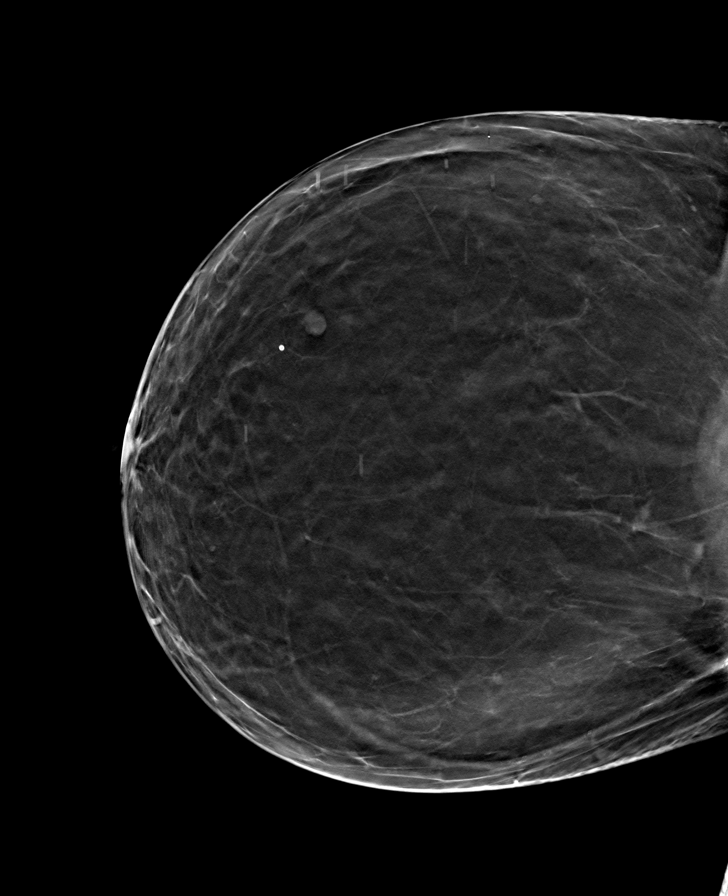

[L MLO tomo · tomo slice 37/73.0]
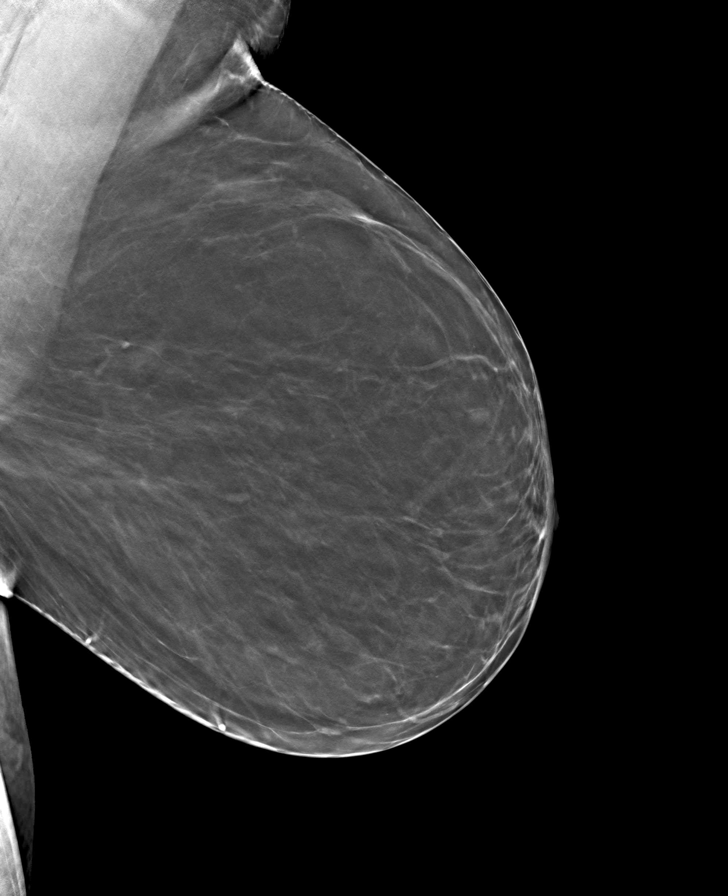

[L CC tomo · tomo slice 33/66.0]
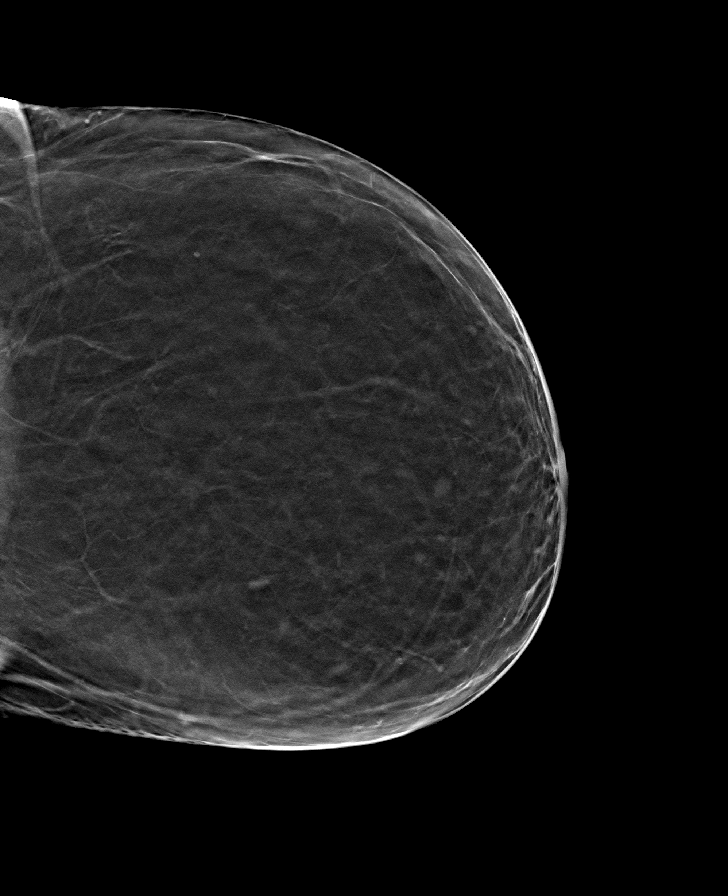

[R MLO tomo · tomo slice 38/75.0]
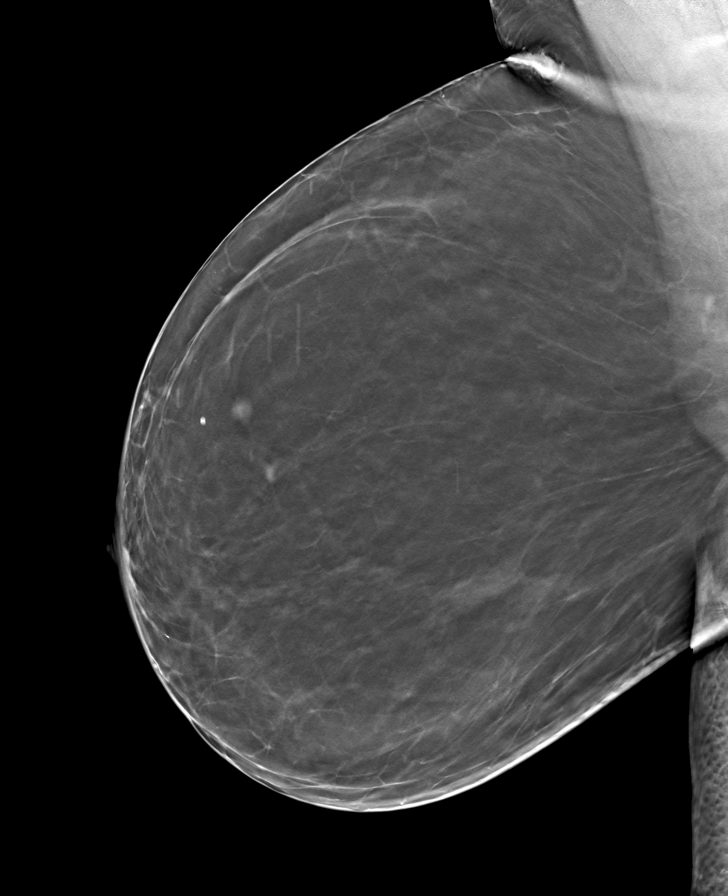

[8 of 24 positions shown; findings below may reference images not displayed]

FINDINGS: There are no findings suspicious for malignancy. Images were
processed with CAD.
IMPRESSION: No mammographic evidence of malignancy. A result letter of this
screening mammogram will be mailed directly to the patient.

RECOMMENDATION:
Screening mammogram in one year. (Code:[id number])

BI-RADS CATEGORY  1: Negative.

## 2018-12-21 DIAGNOSIS — M25512 Pain in left shoulder: Secondary | ICD-10-CM | POA: Diagnosis not present

## 2018-12-21 DIAGNOSIS — M79622 Pain in left upper arm: Secondary | ICD-10-CM | POA: Diagnosis not present

## 2018-12-27 DIAGNOSIS — M79622 Pain in left upper arm: Secondary | ICD-10-CM | POA: Diagnosis not present

## 2018-12-31 DIAGNOSIS — M75112 Incomplete rotator cuff tear or rupture of left shoulder, not specified as traumatic: Secondary | ICD-10-CM | POA: Diagnosis not present

## 2019-02-06 DIAGNOSIS — J302 Other seasonal allergic rhinitis: Secondary | ICD-10-CM | POA: Diagnosis not present

## 2019-02-25 DIAGNOSIS — D509 Iron deficiency anemia, unspecified: Secondary | ICD-10-CM | POA: Diagnosis not present

## 2019-02-25 DIAGNOSIS — G4700 Insomnia, unspecified: Secondary | ICD-10-CM | POA: Diagnosis not present

## 2019-02-25 DIAGNOSIS — E782 Mixed hyperlipidemia: Secondary | ICD-10-CM | POA: Diagnosis not present

## 2019-02-25 DIAGNOSIS — J302 Other seasonal allergic rhinitis: Secondary | ICD-10-CM | POA: Diagnosis not present

## 2019-02-25 DIAGNOSIS — R7301 Impaired fasting glucose: Secondary | ICD-10-CM | POA: Diagnosis not present

## 2019-02-25 DIAGNOSIS — N182 Chronic kidney disease, stage 2 (mild): Secondary | ICD-10-CM | POA: Diagnosis not present

## 2019-02-25 DIAGNOSIS — I10 Essential (primary) hypertension: Secondary | ICD-10-CM | POA: Diagnosis not present

## 2019-05-13 DIAGNOSIS — M545 Low back pain: Secondary | ICD-10-CM | POA: Diagnosis not present

## 2019-05-13 DIAGNOSIS — H6501 Acute serous otitis media, right ear: Secondary | ICD-10-CM | POA: Diagnosis not present

## 2019-05-13 DIAGNOSIS — J060 Acute laryngopharyngitis: Secondary | ICD-10-CM | POA: Diagnosis not present

## 2019-05-13 DIAGNOSIS — Z6829 Body mass index (BMI) 29.0-29.9, adult: Secondary | ICD-10-CM | POA: Diagnosis not present

## 2019-05-13 DIAGNOSIS — E782 Mixed hyperlipidemia: Secondary | ICD-10-CM | POA: Diagnosis not present

## 2019-05-13 DIAGNOSIS — I129 Hypertensive chronic kidney disease with stage 1 through stage 4 chronic kidney disease, or unspecified chronic kidney disease: Secondary | ICD-10-CM | POA: Diagnosis not present

## 2019-05-13 DIAGNOSIS — M25551 Pain in right hip: Secondary | ICD-10-CM | POA: Diagnosis not present

## 2019-05-13 DIAGNOSIS — D509 Iron deficiency anemia, unspecified: Secondary | ICD-10-CM | POA: Diagnosis not present

## 2019-05-13 DIAGNOSIS — J302 Other seasonal allergic rhinitis: Secondary | ICD-10-CM | POA: Diagnosis not present

## 2019-05-13 DIAGNOSIS — Z6830 Body mass index (BMI) 30.0-30.9, adult: Secondary | ICD-10-CM | POA: Diagnosis not present

## 2019-05-13 DIAGNOSIS — J040 Acute laryngitis: Secondary | ICD-10-CM | POA: Diagnosis not present

## 2019-05-13 DIAGNOSIS — R7301 Impaired fasting glucose: Secondary | ICD-10-CM | POA: Diagnosis not present

## 2019-05-21 DIAGNOSIS — R7301 Impaired fasting glucose: Secondary | ICD-10-CM | POA: Diagnosis not present

## 2019-05-21 DIAGNOSIS — K219 Gastro-esophageal reflux disease without esophagitis: Secondary | ICD-10-CM | POA: Diagnosis not present

## 2019-05-21 DIAGNOSIS — N182 Chronic kidney disease, stage 2 (mild): Secondary | ICD-10-CM | POA: Diagnosis not present

## 2019-05-21 DIAGNOSIS — E782 Mixed hyperlipidemia: Secondary | ICD-10-CM | POA: Diagnosis not present

## 2019-05-21 DIAGNOSIS — R7303 Prediabetes: Secondary | ICD-10-CM | POA: Diagnosis not present

## 2019-05-21 DIAGNOSIS — G4700 Insomnia, unspecified: Secondary | ICD-10-CM | POA: Diagnosis not present

## 2019-05-21 DIAGNOSIS — J302 Other seasonal allergic rhinitis: Secondary | ICD-10-CM | POA: Diagnosis not present

## 2019-05-21 DIAGNOSIS — I129 Hypertensive chronic kidney disease with stage 1 through stage 4 chronic kidney disease, or unspecified chronic kidney disease: Secondary | ICD-10-CM | POA: Diagnosis not present

## 2019-05-21 DIAGNOSIS — I10 Essential (primary) hypertension: Secondary | ICD-10-CM | POA: Diagnosis not present

## 2019-05-21 DIAGNOSIS — D509 Iron deficiency anemia, unspecified: Secondary | ICD-10-CM | POA: Diagnosis not present

## 2019-06-14 DIAGNOSIS — R51 Headache: Secondary | ICD-10-CM | POA: Diagnosis not present

## 2019-06-14 DIAGNOSIS — F419 Anxiety disorder, unspecified: Secondary | ICD-10-CM | POA: Diagnosis not present

## 2019-06-14 DIAGNOSIS — R42 Dizziness and giddiness: Secondary | ICD-10-CM | POA: Diagnosis not present

## 2019-06-27 ENCOUNTER — Other Ambulatory Visit: Payer: Self-pay

## 2019-07-04 DIAGNOSIS — H40053 Ocular hypertension, bilateral: Secondary | ICD-10-CM | POA: Diagnosis not present

## 2019-07-08 DIAGNOSIS — M79674 Pain in right toe(s): Secondary | ICD-10-CM | POA: Diagnosis not present

## 2019-07-08 DIAGNOSIS — Q742 Other congenital malformations of lower limb(s), including pelvic girdle: Secondary | ICD-10-CM | POA: Diagnosis not present

## 2019-07-08 DIAGNOSIS — M7671 Peroneal tendinitis, right leg: Secondary | ICD-10-CM | POA: Diagnosis not present

## 2019-07-16 DIAGNOSIS — J309 Allergic rhinitis, unspecified: Secondary | ICD-10-CM | POA: Diagnosis not present

## 2019-07-16 DIAGNOSIS — J358 Other chronic diseases of tonsils and adenoids: Secondary | ICD-10-CM | POA: Diagnosis not present

## 2019-08-22 DIAGNOSIS — H25813 Combined forms of age-related cataract, bilateral: Secondary | ICD-10-CM | POA: Diagnosis not present

## 2019-09-02 DIAGNOSIS — M79674 Pain in right toe(s): Secondary | ICD-10-CM | POA: Diagnosis not present

## 2019-09-02 DIAGNOSIS — M7671 Peroneal tendinitis, right leg: Secondary | ICD-10-CM | POA: Diagnosis not present

## 2019-09-02 DIAGNOSIS — Q742 Other congenital malformations of lower limb(s), including pelvic girdle: Secondary | ICD-10-CM | POA: Diagnosis not present

## 2019-09-04 ENCOUNTER — Other Ambulatory Visit (HOSPITAL_COMMUNITY): Payer: Self-pay | Admitting: Internal Medicine

## 2019-09-04 DIAGNOSIS — Z23 Encounter for immunization: Secondary | ICD-10-CM | POA: Diagnosis not present

## 2019-09-04 DIAGNOSIS — Z1231 Encounter for screening mammogram for malignant neoplasm of breast: Secondary | ICD-10-CM

## 2019-09-04 NOTE — H&P (Addendum)
Surgical History & Physical  Patient Name: Tammy [name] DOB: May 09, 1946  Surgery: Cataract extraction with intraocular lens implant phacoemulsification; Right Eye  Surgeon: Baruch Goldmann MD Surgery Date:  09/27/2019 Pre-Op Date:  09/11/2019  HPI: A 78 Yr. old female patient 1. The patient complains of difficulty when viewing TV, reading closed caption, news scrolls on TV, which began 4 years ago. Both eyes are affected. The episode is gradual. The condition's severity increased since last visit. Symptoms occur when the patient is driving, inside and outside. The complaint is associated with glare. Pt states she does not drive at night anymore. HPI was performed by Baruch Goldmann .  Medical History: High Blood Pressure LDL  Review of Systems Allergic/Immunologic Seasonal Allergies All recorded systems are negative except as noted above.  Social   Former smoker    Medication Rosuvastatin, Levocetirizine, Losartan Potassium,   Sx/Procedures Rotator cuff repair, Hysterectomy,   Drug Allergies   NKDA   History & Physical: Heent:  Cataract, Right eye NECK: supple without bruits LUNGS: lungs clear to auscultation CV: regular rate and rhythm Abdomen: soft and non-tender Impression & Plan: Assessment: 1.  COMBINED FORMS AGE RELATED CATARACT; Both Eyes (H25.813) 2.  ASTIGMATISM, REGULAR; Both Eyes (H52.223)  Plan: 1.  Cataract accounts for the patient's decreased vision. This visual impairment is not correctable with a tolerable change in glasses or contact lenses. Cataract surgery with an implantation of a new lens should significantly improve the visual and functional status of the patient. Discussed all risks, benefits, alternatives, and potential complications. Discussed the procedures and recovery. Patient desires to have surgery. A-scan ordered and performed today for intra-ocular lens calculations. The surgery will be performed in order to improve vision for driving, reading,  and for eye examinations. Recommend phacoemulsification with intra-ocular lens. Right Eye worst - first. Dilates well - shugarcaine by protocol. 2.  discussed toric - defers.

## 2019-09-09 NOTE — Patient Instructions (Signed)
Your procedure is scheduled on:   09/13/2019              Report to [facility] at  7:45   AM.  Call this number if you have problems the morning of surgery: [phone]   Do not eat or drink :After Midnight.    Take these medicines the morning of surgery with A SIP OF WATER:      Xyzal if needed      Do not wear jewelry, make-up or nail polish.  Do not wear lotions, powders, or perfumes. You may wear deodorant.  Do not bring valuables to the hospital.  Contacts, dentures or bridgework may not be worn into surgery.  Patients discharged the day of surgery will not be allowed to drive home.  Name and phone number of your driver.                                                                                                                                       Cataract Surgery  A cataract is a clouding of the lens of the eye. When a lens becomes cloudy, vision is reduced based on the degree and nature of the clouding. Surgery may be needed to improve vision. Surgery removes the cloudy lens and usually replaces it with a substitute lens (intraocular lens, IOL). LET YOUR EYE DOCTOR KNOW ABOUT:  Allergies to food or medicine.   Medicines taken including herbs, eyedrops, over-the-counter medicines, and creams.   Use of steroids (by mouth or creams).   Previous problems with anesthetics or numbing medicine.   History of bleeding problems or blood clots.   Previous surgery.   Other health problems, including diabetes and kidney problems.   Possibility of pregnancy, if this applies.  RISKS AND COMPLICATIONS  Infection.   Inflammation of the eyeball (endophthalmitis) that can spread to both eyes (sympathetic ophthalmia).   Poor wound healing.   If an IOL is inserted, it can later fall out of proper position. This is very uncommon.   Clouding of the part of your eye that holds an IOL in place. This is called an "after-cataract." These are uncommon, but easily treated.  BEFORE THE  PROCEDURE  Do not eat or drink anything except small amounts of water for 8 to 12 before your surgery, or as directed by your caregiver.    Unless you are told otherwise, continue any eyedrops you have been prescribed.   Talk to your primary caregiver about all other medicines that you take (both prescription and non-prescription). In some cases, you may need to stop or change medicines near the time of your surgery. This is most important if you are taking blood-thinning medicine. Do not stop medicines unless you are told to do so.   Arrange for someone to drive you to and from the procedure.   Do not put contact lenses in either eye on the  day of your surgery.  PROCEDURE There is more than one method for safely removing a cataract. Your doctor can explain the differences and help determine which is best for you. Phacoemulsification surgery is the most common form of cataract surgery.  An injection is given behind the eye or eyedrops are given to make this a painless procedure.   A small cut (incision) is made on the edge of the clear, dome-shaped surface that covers the front of the eye (cornea).   A tiny probe is painlessly inserted into the eye. This device gives off ultrasound waves that soften and break up the cloudy center of the lens. This makes it easier for the cloudy lens to be removed by suction.   An IOL may be implanted.   The normal lens of the eye is covered by a clear capsule. Part of that capsule is intentionally left in the eye to support the IOL.   Your surgeon may or may not use stitches to close the incision.  There are other forms of cataract surgery that require a larger incision and stiches to close the eye. This approach is taken in cases where the doctor feels that the cataract cannot be easily removed using phacoemulsification. AFTER THE PROCEDURE  When an IOL is implanted, it does not need care. It becomes a permanent part of your eye and cannot be seen or  felt.   Your doctor will schedule follow-up exams to check on your progress.   Review your other medicines with your doctor to see which can be resumed after surgery.   Use eyedrops or take medicine as prescribed by your doctor.  Document Released: 11/03/2011 Document Reviewed: 10/31/2011 [facility] Patient Information 2012 [place].  .Cataract Surgery Care After Refer to this sheet in the next few weeks. These instructions provide you with information on caring for yourself after your procedure. Your caregiver may also give you more specific instructions. Your treatment has been planned according to current medical practices, but problems sometimes occur. Call your caregiver if you have any problems or questions after your procedure.  HOME CARE INSTRUCTIONS   Avoid strenuous activities as directed by your caregiver.   Ask your caregiver when you can resume driving.   Use eyedrops or other medicines to help healing and control pressure inside your eye as directed by your caregiver.   Only take over-the-counter or prescription medicines for pain, discomfort, or fever as directed by your caregiver.   Do not to touch or rub your eyes.   You may be instructed to use a protective shield during the first few days and nights after surgery. If not, wear sunglasses to protect your eyes. This is to protect the eye from pressure or from being accidentally bumped.   Keep the area around your eye clean and dry. Avoid swimming or allowing water to hit you directly in the face while showering. Keep soap and shampoo out of your eyes.   Do not bend or lift heavy objects. Bending increases pressure in the eye. You can walk, climb stairs, and do light household chores.   Do not put a contact lens into the eye that had surgery until your caregiver says it is okay to do so.   Ask your doctor when you can return to work. This will depend on the kind of work that you do. If you work in a dusty  environment, you may be advised to wear protective eyewear for a period of time.   Ask  your caregiver when it will be safe to engage in sexual activity.   Continue with your regular eye exams as directed by your caregiver.  What to expect:  It is normal to feel itching and mild discomfort for a few days after cataract surgery. Some fluid discharge is also common, and your eye may be sensitive to light and touch.   After 1 to 2 days, even moderate discomfort should disappear. In most cases, healing will take about 6 weeks.   If you received an intraocular lens (IOL), you may notice that colors are very bright or have a blue tinge. Also, if you have been in bright sunlight, everything may appear reddish for a few hours. If you see these color tinges, it is because your lens is clear and no longer cloudy. Within a few months after receiving an IOL, these extra colors should go away. When you have healed, you will probably need new glasses.  SEEK MEDICAL CARE IF:   You have increased bruising around your eye.   You have discomfort not helped by medicine.  SEEK IMMEDIATE MEDICAL CARE IF:   You have a  fever.   You have a worsening or sudden vision loss.   You have redness, swelling, or increasing pain in the eye.   You have a thick discharge from the eye that had surgery.  MAKE SURE YOU:  Understand these instructions.   Will watch your condition.   Will get help right away if you are not doing well or get worse.  Document Released: 06/03/2005 Document Revised: 11/03/2011 Document Reviewed: 07/08/2011 [facility] Patient Information 2012 [place].    Monitored Anesthesia Care  Monitored anesthesia care is an anesthesia service for a medical procedure. Anesthesia is the loss of the ability to feel pain. It is produced by medications called anesthetics. It may affect a small area of your body (local anesthesia), a large area of your body (regional anesthesia), or your entire body  (general anesthesia). The need for monitored anesthesia care depends your procedure, your condition, and the potential need for regional or general anesthesia. It is often provided during procedures where:   General anesthesia may be needed if there are complications. This is because you need special care when you are under general anesthesia.    You will be under local or regional anesthesia. This is so that you are able to have higher levels of anesthesia if needed.    You will receive calming medications (sedatives). This is especially the case if sedatives are given to put you in a semi-conscious state of relaxation (deep sedation). This is because the amount of sedative needed to produce this state can be hard to predict. Too much of a sedative can produce general anesthesia. Monitored anesthesia care is performed by one or more caregivers who have special training in all types of anesthesia. You will need to meet with these caregivers before your procedure. During this meeting, they will ask you about your medical history. They will also give you instructions to follow. (For example, you will need to stop eating and drinking before your procedure. You may also need to stop or change medications you are taking.) During your procedure, your caregivers will stay with you. They will:   Watch your condition. This includes watching you blood pressure, breathing, and level of pain.    Diagnose and treat problems that occur.    Give medications if they are needed. These may include calming medications (sedatives) and anesthetics.  Make sure you are comfortable.   Having monitored anesthesia care does not necessarily mean that you will be under anesthesia. It does mean that your caregivers will be able to manage anesthesia if you need it or if it occurs. It also means that you will be able to have a different type of anesthesia than you are having if you need it. When your procedure is complete, your  caregivers will continue to watch your condition. They will make sure any medications wear off before you are allowed to go home.  Document Released: 08/10/2005 Document Revised: 03/11/2013 Document Reviewed: 12/26/2012 [facility] Patient Information 2014 [place], [place].

## 2019-09-11 ENCOUNTER — Encounter (HOSPITAL_COMMUNITY)
Admission: RE | Admit: 2019-09-11 | Discharge: 2019-09-11 | Disposition: A | Discharge status: Home / Self Care | Payer: Medicare Other | Source: Ambulatory Visit | Attending: Ophthalmology | Admitting: Ophthalmology

## 2019-09-11 ENCOUNTER — Encounter (HOSPITAL_COMMUNITY): Payer: Self-pay

## 2019-09-11 ENCOUNTER — Other Ambulatory Visit (HOSPITAL_COMMUNITY)
Admission: RE | Admit: 2019-09-11 | Discharge: 2019-09-11 | Disposition: A | Discharge status: Home / Self Care | Payer: Medicare Other | Source: Ambulatory Visit | Attending: Ophthalmology | Admitting: Ophthalmology

## 2019-09-11 ENCOUNTER — Other Ambulatory Visit: Payer: Self-pay

## 2019-09-11 DIAGNOSIS — Z01812 Encounter for preprocedural laboratory examination: Secondary | ICD-10-CM | POA: Diagnosis not present

## 2019-09-11 DIAGNOSIS — Z20828 Contact with and (suspected) exposure to other viral communicable diseases: Secondary | ICD-10-CM | POA: Insufficient documentation

## 2019-09-11 LAB — CBC WITH DIFFERENTIAL/PLATELET
Abs Immature Granulocytes: 0.01 10*3/uL (ref 0.00–0.07)
Basophils Absolute: 0.1 10*3/uL (ref 0.0–0.1)
Basophils Relative: 1 %
Eosinophils Absolute: 0.3 10*3/uL (ref 0.0–0.5)
Eosinophils Relative: 3 %
HCT: 41.6 % (ref 36.0–46.0)
Hemoglobin: 13 g/dL (ref 12.0–15.0)
Immature Granulocytes: 0 %
Lymphocytes Relative: 48 %
Lymphs Abs: 4.3 10*3/uL — ABNORMAL HIGH (ref 0.7–4.0)
MCH: 25.1 pg — ABNORMAL LOW (ref 26.0–34.0)
MCHC: 31.3 g/dL (ref 30.0–36.0)
MCV: 80.5 fL (ref 80.0–100.0)
Monocytes Absolute: 0.4 10*3/uL (ref 0.1–1.0)
Monocytes Relative: 5 %
Neutro Abs: 3.8 10*3/uL (ref 1.7–7.7)
Neutrophils Relative %: 43 %
Platelets: 316 10*3/uL (ref 150–400)
RBC: 5.17 MIL/uL — ABNORMAL HIGH (ref 3.87–5.11)
RDW: 14.6 % (ref 11.5–15.5)
WBC: 8.9 10*3/uL (ref 4.0–10.5)
nRBC: 0 % (ref 0.0–0.2)

## 2019-09-11 LAB — BASIC METABOLIC PANEL
Anion gap: 10 (ref 5–15)
BUN: 14 mg/dL (ref 8–23)
CO2: 24 mmol/L (ref 22–32)
Calcium: 9.4 mg/dL (ref 8.9–10.3)
Chloride: 105 mmol/L (ref 98–111)
Creatinine, Ser: 0.84 mg/dL (ref 0.44–1.00)
GFR calc Af Amer: >60 mL/min (ref >60)
GFR calc non Af Amer: >60 mL/min (ref >60)
Glucose, Bld: 113 mg/dL — ABNORMAL HIGH (ref 70–99)
Potassium: 3.6 mmol/L (ref 3.5–5.1)
Sodium: 139 mmol/L (ref 135–145)

## 2019-09-11 LAB — SARS CORONAVIRUS 2 (TAT 6-24 HRS): SARS Coronavirus 2: NEGATIVE

## 2019-09-19 DIAGNOSIS — H25811 Combined forms of age-related cataract, right eye: Secondary | ICD-10-CM | POA: Diagnosis not present

## 2019-09-24 ENCOUNTER — Other Ambulatory Visit: Payer: Self-pay

## 2019-09-24 ENCOUNTER — Encounter (HOSPITAL_COMMUNITY)
Admission: RE | Admit: 2019-09-24 | Discharge: 2019-09-24 | Disposition: A | Discharge status: Home / Self Care | Payer: Medicare Other | Source: Ambulatory Visit | Attending: Ophthalmology | Admitting: Ophthalmology

## 2019-09-25 ENCOUNTER — Other Ambulatory Visit (HOSPITAL_COMMUNITY)
Admission: RE | Admit: 2019-09-25 | Discharge: 2019-09-25 | Disposition: A | Discharge status: Home / Self Care | Payer: Medicare Other | Source: Ambulatory Visit | Attending: Ophthalmology | Admitting: Ophthalmology

## 2019-09-25 ENCOUNTER — Other Ambulatory Visit: Payer: Self-pay

## 2019-09-25 DIAGNOSIS — Z20828 Contact with and (suspected) exposure to other viral communicable diseases: Secondary | ICD-10-CM | POA: Insufficient documentation

## 2019-09-25 DIAGNOSIS — Z01812 Encounter for preprocedural laboratory examination: Secondary | ICD-10-CM | POA: Diagnosis not present

## 2019-09-25 LAB — SARS CORONAVIRUS 2 (TAT 6-24 HRS): SARS Coronavirus 2: NEGATIVE

## 2019-09-27 ENCOUNTER — Ambulatory Visit (HOSPITAL_COMMUNITY)
Admission: RE | Admit: 2019-09-27 | Discharge: 2019-09-27 | Disposition: A | Discharge status: Home / Self Care | Payer: Medicare Other | Attending: Ophthalmology | Admitting: Ophthalmology

## 2019-09-27 ENCOUNTER — Encounter (HOSPITAL_COMMUNITY): Payer: Self-pay

## 2019-09-27 ENCOUNTER — Encounter (HOSPITAL_COMMUNITY)
Admission: RE | Disposition: A | Discharge status: Home / Self Care | Payer: Self-pay | Source: Home / Self Care | Attending: Ophthalmology

## 2019-09-27 ENCOUNTER — Ambulatory Visit (HOSPITAL_COMMUNITY): Payer: Medicare Other | Admitting: Anesthesiology

## 2019-09-27 ENCOUNTER — Other Ambulatory Visit: Payer: Self-pay

## 2019-09-27 DIAGNOSIS — Z87891 Personal history of nicotine dependence: Secondary | ICD-10-CM | POA: Diagnosis not present

## 2019-09-27 DIAGNOSIS — I10 Essential (primary) hypertension: Secondary | ICD-10-CM | POA: Diagnosis not present

## 2019-09-27 DIAGNOSIS — H25811 Combined forms of age-related cataract, right eye: Secondary | ICD-10-CM | POA: Diagnosis not present

## 2019-09-27 DIAGNOSIS — H52203 Unspecified astigmatism, bilateral: Secondary | ICD-10-CM | POA: Insufficient documentation

## 2019-09-27 DIAGNOSIS — Z79899 Other long term (current) drug therapy: Secondary | ICD-10-CM | POA: Insufficient documentation

## 2019-09-27 HISTORY — PX: CATARACT EXTRACTION W/PHACO: SHX586

## 2019-09-27 SURGERY — PHACOEMULSIFICATION, CATARACT, WITH IOL INSERTION
Anesthesia: Monitor Anesthesia Care | Site: Eye | Laterality: Right

## 2019-09-27 MED ORDER — MIDAZOLAM HCL 2 MG/2ML IJ SOLN
INTRAMUSCULAR | Status: DC | PRN
  Administered 2019-09-27: 1 mg via INTRAVENOUS

## 2019-09-27 MED ORDER — SODIUM HYALURONATE 23 MG/ML IO SOLN
INTRAOCULAR | Status: DC | PRN
  Administered 2019-09-27: 0.6 mL via INTRAOCULAR

## 2019-09-27 MED ORDER — LIDOCAINE HCL 3.5 % OP GEL
1.0000 "application " | Freq: Once | OPHTHALMIC | Status: AC
  Administered 2019-09-27: 1 via OPHTHALMIC

## 2019-09-27 MED ORDER — PHENYLEPHRINE HCL 2.5 % OP SOLN
1.0000 [drp] | OPHTHALMIC | Status: AC | PRN
  Administered 2019-09-27 (×3): 1 [drp] via OPHTHALMIC

## 2019-09-27 MED ORDER — BSS IO SOLN
INTRAOCULAR | Status: DC | PRN
  Administered 2019-09-27: 15 mL via INTRAOCULAR

## 2019-09-27 MED ORDER — MIDAZOLAM HCL 2 MG/2ML IJ SOLN
INTRAMUSCULAR | Status: AC
  Filled 2019-09-27: qty 2

## 2019-09-27 MED ORDER — TETRACAINE HCL 0.5 % OP SOLN
1.0000 [drp] | OPHTHALMIC | Status: AC | PRN
  Administered 2019-09-27 (×3): 1 [drp] via OPHTHALMIC

## 2019-09-27 MED ORDER — CYCLOPENTOLATE-PHENYLEPHRINE 0.2-1 % OP SOLN
1.0000 [drp] | OPHTHALMIC | Status: AC | PRN
  Administered 2019-09-27 (×3): 1 [drp] via OPHTHALMIC

## 2019-09-27 MED ORDER — NEOMYCIN-POLYMYXIN-DEXAMETH 3.5-10000-0.1 OP SUSP
OPHTHALMIC | Status: DC | PRN
  Administered 2019-09-27: 1 [drp] via OPHTHALMIC

## 2019-09-27 MED ORDER — PROVISC 10 MG/ML IO SOLN
INTRAOCULAR | Status: DC | PRN
  Administered 2019-09-27: 0.85 mL via INTRAOCULAR

## 2019-09-27 MED ORDER — POVIDONE-IODINE 5 % OP SOLN
OPHTHALMIC | Status: DC | PRN
  Administered 2019-09-27: 1 via OPHTHALMIC

## 2019-09-27 MED ORDER — LIDOCAINE HCL (PF) 1 % IJ SOLN
INTRAOCULAR | Status: DC | PRN
  Administered 2019-09-27: 1 mL via OPHTHALMIC

## 2019-09-27 MED ORDER — EPINEPHRINE PF 1 MG/ML IJ SOLN
INTRAOCULAR | Status: DC | PRN
  Administered 2019-09-27: 500 mL

## 2019-09-27 MED ORDER — SODIUM CHLORIDE 0.9% FLUSH
10.0000 mL | INTRAVENOUS | Status: DC | PRN
  Administered 2019-09-27: 3 mL via INTRAVENOUS

## 2019-09-27 SURGICAL SUPPLY — 13 items

## 2019-09-27 NOTE — Anesthesia Preprocedure Evaluation (Signed)
Anesthesia Evaluation  Patient identified by MRN, date of birth, ID band Patient awake    Reviewed: Allergy & Precautions, NPO status , Patient's Chart, lab work & pertinent test results  Airway Mallampati: III  TM Distance: >3 FB Neck ROM: Full    Dental  (+) Partial Lower   Pulmonary former smoker,    Pulmonary exam normal breath sounds clear to auscultation       Cardiovascular hypertension, Normal cardiovascular exam Rhythm:Regular Rate:Normal     Neuro/Psych negative neurological ROS  negative psych ROS   GI/Hepatic negative GI ROS, Neg liver ROS,   Endo/Other  negative endocrine ROS  Renal/GU negative Renal ROS  negative genitourinary   Musculoskeletal   Abdominal   Peds  Hematology negative hematology ROS (+)   Anesthesia Other Findings   Reproductive/Obstetrics                             Anesthesia Physical Anesthesia Plan  ASA: II  Anesthesia Plan: MAC   Post-op Pain Management:    Induction:   PONV Risk Score and Plan:   Airway Management Planned: Natural Airway and Nasal Cannula  Additional Equipment:   Intra-op Plan:   Post-operative Plan:   Informed Consent: I have reviewed the patients History and Physical, chart, labs and discussed the procedure including the risks, benefits and alternatives for the proposed anesthesia with the patient or authorized representative who has indicated his/her understanding and acceptance.     Dental advisory given  Plan Discussed with: CRNA  Anesthesia Plan Comments:         Anesthesia Quick Evaluation

## 2019-09-27 NOTE — Transfer of Care (Signed)
Immediate Anesthesia Transfer of Care Note  Patient: Tammy [name]  Procedure(s) Performed: CATARACT EXTRACTION PHACO AND INTRAOCULAR LENS PLACEMENT (IOC) (CDE: 4.94) (Right Eye)  Patient Location: short stay  Anesthesia Type:MAC  Level of Consciousness: awake, alert  and patient cooperative  Airway & Oxygen Therapy: Patient Spontanous Breathing  Post-op Assessment: Report given to RN and Post -op Vital signs reviewed and stable  Post vital signs: Reviewed and stable  Last Vitals:  Vitals Value Taken Time  BP    Temp    Pulse    Resp    SpO2      Last Pain:  Vitals:   09/27/19 0902  PainSc: 0-No pain        SEE PACU FLOWSHEET FOR VITAL SIGNS Complications: No apparent anesthesia complications

## 2019-09-27 NOTE — Op Note (Signed)
Date of procedure: 09/27/19  Pre-operative diagnosis:  Visually significant combined form age-related cataract, Right Eye (H25.811)  Post-operative diagnosis:  Visually significant combined form age-related cataract, Right Eye (H25.811)  Procedure: Removal of cataract via phacoemulsification and insertion of intra-ocular lens [employer] PCB00  +19.0D into the capsular bag of the Right Eye  Attending surgeon: Gerda Diss. Shakia Sebastiano, MD, MA  Anesthesia: MAC, Topical Akten  Complications: None  Estimated Blood Loss: <8m (minimal)  Specimens: None  Implants: As above  Indications:  Visually significant age-related cataract, Right Eye  Procedure:  The patient was seen and identified in the pre-operative area. The operative eye was identified and dilated.  The operative eye was marked.  Topical anesthesia was administered to the operative eye.     The patient was then to the operative suite and placed in the supine position.  A timeout was performed confirming the patient, procedure to be performed, and all other relevant information.   The patient's face was prepped and draped in the usual fashion for intra-ocular surgery.  A lid speculum was placed into the operative eye and the surgical microscope moved into place and focused.  A superotemporal paracentesis was created using a 20 gauge paracentesis blade.  Shugarcaine was injected into the anterior chamber.  Viscoelastic was injected into the anterior chamber.  A temporal clear-corneal main wound incision was created using a 2.470mmicrokeratome.  A continuous curvilinear capsulorrhexis was initiated using an irrigating cystitome and completed using capsulorrhexis forceps.  Hydrodissection and hydrodeliniation were performed.  Viscoelastic was injected into the anterior chamber.  A phacoemulsification handpiece and a chopper as a second instrument were used to remove the nucleus and epinucleus. The irrigation/aspiration handpiece was  used to remove any remaining cortical material.   The capsular bag was reinflated with viscoelastic, checked, and found to be intact.  The intraocular lens was inserted into the capsular bag.  The irrigation/aspiration handpiece was used to remove any remaining viscoelastic.  The clear corneal wound and paracentesis wounds were then hydrated and checked with Weck-Cels to be watertight.  The lid-speculum and drape was removed, and the patient's face was cleaned with a wet and dry 4x4.  Maxitrol was instilled in the eye before a clear shield was taped over the eye. The patient was taken to the post-operative care unit in good condition, having tolerated the procedure well.  Post-Op Instructions: [facility] and will receive all other orders and instructions.

## 2019-09-27 NOTE — Interval H&P Note (Signed)
History and Physical Interval Note: The H and P was reviewed and updated. The patient was examined.  No changes were found after exam.  The surgical eye was marked.  09/27/2019 9:16 AM  Tammy [name]  has presented today for surgery, with the diagnosis of Nuclear sclerotic cataract - RIght eye.  The various methods of treatment have been discussed with the patient and family. After consideration of risks, benefits and other options for treatment, the patient has consented to  Procedure(s) with comments: CATARACT EXTRACTION PHACO AND INTRAOCULAR LENS PLACEMENT (IOC) (Right) - right as a surgical intervention.  The patient's history has been reviewed, patient examined, no change in status, stable for surgery.  I have reviewed the patient's chart and labs.  Questions were answered to the patient's satisfaction.     Baruch Goldmann

## 2019-09-27 NOTE — Discharge Instructions (Addendum)
Please discharge patient when stable, will follow up today with Dr. Wrzosek at the [facility] office immediately following discharge.  Leave shield in place until visit.  All paperwork with discharge instructions will be given at the office. ° °

## 2019-09-27 NOTE — Anesthesia Postprocedure Evaluation (Signed)
Anesthesia Post Note  Patient: Tammy [name]  Procedure(s) Performed: CATARACT EXTRACTION PHACO AND INTRAOCULAR LENS PLACEMENT (IOC) (CDE: 4.94) (Right Eye)  Patient location during evaluation: Short Stay Anesthesia Type: MAC Level of consciousness: awake and alert Pain management: pain level controlled Vital Signs Assessment: post-procedure vital signs reviewed and stable Respiratory status: spontaneous breathing Cardiovascular status: stable Postop Assessment: no apparent nausea or vomiting Anesthetic complications: no     Last Vitals:  Vitals:   09/27/19 0902  BP: 139/82  Pulse: 68  Resp: 18  SpO2: 99%    Last Pain:  Vitals:   09/27/19 0902  PainSc: 0-No pain                 Derris Millan

## 2019-09-27 NOTE — Anesthesia Procedure Notes (Signed)
Procedure Name: MAC Date/Time: 09/27/2019 9:30 AM Performed by: Vista Deck, CRNA Pre-anesthesia Checklist: Patient identified, Emergency Drugs available, Suction available, Timeout performed and Patient being monitored Patient Re-evaluated:Patient Re-evaluated prior to induction Oxygen Delivery Method: Nasal Cannula

## 2019-09-30 ENCOUNTER — Encounter (HOSPITAL_COMMUNITY): Payer: Self-pay | Admitting: Ophthalmology

## 2019-10-11 NOTE — H&P (Signed)
Surgical History & Physical  Patient Name: Tammy [name] DOB: 11-28-1946  Surgery: Cataract extraction with intraocular lens implant phacoemulsification; Left Eye  Surgeon: Baruch Goldmann MD Surgery Date:  10/18/2019 Pre-Op Date:  10/10/2019  HPI: A 17 Yr. old female patient 1. The patient complains of difficulty when viewing TV, reading closed caption, news scrolls on TV, which began 4 years ago. The left eye is affected. The episode is gradual. The condition's severity increased since last visit. Symptoms occur when the patient is inside, outside and reading. 2. The patient is returning after cataract post-op. The right eye is affected. Status post cataract post-op, which began 1 year ago: Since the last visit, the affected area feels improvement. The patient's vision is improved. Patient is following medication instructions.  Medical History: High Blood Pressure LDL  Review of Systems Allergic/Immunologic Seasonal Allergies All recorded systems are negative except as noted above.  Social   Former smoker   Medication Prednisolone-gatiflox-bromfenac,  Rosuvastatin, Levocetirizine, Losartan Potassium,   Sx/Procedures Phaco c IOL,  Rotator cuff repair, Hysterectomy,   Drug Allergies   NKDA  History & Physical: Heent:   NECK: supple without bruits LUNGS: lungs clear to auscultation CV: regular rate and rhythm Abdomen: soft and non-tender Impression & Plan: Assessment: 1.  CATARACT EXTRACTION STATUS; Right Eye (Z98.41) 2.  COMBINED FORMS AGE RELATED CATARACT; Left Eye (H25.812)  Plan: 1.  1 week after cataract surgery. Doing well with improved vision and normal eye pressure. Call with any problems or concerns. Continue Gati-Brom-Pred 2x/day for 3 more weeks. 2.  Cataract accounts for the patient's decreased vision. This visual impairment is not correctable with a tolerable change in glasses or contact lenses. Cataract surgery with an implantation of a new lens should  significantly improve the visual and functional status of the patient. Discussed all risks, benefits, alternatives, and potential complications. Discussed the procedures and recovery. Patient desires to have surgery. A-scan ordered and performed today for intra-ocular lens calculations. The surgery will be performed in order to improve vision for driving, reading, and for eye examinations. Recommend phacoemulsification with intra-ocular lens. Left Eye. Surgery required to correct imbalance of vision. Dilates well - shugarcaine by protocol.

## 2019-10-14 ENCOUNTER — Ambulatory Visit (HOSPITAL_COMMUNITY): Payer: Medicare Other

## 2019-10-14 DIAGNOSIS — H25812 Combined forms of age-related cataract, left eye: Secondary | ICD-10-CM | POA: Diagnosis not present

## 2019-10-16 ENCOUNTER — Other Ambulatory Visit (HOSPITAL_COMMUNITY)
Admission: RE | Admit: 2019-10-16 | Discharge: 2019-10-16 | Disposition: A | Discharge status: Home / Self Care | Payer: Medicare Other | Source: Ambulatory Visit | Attending: Ophthalmology | Admitting: Ophthalmology

## 2019-10-16 ENCOUNTER — Encounter (HOSPITAL_COMMUNITY)
Admission: RE | Admit: 2019-10-16 | Discharge: 2019-10-16 | Disposition: A | Discharge status: Home / Self Care | Payer: Medicare Other | Source: Ambulatory Visit | Attending: Ophthalmology | Admitting: Ophthalmology

## 2019-10-16 ENCOUNTER — Other Ambulatory Visit: Payer: Self-pay

## 2019-10-16 DIAGNOSIS — Z01812 Encounter for preprocedural laboratory examination: Secondary | ICD-10-CM | POA: Insufficient documentation

## 2019-10-16 DIAGNOSIS — Z20828 Contact with and (suspected) exposure to other viral communicable diseases: Secondary | ICD-10-CM | POA: Insufficient documentation

## 2019-10-16 LAB — SARS CORONAVIRUS 2 (TAT 6-24 HRS): SARS Coronavirus 2: NEGATIVE

## 2019-10-18 ENCOUNTER — Other Ambulatory Visit: Payer: Self-pay

## 2019-10-18 ENCOUNTER — Encounter (HOSPITAL_COMMUNITY)
Admission: RE | Disposition: A | Discharge status: Home / Self Care | Payer: Self-pay | Source: Home / Self Care | Attending: Ophthalmology

## 2019-10-18 ENCOUNTER — Encounter (HOSPITAL_COMMUNITY): Payer: Self-pay

## 2019-10-18 ENCOUNTER — Ambulatory Visit (HOSPITAL_COMMUNITY)
Admission: RE | Admit: 2019-10-18 | Discharge: 2019-10-18 | Disposition: A | Discharge status: Home / Self Care | Payer: Medicare Other | Attending: Ophthalmology | Admitting: Ophthalmology

## 2019-10-18 ENCOUNTER — Ambulatory Visit (HOSPITAL_COMMUNITY): Payer: Medicare Other | Admitting: Anesthesiology

## 2019-10-18 DIAGNOSIS — I10 Essential (primary) hypertension: Secondary | ICD-10-CM | POA: Insufficient documentation

## 2019-10-18 DIAGNOSIS — Z79899 Other long term (current) drug therapy: Secondary | ICD-10-CM | POA: Insufficient documentation

## 2019-10-18 DIAGNOSIS — H25812 Combined forms of age-related cataract, left eye: Secondary | ICD-10-CM | POA: Diagnosis not present

## 2019-10-18 DIAGNOSIS — Z87891 Personal history of nicotine dependence: Secondary | ICD-10-CM | POA: Insufficient documentation

## 2019-10-18 HISTORY — PX: CATARACT EXTRACTION W/PHACO: SHX586

## 2019-10-18 SURGERY — PHACOEMULSIFICATION, CATARACT, WITH IOL INSERTION
Anesthesia: Monitor Anesthesia Care | Site: Eye | Laterality: Left

## 2019-10-18 MED ORDER — EPINEPHRINE PF 1 MG/ML IJ SOLN
INTRAOCULAR | Status: DC | PRN
  Administered 2019-10-18: 500 mL

## 2019-10-18 MED ORDER — NEOMYCIN-POLYMYXIN-DEXAMETH 3.5-10000-0.1 OP SUSP
OPHTHALMIC | Status: DC | PRN
  Administered 2019-10-18: 1 [drp] via OPHTHALMIC

## 2019-10-18 MED ORDER — CYCLOPENTOLATE-PHENYLEPHRINE 0.2-1 % OP SOLN
1.0000 [drp] | OPHTHALMIC | Status: AC | PRN
  Administered 2019-10-18 (×3): 1 [drp] via OPHTHALMIC

## 2019-10-18 MED ORDER — SODIUM HYALURONATE 23 MG/ML IO SOLN
INTRAOCULAR | Status: DC | PRN
  Administered 2019-10-18: 0.6 mL via INTRAOCULAR

## 2019-10-18 MED ORDER — LIDOCAINE HCL (PF) 1 % IJ SOLN
INTRAOCULAR | Status: DC | PRN
  Administered 2019-10-18: 1 mL via OPHTHALMIC

## 2019-10-18 MED ORDER — BSS IO SOLN
INTRAOCULAR | Status: DC | PRN
  Administered 2019-10-18: 15 mL via INTRAOCULAR

## 2019-10-18 MED ORDER — PHENYLEPHRINE HCL 2.5 % OP SOLN
1.0000 [drp] | OPHTHALMIC | Status: AC | PRN
  Administered 2019-10-18 (×3): 1 [drp] via OPHTHALMIC

## 2019-10-18 MED ORDER — MIDAZOLAM HCL 2 MG/2ML IJ SOLN
INTRAMUSCULAR | Status: DC | PRN
  Administered 2019-10-18: 1 mg via INTRAVENOUS

## 2019-10-18 MED ORDER — MIDAZOLAM HCL 2 MG/2ML IJ SOLN
INTRAMUSCULAR | Status: AC
  Filled 2019-10-18: qty 2

## 2019-10-18 MED ORDER — POVIDONE-IODINE 5 % OP SOLN
OPHTHALMIC | Status: DC | PRN
  Administered 2019-10-18: 1 via OPHTHALMIC

## 2019-10-18 MED ORDER — PROVISC 10 MG/ML IO SOLN
INTRAOCULAR | Status: DC | PRN
  Administered 2019-10-18: 0.85 mL via INTRAOCULAR

## 2019-10-18 MED ORDER — LIDOCAINE HCL 3.5 % OP GEL
1.0000 "application " | Freq: Once | OPHTHALMIC | Status: AC
  Administered 2019-10-18: 1 via OPHTHALMIC

## 2019-10-18 MED ORDER — TETRACAINE HCL 0.5 % OP SOLN
1.0000 [drp] | OPHTHALMIC | Status: AC | PRN
  Administered 2019-10-18 (×3): 1 [drp] via OPHTHALMIC

## 2019-10-18 SURGICAL SUPPLY — 13 items

## 2019-10-18 NOTE — Transfer of Care (Signed)
Immediate Anesthesia Transfer of Care Note  Patient: Tammy [name]  Procedure(s) Performed: CATARACT EXTRACTION PHACO AND INTRAOCULAR LENS PLACEMENT (IOC) (Left Eye)  Patient Location: PACU  Anesthesia Type:MAC  Level of Consciousness: awake, alert  and oriented  Airway & Oxygen Therapy: Patient Spontanous Breathing  Post-op Assessment: Report given to RN and Post -op Vital signs reviewed and stable  Post vital signs: Reviewed and stable  Last Vitals:  Vitals Value Taken Time  BP    Temp    Pulse    Resp    SpO2      Last Pain:  Vitals:   10/18/19 0830  TempSrc: Oral  PainSc: 0-No pain      Patients Stated Pain Goal: 6 (06/29/22 3612)  Complications: No apparent anesthesia complications

## 2019-10-18 NOTE — Op Note (Signed)
Date of procedure: 10/18/19  Pre-operative diagnosis: Visually significant age-related combined cataract, Left Eye (H25.812)  Post-operative diagnosis: Visually significant age-related combined cataract, Left Eye (H25.812)  Procedure: Removal of cataract via phacoemulsification and insertion of intra-ocular lens Tammy [name] and [name]  +19.5D into the capsular bag of the Left Eye  Attending surgeon: Gerda Diss. Harlin Mazzoni, MD, MA  Anesthesia: MAC, Topical [place]  Complications: None  Estimated Blood Loss: <69m (minimal)  Specimens: None  Implants: As above  Indications:  Visually significant age-related cataract, Left Eye  Procedure:  The patient was seen and identified in the pre-operative area. The operative eye was identified and dilated.  The operative eye was marked.  Topical anesthesia was administered to the operative eye.     The patient was then to the operative suite and placed in the supine position.  A timeout was performed confirming the patient, procedure to be performed, and all other relevant information.   The patient's face was prepped and draped in the usual fashion for intra-ocular surgery.  A lid speculum was placed into the operative eye and the surgical microscope moved into place and focused.  An inferotemporal paracentesis was created using a 20 gauge paracentesis blade.  Shugarcaine was injected into the anterior chamber.  Viscoelastic was injected into the anterior chamber.  A temporal clear-corneal main wound incision was created using a 2.432mmicrokeratome.  A continuous curvilinear capsulorrhexis was initiated using an irrigating cystitome and completed using capsulorrhexis forceps.  Hydrodissection and hydrodeliniation were performed.  Viscoelastic was injected into the anterior chamber.  A phacoemulsification handpiece and a chopper as a second instrument were used to remove the nucleus and epinucleus. The irrigation/aspiration handpiece was used to remove  any remaining cortical material.   The capsular bag was reinflated with viscoelastic, checked, and found to be intact.  The intraocular lens was inserted into the capsular bag.  The irrigation/aspiration handpiece was used to remove any remaining viscoelastic.  The clear corneal wound and paracentesis wounds were then hydrated and checked with Weck-Cels to be watertight.  The lid-speculum and drape was removed, and the patient's face was cleaned with a wet and dry 4x4.  Maxitrol was instilled in the eye before a clear shield was taped over the eye. The patient was taken to the post-operative care unit in good condition, having tolerated the procedure well.  Post-Op Instructions: [facility] and will receive all other orders and instructions.

## 2019-10-18 NOTE — Anesthesia Postprocedure Evaluation (Signed)
Anesthesia Post Note  Patient: Tammy [name]  Procedure(s) Performed: CATARACT EXTRACTION PHACO AND INTRAOCULAR LENS PLACEMENT (IOC) (Left Eye)  Patient location during evaluation: PACU Anesthesia Type: MAC Level of consciousness: awake and alert Pain management: pain level controlled Vital Signs Assessment: post-procedure vital signs reviewed and stable Respiratory status: spontaneous breathing, nonlabored ventilation, respiratory function stable and patient connected to nasal cannula oxygen Cardiovascular status: stable and blood pressure returned to baseline Postop Assessment: no apparent nausea or vomiting Anesthetic complications: no     Last Vitals:  Vitals:   10/18/19 0830 10/18/19 0943  BP: 133/73 132/73  Pulse: 65 63  Resp: 18 20  Temp: 36.6 C 36.6 C  SpO2: 97% 98%    Last Pain:  Vitals:   10/18/19 0943  TempSrc: Oral  PainSc: 0-No pain                 Katie Faraone

## 2019-10-18 NOTE — Interval H&P Note (Signed)
History and Physical Interval Note: The H and P was reviewed and updated. The patient was examined.  No changes were found after exam.  The surgical eye was marked.  10/18/2019 9:18 AM  Tammy [name]  has presented today for surgery, with the diagnosis of Nuclear sclerotic cataract - Left eye.  The various methods of treatment have been discussed with the patient and family. After consideration of risks, benefits and other options for treatment, the patient has consented to  Procedure(s) with comments: CATARACT EXTRACTION PHACO AND INTRAOCULAR LENS PLACEMENT ([place]) (Left) - left as a surgical intervention.  The patient's history has been reviewed, patient examined, no change in status, stable for surgery.  I have reviewed the patient's chart and labs.  Questions were answered to the patient's satisfaction.     Baruch Goldmann

## 2019-10-18 NOTE — Anesthesia Preprocedure Evaluation (Signed)
Anesthesia Evaluation  Patient identified by MRN, date of birth, ID band Patient awake    Reviewed: Allergy & Precautions, NPO status , Patient's Chart, lab work & pertinent test results  Airway Mallampati: II  TM Distance: >3 FB Neck ROM: Full    Dental  (+) Partial [place] :   Pulmonary former smoker,    Pulmonary exam normal breath sounds clear to auscultation       Cardiovascular Exercise Tolerance: Good hypertension, Pt. on medications  Rhythm:Regular Rate:Normal     Neuro/Psych negative neurological ROS  negative psych ROS   GI/Hepatic negative GI ROS, Neg liver ROS,   Endo/Other  negative endocrine ROS  Renal/GU negative Renal ROS     Musculoskeletal   Abdominal   Peds  Hematology negative hematology ROS (+)   Anesthesia Other Findings   Reproductive/Obstetrics                            Anesthesia Physical Anesthesia Plan  ASA: II  Anesthesia Plan: MAC   Post-op Pain Management:    Induction:   PONV Risk Score and Plan:   Airway Management Planned: Natural Airway and Nasal Cannula  Additional Equipment:   Intra-op Plan:   Post-operative Plan:   Informed Consent: I have reviewed the patients History and Physical, chart, labs and discussed the procedure including the risks, benefits and alternatives for the proposed anesthesia with the patient or authorized representative who has indicated his/her understanding and acceptance.       Plan Discussed with: CRNA  Anesthesia Plan Comments:         Anesthesia Quick Evaluation

## 2019-10-18 NOTE — Discharge Instructions (Addendum)
Please discharge patient when stable, will follow up today with Dr. Wrzosek at the [facility] office immediately following discharge.  Leave shield in place until visit.  All paperwork with discharge instructions will be given at the office. ° ° ° ° °Monitored Anesthesia Care, Care After °These instructions provide you with information about caring for yourself after your procedure. Your health care provider may also give you more specific instructions. Your treatment has been planned according to current medical practices, but problems sometimes occur. Call your health care provider if you have any problems or questions after your procedure. °What can I expect after the procedure? °After your procedure, you may: °· Feel sleepy for several hours. °· Feel clumsy and have poor balance for several hours. °· Feel forgetful about what happened after the procedure. °· Have poor judgment for several hours. °· Feel nauseous or vomit. °· Have a sore throat if you had a breathing tube during the procedure. °Follow these instructions at home: °For at least 24 hours after the procedure: ° °  ° °· Have a responsible adult stay with you. It is important to have someone help care for you until you are awake and alert. °· Rest as needed. °· Do not: °? Participate in activities in which you could fall or become injured. °? Drive. °? Use heavy machinery. °? Drink alcohol. °? Take sleeping pills or medicines that cause drowsiness. °? Make important decisions or sign legal documents. °? Take care of children on your own. °Eating and drinking °· Follow the diet that is recommended by your health care provider. °· If you vomit, drink water, juice, or soup when you can drink without vomiting. °· Make sure you have little or no nausea before eating solid foods. °General instructions °· Take over-the-counter and prescription medicines only as told by your health care provider. °· If you have sleep apnea, surgery and certain  medicines can increase your risk for breathing problems. Follow instructions from your health care provider about wearing your sleep device: °? Anytime you are sleeping, including during daytime naps. °? While taking prescription pain medicines, sleeping medicines, or medicines that make you drowsy. °· If you smoke, do not smoke without supervision. °· Keep all follow-up visits as told by your health care provider. This is important. °Contact a health care provider if: °· You keep feeling nauseous or you keep vomiting. °· You feel light-headed. °· You develop a rash. °· You have a fever. °Get help right away if: °· You have trouble breathing. °Summary °· For several hours after your procedure, you may feel sleepy and have poor judgment. °· Have a responsible adult stay with you for at least 24 hours or until you are awake and alert. °This information is not intended to replace advice given to you by your health care provider. Make sure you discuss any questions you have with your health care provider. °Document Released: 03/06/2016 Document Revised: 02/12/2018 Document Reviewed: 03/06/2016 °Elsevier Patient Education © 2020 [employer] ° °

## 2019-10-21 ENCOUNTER — Encounter (HOSPITAL_COMMUNITY): Payer: Self-pay | Admitting: Ophthalmology

## 2019-10-21 DIAGNOSIS — R7303 Prediabetes: Secondary | ICD-10-CM | POA: Diagnosis not present

## 2019-10-21 DIAGNOSIS — I129 Hypertensive chronic kidney disease with stage 1 through stage 4 chronic kidney disease, or unspecified chronic kidney disease: Secondary | ICD-10-CM | POA: Diagnosis not present

## 2019-10-21 DIAGNOSIS — N182 Chronic kidney disease, stage 2 (mild): Secondary | ICD-10-CM | POA: Diagnosis not present

## 2019-10-21 DIAGNOSIS — G4700 Insomnia, unspecified: Secondary | ICD-10-CM | POA: Diagnosis not present

## 2019-10-21 DIAGNOSIS — R7301 Impaired fasting glucose: Secondary | ICD-10-CM | POA: Diagnosis not present

## 2019-10-21 DIAGNOSIS — I10 Essential (primary) hypertension: Secondary | ICD-10-CM | POA: Diagnosis not present

## 2019-10-21 DIAGNOSIS — K219 Gastro-esophageal reflux disease without esophagitis: Secondary | ICD-10-CM | POA: Diagnosis not present

## 2019-10-21 DIAGNOSIS — D509 Iron deficiency anemia, unspecified: Secondary | ICD-10-CM | POA: Diagnosis not present

## 2019-10-21 DIAGNOSIS — J302 Other seasonal allergic rhinitis: Secondary | ICD-10-CM | POA: Diagnosis not present

## 2019-10-21 DIAGNOSIS — E782 Mixed hyperlipidemia: Secondary | ICD-10-CM | POA: Diagnosis not present

## 2019-10-21 DIAGNOSIS — G720 Drug-induced myopathy: Secondary | ICD-10-CM | POA: Diagnosis not present

## 2019-10-22 DIAGNOSIS — E782 Mixed hyperlipidemia: Secondary | ICD-10-CM | POA: Diagnosis not present

## 2019-10-22 DIAGNOSIS — R7301 Impaired fasting glucose: Secondary | ICD-10-CM | POA: Diagnosis not present

## 2019-10-22 DIAGNOSIS — I10 Essential (primary) hypertension: Secondary | ICD-10-CM | POA: Diagnosis not present

## 2019-10-22 DIAGNOSIS — R7303 Prediabetes: Secondary | ICD-10-CM | POA: Diagnosis not present

## 2019-10-22 DIAGNOSIS — D509 Iron deficiency anemia, unspecified: Secondary | ICD-10-CM | POA: Diagnosis not present

## 2019-11-04 DIAGNOSIS — J302 Other seasonal allergic rhinitis: Secondary | ICD-10-CM | POA: Diagnosis not present

## 2019-11-04 DIAGNOSIS — R7303 Prediabetes: Secondary | ICD-10-CM | POA: Diagnosis not present

## 2019-11-04 DIAGNOSIS — I129 Hypertensive chronic kidney disease with stage 1 through stage 4 chronic kidney disease, or unspecified chronic kidney disease: Secondary | ICD-10-CM | POA: Diagnosis not present

## 2019-11-04 DIAGNOSIS — Z23 Encounter for immunization: Secondary | ICD-10-CM | POA: Diagnosis not present

## 2019-11-04 DIAGNOSIS — E782 Mixed hyperlipidemia: Secondary | ICD-10-CM | POA: Diagnosis not present

## 2019-11-04 DIAGNOSIS — D509 Iron deficiency anemia, unspecified: Secondary | ICD-10-CM | POA: Diagnosis not present

## 2019-11-04 DIAGNOSIS — I10 Essential (primary) hypertension: Secondary | ICD-10-CM | POA: Diagnosis not present

## 2019-11-04 DIAGNOSIS — R7301 Impaired fasting glucose: Secondary | ICD-10-CM | POA: Diagnosis not present

## 2019-11-08 DIAGNOSIS — Z23 Encounter for immunization: Secondary | ICD-10-CM | POA: Diagnosis not present

## 2019-12-06 DIAGNOSIS — N182 Chronic kidney disease, stage 2 (mild): Secondary | ICD-10-CM | POA: Diagnosis not present

## 2019-12-06 DIAGNOSIS — E7849 Other hyperlipidemia: Secondary | ICD-10-CM | POA: Diagnosis not present

## 2019-12-06 DIAGNOSIS — Z23 Encounter for immunization: Secondary | ICD-10-CM | POA: Diagnosis not present

## 2019-12-06 DIAGNOSIS — I129 Hypertensive chronic kidney disease with stage 1 through stage 4 chronic kidney disease, or unspecified chronic kidney disease: Secondary | ICD-10-CM | POA: Diagnosis not present

## 2019-12-21 DIAGNOSIS — Z23 Encounter for immunization: Secondary | ICD-10-CM | POA: Diagnosis not present

## 2020-01-20 DIAGNOSIS — Z23 Encounter for immunization: Secondary | ICD-10-CM | POA: Diagnosis not present

## 2020-02-27 ENCOUNTER — Ambulatory Visit: Payer: Medicare Other | Admitting: Family Medicine

## 2020-05-11 ENCOUNTER — Ambulatory Visit (HOSPITAL_COMMUNITY): Payer: Medicare Other

## 2020-05-21 DIAGNOSIS — R7301 Impaired fasting glucose: Secondary | ICD-10-CM | POA: Diagnosis not present

## 2020-05-21 DIAGNOSIS — J040 Acute laryngitis: Secondary | ICD-10-CM | POA: Diagnosis not present

## 2020-05-21 DIAGNOSIS — I129 Hypertensive chronic kidney disease with stage 1 through stage 4 chronic kidney disease, or unspecified chronic kidney disease: Secondary | ICD-10-CM | POA: Diagnosis not present

## 2020-05-21 DIAGNOSIS — D509 Iron deficiency anemia, unspecified: Secondary | ICD-10-CM | POA: Diagnosis not present

## 2020-05-21 DIAGNOSIS — Z6829 Body mass index (BMI) 29.0-29.9, adult: Secondary | ICD-10-CM | POA: Diagnosis not present

## 2020-05-21 DIAGNOSIS — E782 Mixed hyperlipidemia: Secondary | ICD-10-CM | POA: Diagnosis not present

## 2020-05-21 DIAGNOSIS — E7849 Other hyperlipidemia: Secondary | ICD-10-CM | POA: Diagnosis not present

## 2020-05-21 DIAGNOSIS — Z23 Encounter for immunization: Secondary | ICD-10-CM | POA: Diagnosis not present

## 2020-05-21 DIAGNOSIS — M25551 Pain in right hip: Secondary | ICD-10-CM | POA: Diagnosis not present

## 2020-05-21 DIAGNOSIS — M545 Low back pain: Secondary | ICD-10-CM | POA: Diagnosis not present

## 2020-05-26 DIAGNOSIS — Z6829 Body mass index (BMI) 29.0-29.9, adult: Secondary | ICD-10-CM | POA: Diagnosis not present

## 2020-05-26 DIAGNOSIS — J302 Other seasonal allergic rhinitis: Secondary | ICD-10-CM | POA: Diagnosis not present

## 2020-05-26 DIAGNOSIS — R7301 Impaired fasting glucose: Secondary | ICD-10-CM | POA: Diagnosis not present

## 2020-05-26 DIAGNOSIS — D509 Iron deficiency anemia, unspecified: Secondary | ICD-10-CM | POA: Diagnosis not present

## 2020-05-26 DIAGNOSIS — R7303 Prediabetes: Secondary | ICD-10-CM | POA: Diagnosis not present

## 2020-05-26 DIAGNOSIS — G720 Drug-induced myopathy: Secondary | ICD-10-CM | POA: Diagnosis not present

## 2020-05-26 DIAGNOSIS — K219 Gastro-esophageal reflux disease without esophagitis: Secondary | ICD-10-CM | POA: Diagnosis not present

## 2020-05-26 DIAGNOSIS — N182 Chronic kidney disease, stage 2 (mild): Secondary | ICD-10-CM | POA: Diagnosis not present

## 2020-05-26 DIAGNOSIS — I129 Hypertensive chronic kidney disease with stage 1 through stage 4 chronic kidney disease, or unspecified chronic kidney disease: Secondary | ICD-10-CM | POA: Diagnosis not present

## 2020-05-26 DIAGNOSIS — E782 Mixed hyperlipidemia: Secondary | ICD-10-CM | POA: Diagnosis not present

## 2020-05-26 DIAGNOSIS — G4700 Insomnia, unspecified: Secondary | ICD-10-CM | POA: Diagnosis not present

## 2020-05-26 DIAGNOSIS — I10 Essential (primary) hypertension: Secondary | ICD-10-CM | POA: Diagnosis not present

## 2020-05-26 DIAGNOSIS — Z0001 Encounter for general adult medical examination with abnormal findings: Secondary | ICD-10-CM | POA: Diagnosis not present

## 2020-05-26 DIAGNOSIS — E663 Overweight: Secondary | ICD-10-CM | POA: Diagnosis not present

## 2020-06-01 DIAGNOSIS — J019 Acute sinusitis, unspecified: Secondary | ICD-10-CM | POA: Diagnosis not present

## 2020-06-10 DIAGNOSIS — J019 Acute sinusitis, unspecified: Secondary | ICD-10-CM | POA: Diagnosis not present

## 2020-07-15 DIAGNOSIS — Z0001 Encounter for general adult medical examination with abnormal findings: Secondary | ICD-10-CM | POA: Diagnosis not present

## 2020-07-15 DIAGNOSIS — D509 Iron deficiency anemia, unspecified: Secondary | ICD-10-CM | POA: Diagnosis not present

## 2020-07-15 DIAGNOSIS — I10 Essential (primary) hypertension: Secondary | ICD-10-CM | POA: Diagnosis not present

## 2020-07-15 DIAGNOSIS — R7301 Impaired fasting glucose: Secondary | ICD-10-CM | POA: Diagnosis not present

## 2020-07-15 DIAGNOSIS — J302 Other seasonal allergic rhinitis: Secondary | ICD-10-CM | POA: Diagnosis not present

## 2020-07-15 DIAGNOSIS — G4700 Insomnia, unspecified: Secondary | ICD-10-CM | POA: Diagnosis not present

## 2020-07-15 DIAGNOSIS — N182 Chronic kidney disease, stage 2 (mild): Secondary | ICD-10-CM | POA: Diagnosis not present

## 2020-07-15 DIAGNOSIS — E782 Mixed hyperlipidemia: Secondary | ICD-10-CM | POA: Diagnosis not present

## 2020-07-15 DIAGNOSIS — I129 Hypertensive chronic kidney disease with stage 1 through stage 4 chronic kidney disease, or unspecified chronic kidney disease: Secondary | ICD-10-CM | POA: Diagnosis not present

## 2020-07-20 DIAGNOSIS — L239 Allergic contact dermatitis, unspecified cause: Secondary | ICD-10-CM | POA: Diagnosis not present

## 2020-08-13 DIAGNOSIS — Z23 Encounter for immunization: Secondary | ICD-10-CM | POA: Diagnosis not present

## 2020-08-13 DIAGNOSIS — J302 Other seasonal allergic rhinitis: Secondary | ICD-10-CM | POA: Diagnosis not present

## 2020-08-13 DIAGNOSIS — R7301 Impaired fasting glucose: Secondary | ICD-10-CM | POA: Diagnosis not present

## 2020-08-13 DIAGNOSIS — I10 Essential (primary) hypertension: Secondary | ICD-10-CM | POA: Diagnosis not present

## 2020-08-13 DIAGNOSIS — G4700 Insomnia, unspecified: Secondary | ICD-10-CM | POA: Diagnosis not present

## 2020-08-13 DIAGNOSIS — D509 Iron deficiency anemia, unspecified: Secondary | ICD-10-CM | POA: Diagnosis not present

## 2020-08-13 DIAGNOSIS — E782 Mixed hyperlipidemia: Secondary | ICD-10-CM | POA: Diagnosis not present

## 2020-08-13 DIAGNOSIS — N182 Chronic kidney disease, stage 2 (mild): Secondary | ICD-10-CM | POA: Diagnosis not present

## 2020-08-13 DIAGNOSIS — Z0001 Encounter for general adult medical examination with abnormal findings: Secondary | ICD-10-CM | POA: Diagnosis not present

## 2020-08-13 DIAGNOSIS — I129 Hypertensive chronic kidney disease with stage 1 through stage 4 chronic kidney disease, or unspecified chronic kidney disease: Secondary | ICD-10-CM | POA: Diagnosis not present

## 2020-09-03 DIAGNOSIS — Z23 Encounter for immunization: Secondary | ICD-10-CM | POA: Diagnosis not present

## 2020-12-23 DIAGNOSIS — E782 Mixed hyperlipidemia: Secondary | ICD-10-CM | POA: Diagnosis not present

## 2020-12-23 DIAGNOSIS — I129 Hypertensive chronic kidney disease with stage 1 through stage 4 chronic kidney disease, or unspecified chronic kidney disease: Secondary | ICD-10-CM | POA: Diagnosis not present

## 2020-12-23 DIAGNOSIS — J060 Acute laryngopharyngitis: Secondary | ICD-10-CM | POA: Diagnosis not present

## 2020-12-23 DIAGNOSIS — E7849 Other hyperlipidemia: Secondary | ICD-10-CM | POA: Diagnosis not present

## 2020-12-23 DIAGNOSIS — Z6829 Body mass index (BMI) 29.0-29.9, adult: Secondary | ICD-10-CM | POA: Diagnosis not present

## 2020-12-23 DIAGNOSIS — R7301 Impaired fasting glucose: Secondary | ICD-10-CM | POA: Diagnosis not present

## 2020-12-23 DIAGNOSIS — D509 Iron deficiency anemia, unspecified: Secondary | ICD-10-CM | POA: Diagnosis not present

## 2020-12-23 DIAGNOSIS — Z0001 Encounter for general adult medical examination with abnormal findings: Secondary | ICD-10-CM | POA: Diagnosis not present

## 2020-12-23 DIAGNOSIS — Z0000 Encounter for general adult medical examination without abnormal findings: Secondary | ICD-10-CM | POA: Diagnosis not present

## 2020-12-23 DIAGNOSIS — E559 Vitamin D deficiency, unspecified: Secondary | ICD-10-CM | POA: Diagnosis not present

## 2020-12-28 DIAGNOSIS — Z6829 Body mass index (BMI) 29.0-29.9, adult: Secondary | ICD-10-CM | POA: Diagnosis not present

## 2020-12-28 DIAGNOSIS — J302 Other seasonal allergic rhinitis: Secondary | ICD-10-CM | POA: Diagnosis not present

## 2020-12-28 DIAGNOSIS — E782 Mixed hyperlipidemia: Secondary | ICD-10-CM | POA: Diagnosis not present

## 2020-12-28 DIAGNOSIS — D509 Iron deficiency anemia, unspecified: Secondary | ICD-10-CM | POA: Diagnosis not present

## 2020-12-28 DIAGNOSIS — R7301 Impaired fasting glucose: Secondary | ICD-10-CM | POA: Diagnosis not present

## 2020-12-28 DIAGNOSIS — D72828 Other elevated white blood cell count: Secondary | ICD-10-CM | POA: Diagnosis not present

## 2020-12-28 DIAGNOSIS — R7303 Prediabetes: Secondary | ICD-10-CM | POA: Diagnosis not present

## 2020-12-28 DIAGNOSIS — I10 Essential (primary) hypertension: Secondary | ICD-10-CM | POA: Diagnosis not present

## 2020-12-28 DIAGNOSIS — E663 Overweight: Secondary | ICD-10-CM | POA: Diagnosis not present

## 2021-01-25 DIAGNOSIS — J302 Other seasonal allergic rhinitis: Secondary | ICD-10-CM | POA: Diagnosis not present

## 2021-01-25 DIAGNOSIS — D72828 Other elevated white blood cell count: Secondary | ICD-10-CM | POA: Diagnosis not present

## 2021-01-25 DIAGNOSIS — D509 Iron deficiency anemia, unspecified: Secondary | ICD-10-CM | POA: Diagnosis not present

## 2021-01-25 DIAGNOSIS — E663 Overweight: Secondary | ICD-10-CM | POA: Diagnosis not present

## 2021-01-25 DIAGNOSIS — Z6829 Body mass index (BMI) 29.0-29.9, adult: Secondary | ICD-10-CM | POA: Diagnosis not present

## 2021-01-25 DIAGNOSIS — I10 Essential (primary) hypertension: Secondary | ICD-10-CM | POA: Diagnosis not present

## 2021-01-25 DIAGNOSIS — R7303 Prediabetes: Secondary | ICD-10-CM | POA: Diagnosis not present

## 2021-01-25 DIAGNOSIS — E782 Mixed hyperlipidemia: Secondary | ICD-10-CM | POA: Diagnosis not present

## 2021-02-04 DIAGNOSIS — D509 Iron deficiency anemia, unspecified: Secondary | ICD-10-CM | POA: Diagnosis not present

## 2021-02-04 DIAGNOSIS — Z6829 Body mass index (BMI) 29.0-29.9, adult: Secondary | ICD-10-CM | POA: Diagnosis not present

## 2021-02-04 DIAGNOSIS — E7849 Other hyperlipidemia: Secondary | ICD-10-CM | POA: Diagnosis not present

## 2021-02-04 DIAGNOSIS — J060 Acute laryngopharyngitis: Secondary | ICD-10-CM | POA: Diagnosis not present

## 2021-02-04 DIAGNOSIS — E611 Iron deficiency: Secondary | ICD-10-CM | POA: Diagnosis not present

## 2021-02-04 DIAGNOSIS — E782 Mixed hyperlipidemia: Secondary | ICD-10-CM | POA: Diagnosis not present

## 2021-02-04 DIAGNOSIS — I129 Hypertensive chronic kidney disease with stage 1 through stage 4 chronic kidney disease, or unspecified chronic kidney disease: Secondary | ICD-10-CM | POA: Diagnosis not present

## 2021-02-04 DIAGNOSIS — Z0001 Encounter for general adult medical examination with abnormal findings: Secondary | ICD-10-CM | POA: Diagnosis not present

## 2021-02-04 DIAGNOSIS — Z0000 Encounter for general adult medical examination without abnormal findings: Secondary | ICD-10-CM | POA: Diagnosis not present

## 2021-02-04 DIAGNOSIS — D72828 Other elevated white blood cell count: Secondary | ICD-10-CM | POA: Diagnosis not present

## 2021-02-04 DIAGNOSIS — R5383 Other fatigue: Secondary | ICD-10-CM | POA: Diagnosis not present

## 2021-02-08 DIAGNOSIS — R5383 Other fatigue: Secondary | ICD-10-CM | POA: Diagnosis not present

## 2021-02-24 DIAGNOSIS — I10 Essential (primary) hypertension: Secondary | ICD-10-CM | POA: Diagnosis not present

## 2021-02-24 DIAGNOSIS — D509 Iron deficiency anemia, unspecified: Secondary | ICD-10-CM | POA: Diagnosis not present

## 2021-02-24 DIAGNOSIS — J302 Other seasonal allergic rhinitis: Secondary | ICD-10-CM | POA: Diagnosis not present

## 2021-02-24 DIAGNOSIS — Z6829 Body mass index (BMI) 29.0-29.9, adult: Secondary | ICD-10-CM | POA: Diagnosis not present

## 2021-02-24 DIAGNOSIS — R7303 Prediabetes: Secondary | ICD-10-CM | POA: Diagnosis not present

## 2021-02-24 DIAGNOSIS — D72828 Other elevated white blood cell count: Secondary | ICD-10-CM | POA: Diagnosis not present

## 2021-02-24 DIAGNOSIS — E782 Mixed hyperlipidemia: Secondary | ICD-10-CM | POA: Diagnosis not present

## 2021-02-24 DIAGNOSIS — E663 Overweight: Secondary | ICD-10-CM | POA: Diagnosis not present

## 2021-03-17 DIAGNOSIS — J302 Other seasonal allergic rhinitis: Secondary | ICD-10-CM | POA: Diagnosis not present

## 2021-03-28 DIAGNOSIS — K219 Gastro-esophageal reflux disease without esophagitis: Secondary | ICD-10-CM | POA: Diagnosis not present

## 2021-03-28 DIAGNOSIS — F419 Anxiety disorder, unspecified: Secondary | ICD-10-CM | POA: Diagnosis not present

## 2021-05-27 DIAGNOSIS — F419 Anxiety disorder, unspecified: Secondary | ICD-10-CM | POA: Diagnosis not present

## 2021-05-27 DIAGNOSIS — J309 Allergic rhinitis, unspecified: Secondary | ICD-10-CM | POA: Diagnosis not present

## 2021-05-27 DIAGNOSIS — K219 Gastro-esophageal reflux disease without esophagitis: Secondary | ICD-10-CM | POA: Diagnosis not present

## 2021-05-31 DIAGNOSIS — Z20822 Contact with and (suspected) exposure to covid-19: Secondary | ICD-10-CM | POA: Diagnosis not present

## 2021-06-08 DIAGNOSIS — E782 Mixed hyperlipidemia: Secondary | ICD-10-CM | POA: Diagnosis not present

## 2021-06-08 DIAGNOSIS — I10 Essential (primary) hypertension: Secondary | ICD-10-CM | POA: Diagnosis not present

## 2021-06-08 DIAGNOSIS — R7301 Impaired fasting glucose: Secondary | ICD-10-CM | POA: Diagnosis not present

## 2021-06-11 DIAGNOSIS — I10 Essential (primary) hypertension: Secondary | ICD-10-CM | POA: Diagnosis not present

## 2021-06-11 DIAGNOSIS — Z20822 Contact with and (suspected) exposure to covid-19: Secondary | ICD-10-CM | POA: Diagnosis not present

## 2021-06-11 DIAGNOSIS — R32 Unspecified urinary incontinence: Secondary | ICD-10-CM | POA: Diagnosis not present

## 2021-06-11 DIAGNOSIS — E663 Overweight: Secondary | ICD-10-CM | POA: Diagnosis not present

## 2021-06-11 DIAGNOSIS — E782 Mixed hyperlipidemia: Secondary | ICD-10-CM | POA: Diagnosis not present

## 2021-06-11 DIAGNOSIS — D509 Iron deficiency anemia, unspecified: Secondary | ICD-10-CM | POA: Diagnosis not present

## 2021-06-11 DIAGNOSIS — R7303 Prediabetes: Secondary | ICD-10-CM | POA: Diagnosis not present

## 2021-06-11 DIAGNOSIS — J302 Other seasonal allergic rhinitis: Secondary | ICD-10-CM | POA: Diagnosis not present

## 2021-06-23 DIAGNOSIS — H5200 Hypermetropia, unspecified eye: Secondary | ICD-10-CM | POA: Diagnosis not present

## 2021-06-23 DIAGNOSIS — Z0100 Encounter for examination of eyes and vision without abnormal findings: Secondary | ICD-10-CM | POA: Diagnosis not present

## 2021-06-24 DIAGNOSIS — J069 Acute upper respiratory infection, unspecified: Secondary | ICD-10-CM | POA: Diagnosis not present

## 2021-06-24 DIAGNOSIS — J309 Allergic rhinitis, unspecified: Secondary | ICD-10-CM | POA: Diagnosis not present

## 2021-07-09 DIAGNOSIS — Z20822 Contact with and (suspected) exposure to covid-19: Secondary | ICD-10-CM | POA: Diagnosis not present

## 2021-07-28 DIAGNOSIS — F419 Anxiety disorder, unspecified: Secondary | ICD-10-CM | POA: Diagnosis not present

## 2021-07-28 DIAGNOSIS — K219 Gastro-esophageal reflux disease without esophagitis: Secondary | ICD-10-CM | POA: Diagnosis not present

## 2021-08-18 DIAGNOSIS — Z23 Encounter for immunization: Secondary | ICD-10-CM | POA: Diagnosis not present

## 2021-08-26 DIAGNOSIS — Z23 Encounter for immunization: Secondary | ICD-10-CM | POA: Diagnosis not present

## 2021-09-14 DIAGNOSIS — Z23 Encounter for immunization: Secondary | ICD-10-CM | POA: Diagnosis not present

## 2021-09-27 DIAGNOSIS — F419 Anxiety disorder, unspecified: Secondary | ICD-10-CM | POA: Diagnosis not present

## 2021-09-27 DIAGNOSIS — K219 Gastro-esophageal reflux disease without esophagitis: Secondary | ICD-10-CM | POA: Diagnosis not present

## 2021-10-15 DIAGNOSIS — L989 Disorder of the skin and subcutaneous tissue, unspecified: Secondary | ICD-10-CM | POA: Diagnosis not present

## 2021-10-15 DIAGNOSIS — R635 Abnormal weight gain: Secondary | ICD-10-CM | POA: Diagnosis not present

## 2021-10-15 DIAGNOSIS — R0609 Other forms of dyspnea: Secondary | ICD-10-CM | POA: Diagnosis not present

## 2021-11-06 DIAGNOSIS — Z20822 Contact with and (suspected) exposure to covid-19: Secondary | ICD-10-CM | POA: Diagnosis not present

## 2021-11-26 DIAGNOSIS — I10 Essential (primary) hypertension: Secondary | ICD-10-CM | POA: Diagnosis not present

## 2021-11-26 DIAGNOSIS — E782 Mixed hyperlipidemia: Secondary | ICD-10-CM | POA: Diagnosis not present

## 2021-12-06 DIAGNOSIS — L821 Other seborrheic keratosis: Secondary | ICD-10-CM | POA: Diagnosis not present

## 2021-12-10 DIAGNOSIS — R7303 Prediabetes: Secondary | ICD-10-CM | POA: Diagnosis not present

## 2021-12-10 DIAGNOSIS — Z20822 Contact with and (suspected) exposure to covid-19: Secondary | ICD-10-CM | POA: Diagnosis not present

## 2021-12-10 DIAGNOSIS — E782 Mixed hyperlipidemia: Secondary | ICD-10-CM | POA: Diagnosis not present

## 2021-12-12 LAB — LAB REPORT - SCANNED: EGFR: 55

## 2021-12-15 DIAGNOSIS — R7303 Prediabetes: Secondary | ICD-10-CM | POA: Diagnosis not present

## 2021-12-15 DIAGNOSIS — Z6830 Body mass index (BMI) 30.0-30.9, adult: Secondary | ICD-10-CM | POA: Diagnosis not present

## 2021-12-15 DIAGNOSIS — Z0001 Encounter for general adult medical examination with abnormal findings: Secondary | ICD-10-CM | POA: Diagnosis not present

## 2021-12-15 DIAGNOSIS — E782 Mixed hyperlipidemia: Secondary | ICD-10-CM | POA: Diagnosis not present

## 2021-12-15 DIAGNOSIS — J302 Other seasonal allergic rhinitis: Secondary | ICD-10-CM | POA: Diagnosis not present

## 2021-12-15 DIAGNOSIS — I10 Essential (primary) hypertension: Secondary | ICD-10-CM | POA: Diagnosis not present

## 2021-12-15 DIAGNOSIS — E669 Obesity, unspecified: Secondary | ICD-10-CM | POA: Diagnosis not present

## 2021-12-15 DIAGNOSIS — R32 Unspecified urinary incontinence: Secondary | ICD-10-CM | POA: Diagnosis not present

## 2021-12-15 DIAGNOSIS — D509 Iron deficiency anemia, unspecified: Secondary | ICD-10-CM | POA: Diagnosis not present

## 2021-12-16 ENCOUNTER — Other Ambulatory Visit (HOSPITAL_COMMUNITY): Payer: Self-pay | Admitting: Internal Medicine

## 2021-12-16 DIAGNOSIS — Z1231 Encounter for screening mammogram for malignant neoplasm of breast: Secondary | ICD-10-CM

## 2021-12-22 ENCOUNTER — Other Ambulatory Visit: Payer: Self-pay

## 2021-12-22 ENCOUNTER — Ambulatory Visit (HOSPITAL_COMMUNITY)
Admission: RE | Admit: 2021-12-22 | Discharge: 2021-12-22 | Disposition: A | Discharge status: Home / Self Care | Payer: Medicare Other | Source: Ambulatory Visit | Attending: Internal Medicine | Admitting: Internal Medicine

## 2021-12-22 DIAGNOSIS — Z1231 Encounter for screening mammogram for malignant neoplasm of breast: Secondary | ICD-10-CM | POA: Diagnosis not present

## 2022-02-04 DIAGNOSIS — M2550 Pain in unspecified joint: Secondary | ICD-10-CM | POA: Diagnosis not present

## 2022-02-08 DIAGNOSIS — F430 Acute stress reaction: Secondary | ICD-10-CM | POA: Diagnosis not present

## 2022-02-10 DIAGNOSIS — Z20822 Contact with and (suspected) exposure to covid-19: Secondary | ICD-10-CM | POA: Diagnosis not present

## 2022-03-11 DIAGNOSIS — Z20822 Contact with and (suspected) exposure to covid-19: Secondary | ICD-10-CM | POA: Diagnosis not present

## 2022-03-15 DIAGNOSIS — G4700 Insomnia, unspecified: Secondary | ICD-10-CM | POA: Diagnosis not present

## 2022-03-15 DIAGNOSIS — J309 Allergic rhinitis, unspecified: Secondary | ICD-10-CM | POA: Diagnosis not present

## 2022-03-15 DIAGNOSIS — F430 Acute stress reaction: Secondary | ICD-10-CM | POA: Diagnosis not present

## 2022-03-17 DIAGNOSIS — Z20822 Contact with and (suspected) exposure to covid-19: Secondary | ICD-10-CM | POA: Diagnosis not present

## 2022-03-18 DIAGNOSIS — Z20822 Contact with and (suspected) exposure to covid-19: Secondary | ICD-10-CM | POA: Diagnosis not present

## 2022-03-23 DIAGNOSIS — Z20822 Contact with and (suspected) exposure to covid-19: Secondary | ICD-10-CM | POA: Diagnosis not present

## 2022-03-25 DIAGNOSIS — Z20822 Contact with and (suspected) exposure to covid-19: Secondary | ICD-10-CM | POA: Diagnosis not present

## 2022-03-28 DIAGNOSIS — Z20822 Contact with and (suspected) exposure to covid-19: Secondary | ICD-10-CM | POA: Diagnosis not present

## 2022-03-29 DIAGNOSIS — Z20822 Contact with and (suspected) exposure to covid-19: Secondary | ICD-10-CM | POA: Diagnosis not present

## 2022-04-02 DIAGNOSIS — Z20822 Contact with and (suspected) exposure to covid-19: Secondary | ICD-10-CM | POA: Diagnosis not present

## 2022-04-04 DIAGNOSIS — Z20822 Contact with and (suspected) exposure to covid-19: Secondary | ICD-10-CM | POA: Diagnosis not present

## 2022-06-01 ENCOUNTER — Emergency Department (HOSPITAL_COMMUNITY): Payer: Medicare Other

## 2022-06-01 ENCOUNTER — Other Ambulatory Visit: Payer: Self-pay

## 2022-06-01 ENCOUNTER — Encounter (HOSPITAL_COMMUNITY): Payer: Self-pay | Admitting: *Deleted

## 2022-06-01 ENCOUNTER — Inpatient Hospital Stay (HOSPITAL_COMMUNITY)
Admission: EM | Admit: 2022-06-01 | Discharge: 2022-06-09 | DRG: 202 | Disposition: A | Discharge status: Home / Self Care | Payer: Medicare Other | Attending: Family Medicine | Admitting: Family Medicine

## 2022-06-01 DIAGNOSIS — Z87891 Personal history of nicotine dependence: Secondary | ICD-10-CM

## 2022-06-01 DIAGNOSIS — J45901 Unspecified asthma with (acute) exacerbation: Principal | ICD-10-CM | POA: Diagnosis present

## 2022-06-01 DIAGNOSIS — R739 Hyperglycemia, unspecified: Secondary | ICD-10-CM | POA: Diagnosis not present

## 2022-06-01 DIAGNOSIS — J9601 Acute respiratory failure with hypoxia: Secondary | ICD-10-CM | POA: Diagnosis not present

## 2022-06-01 DIAGNOSIS — I10 Essential (primary) hypertension: Secondary | ICD-10-CM | POA: Diagnosis not present

## 2022-06-01 DIAGNOSIS — K219 Gastro-esophageal reflux disease without esophagitis: Secondary | ICD-10-CM | POA: Diagnosis present

## 2022-06-01 DIAGNOSIS — Z20822 Contact with and (suspected) exposure to covid-19: Secondary | ICD-10-CM | POA: Diagnosis present

## 2022-06-01 DIAGNOSIS — E876 Hypokalemia: Secondary | ICD-10-CM | POA: Diagnosis not present

## 2022-06-01 DIAGNOSIS — Z7951 Long term (current) use of inhaled steroids: Secondary | ICD-10-CM | POA: Diagnosis not present

## 2022-06-01 DIAGNOSIS — N179 Acute kidney failure, unspecified: Secondary | ICD-10-CM | POA: Diagnosis not present

## 2022-06-01 DIAGNOSIS — R0602 Shortness of breath: Secondary | ICD-10-CM | POA: Diagnosis not present

## 2022-06-01 DIAGNOSIS — I2693 Single subsegmental pulmonary embolism without acute cor pulmonale: Secondary | ICD-10-CM | POA: Diagnosis not present

## 2022-06-01 DIAGNOSIS — Z961 Presence of intraocular lens: Secondary | ICD-10-CM | POA: Diagnosis present

## 2022-06-01 DIAGNOSIS — E785 Hyperlipidemia, unspecified: Secondary | ICD-10-CM | POA: Diagnosis present

## 2022-06-01 DIAGNOSIS — I493 Ventricular premature depolarization: Secondary | ICD-10-CM | POA: Diagnosis not present

## 2022-06-01 DIAGNOSIS — T380X5A Adverse effect of glucocorticoids and synthetic analogues, initial encounter: Secondary | ICD-10-CM | POA: Diagnosis present

## 2022-06-01 DIAGNOSIS — I700 Atherosclerosis of aorta: Secondary | ICD-10-CM | POA: Diagnosis present

## 2022-06-01 DIAGNOSIS — D72829 Elevated white blood cell count, unspecified: Secondary | ICD-10-CM | POA: Diagnosis present

## 2022-06-01 DIAGNOSIS — Z9049 Acquired absence of other specified parts of digestive tract: Secondary | ICD-10-CM | POA: Diagnosis not present

## 2022-06-01 DIAGNOSIS — I2699 Other pulmonary embolism without acute cor pulmonale: Secondary | ICD-10-CM | POA: Diagnosis not present

## 2022-06-01 DIAGNOSIS — Z79899 Other long term (current) drug therapy: Secondary | ICD-10-CM

## 2022-06-01 DIAGNOSIS — Z23 Encounter for immunization: Secondary | ICD-10-CM

## 2022-06-01 DIAGNOSIS — R059 Cough, unspecified: Secondary | ICD-10-CM | POA: Diagnosis not present

## 2022-06-01 DIAGNOSIS — R778 Other specified abnormalities of plasma proteins: Secondary | ICD-10-CM | POA: Diagnosis present

## 2022-06-01 DIAGNOSIS — E782 Mixed hyperlipidemia: Secondary | ICD-10-CM | POA: Diagnosis not present

## 2022-06-01 DIAGNOSIS — J4541 Moderate persistent asthma with (acute) exacerbation: Secondary | ICD-10-CM | POA: Diagnosis not present

## 2022-06-01 DIAGNOSIS — J309 Allergic rhinitis, unspecified: Secondary | ICD-10-CM | POA: Diagnosis present

## 2022-06-01 DIAGNOSIS — R062 Wheezing: Secondary | ICD-10-CM | POA: Diagnosis not present

## 2022-06-01 DIAGNOSIS — R000 Tachycardia, unspecified: Secondary | ICD-10-CM | POA: Diagnosis not present

## 2022-06-01 DIAGNOSIS — D72823 Leukemoid reaction: Secondary | ICD-10-CM | POA: Diagnosis present

## 2022-06-01 DIAGNOSIS — R079 Chest pain, unspecified: Secondary | ICD-10-CM | POA: Diagnosis not present

## 2022-06-01 DIAGNOSIS — R0600 Dyspnea, unspecified: Secondary | ICD-10-CM | POA: Diagnosis not present

## 2022-06-01 DIAGNOSIS — R0689 Other abnormalities of breathing: Secondary | ICD-10-CM | POA: Diagnosis not present

## 2022-06-01 DIAGNOSIS — J4551 Severe persistent asthma with (acute) exacerbation: Secondary | ICD-10-CM | POA: Diagnosis not present

## 2022-06-01 DIAGNOSIS — R069 Unspecified abnormalities of breathing: Secondary | ICD-10-CM | POA: Diagnosis not present

## 2022-06-01 DIAGNOSIS — R7989 Other specified abnormal findings of blood chemistry: Secondary | ICD-10-CM | POA: Diagnosis present

## 2022-06-01 HISTORY — DX: Unspecified asthma, uncomplicated: J45.909

## 2022-06-01 LAB — COMPREHENSIVE METABOLIC PANEL
ALT: 28 U/L (ref 0–44)
AST: 36 U/L (ref 15–41)
Albumin: 4.6 g/dL (ref 3.5–5.0)
Alkaline Phosphatase: 45 U/L (ref 38–126)
Anion gap: 10 (ref 5–15)
BUN: 12 mg/dL (ref 8–23)
CO2: 27 mmol/L (ref 22–32)
Calcium: 9.5 mg/dL (ref 8.9–10.3)
Chloride: 101 mmol/L (ref 98–111)
Creatinine, Ser: 0.81 mg/dL (ref 0.44–1.00)
GFR, Estimated: >60 mL/min (ref >60)
Glucose, Bld: 167 mg/dL — ABNORMAL HIGH (ref 70–99)
Potassium: 3.7 mmol/L (ref 3.5–5.1)
Sodium: 138 mmol/L (ref 135–145)
Total Bilirubin: 0.7 mg/dL (ref 0.3–1.2)
Total Protein: 8.2 g/dL — ABNORMAL HIGH (ref 6.5–8.1)

## 2022-06-01 LAB — CBC WITH DIFFERENTIAL/PLATELET
Abs Immature Granulocytes: 0.11 10*3/uL — ABNORMAL HIGH (ref 0.00–0.07)
Basophils Absolute: 0.1 10*3/uL (ref 0.0–0.1)
Basophils Relative: 0 %
Eosinophils Absolute: 0.8 10*3/uL — ABNORMAL HIGH (ref 0.0–0.5)
Eosinophils Relative: 4 %
HCT: 44.4 % (ref 36.0–46.0)
Hemoglobin: 13.9 g/dL (ref 12.0–15.0)
Immature Granulocytes: 1 %
Lymphocytes Relative: 16 %
Lymphs Abs: 3.1 10*3/uL (ref 0.7–4.0)
MCH: 25.1 pg — ABNORMAL LOW (ref 26.0–34.0)
MCHC: 31.3 g/dL (ref 30.0–36.0)
MCV: 80.3 fL (ref 80.0–100.0)
Monocytes Absolute: 0.9 10*3/uL (ref 0.1–1.0)
Monocytes Relative: 5 %
Neutro Abs: 14 10*3/uL — ABNORMAL HIGH (ref 1.7–7.7)
Neutrophils Relative %: 74 %
Platelets: 364 10*3/uL (ref 150–400)
RBC: 5.53 MIL/uL — ABNORMAL HIGH (ref 3.87–5.11)
RDW: 14.3 % (ref 11.5–15.5)
WBC: 19 10*3/uL — ABNORMAL HIGH (ref 4.0–10.5)
nRBC: 0 % (ref 0.0–0.2)

## 2022-06-01 LAB — SARS CORONAVIRUS 2 BY RT PCR: SARS Coronavirus 2 by RT PCR: NEGATIVE

## 2022-06-01 LAB — BLOOD GAS, VENOUS
Acid-base deficit: 0.7 mmol/L (ref 0.0–2.0)
Bicarbonate: 24.2 mmol/L (ref 20.0–28.0)
Drawn by: 6000
O2 Saturation: 86.1 %
Patient temperature: 36.8
pCO2, Ven: 40 mmHg — ABNORMAL LOW (ref 44–60)
pH, Ven: 7.39 (ref 7.25–7.43)
pO2, Ven: 53 mmHg — ABNORMAL HIGH (ref 32–45)

## 2022-06-01 LAB — TROPONIN I (HIGH SENSITIVITY)
Troponin I (High Sensitivity): 48 ng/L — ABNORMAL HIGH (ref <18)
Troponin I (High Sensitivity): 270 ng/L (ref <18)
Troponin I (High Sensitivity): 847 ng/L (ref <18)

## 2022-06-01 LAB — BRAIN NATRIURETIC PEPTIDE: B Natriuretic Peptide: 93 pg/mL (ref 0.0–100.0)

## 2022-06-01 LAB — PROCALCITONIN: Procalcitonin: <0.1 ng/mL

## 2022-06-01 MED ORDER — ACETAMINOPHEN 650 MG RE SUPP: 650.0000 mg | Freq: Four times a day (QID) | RECTAL | Status: DC | PRN

## 2022-06-01 MED ORDER — ALBUTEROL SULFATE (2.5 MG/3ML) 0.083% IN NEBU
INHALATION_SOLUTION | RESPIRATORY_TRACT | Status: AC
  Administered 2022-06-01: 2.5 mg
  Filled 2022-06-01: qty 3

## 2022-06-01 MED ORDER — IPRATROPIUM-ALBUTEROL 0.5-2.5 (3) MG/3ML IN SOLN
3.0000 mL | Freq: Once | RESPIRATORY_TRACT | Status: AC
  Administered 2022-06-01: 3 mL via RESPIRATORY_TRACT
  Filled 2022-06-01: qty 3

## 2022-06-01 MED ORDER — GUAIFENESIN ER 600 MG PO TB12
600.0000 mg | ORAL_TABLET | Freq: Two times a day (BID) | ORAL | Status: DC
  Administered 2022-06-01: 600 mg via ORAL
  Filled 2022-06-01: qty 1

## 2022-06-01 MED ORDER — UMECLIDINIUM BROMIDE 62.5 MCG/ACT IN AEPB
1.0000 | INHALATION_SPRAY | Freq: Every day | RESPIRATORY_TRACT | Status: DC
  Administered 2022-06-02: 1 via RESPIRATORY_TRACT
  Filled 2022-06-01: qty 7

## 2022-06-01 MED ORDER — METHYLPREDNISOLONE SODIUM SUCC 125 MG IJ SOLR
125.0000 mg | Freq: Once | INTRAMUSCULAR | Status: DC
  Filled 2022-06-01: qty 2

## 2022-06-01 MED ORDER — MAGNESIUM SULFATE 2 GM/50ML IV SOLN
2.0000 g | Freq: Once | INTRAVENOUS | Status: AC
  Administered 2022-06-01: 2 g via INTRAVENOUS
  Filled 2022-06-01: qty 50

## 2022-06-01 MED ORDER — ALBUTEROL SULFATE (2.5 MG/3ML) 0.083% IN NEBU: 10.0000 mg/h | INHALATION_SOLUTION | Freq: Once | RESPIRATORY_TRACT | Status: DC

## 2022-06-01 MED ORDER — ALBUTEROL SULFATE (2.5 MG/3ML) 0.083% IN NEBU
INHALATION_SOLUTION | RESPIRATORY_TRACT | Status: AC
  Administered 2022-06-01: 10 mg/h via RESPIRATORY_TRACT
  Filled 2022-06-01: qty 12

## 2022-06-01 MED ORDER — CHLORHEXIDINE GLUCONATE CLOTH 2 % EX PADS
6.0000 | MEDICATED_PAD | Freq: Every day | CUTANEOUS | Status: DC
  Administered 2022-06-01 – 2022-06-05 (×4): 6 via TOPICAL

## 2022-06-01 MED ORDER — FLUTICASONE FUROATE-VILANTEROL 100-25 MCG/ACT IN AEPB
1.0000 | INHALATION_SPRAY | Freq: Every day | RESPIRATORY_TRACT | Status: DC
  Administered 2022-06-02: 1 via RESPIRATORY_TRACT
  Filled 2022-06-01: qty 28

## 2022-06-01 MED ORDER — ALBUTEROL SULFATE (2.5 MG/3ML) 0.083% IN NEBU
2.5000 mg | INHALATION_SOLUTION | RESPIRATORY_TRACT | Status: DC | PRN
  Administered 2022-06-02 – 2022-06-03 (×2): 2.5 mg via RESPIRATORY_TRACT
  Filled 2022-06-01 (×2): qty 3

## 2022-06-01 MED ORDER — ROSUVASTATIN CALCIUM 10 MG PO TABS
5.0000 mg | ORAL_TABLET | ORAL | Status: DC
  Administered 2022-06-02 – 2022-06-09 (×3): 5 mg via ORAL
  Filled 2022-06-01 (×4): qty 1

## 2022-06-01 MED ORDER — HEPARIN (PORCINE) 25000 UT/250ML-% IV SOLN
850.0000 [IU]/h | INTRAVENOUS | Status: DC
  Administered 2022-06-01: 1200 [IU]/h via INTRAVENOUS
  Administered 2022-06-03: 850 [IU]/h via INTRAVENOUS
  Filled 2022-06-01 (×3): qty 250

## 2022-06-01 MED ORDER — IPRATROPIUM-ALBUTEROL 0.5-2.5 (3) MG/3ML IN SOLN
3.0000 mL | Freq: Four times a day (QID) | RESPIRATORY_TRACT | Status: DC
  Administered 2022-06-02: 3 mL via RESPIRATORY_TRACT
  Filled 2022-06-01: qty 3

## 2022-06-01 MED ORDER — OXYCODONE HCL 5 MG PO TABS: 5.0000 mg | ORAL_TABLET | ORAL | Status: DC | PRN

## 2022-06-01 MED ORDER — MORPHINE SULFATE (PF) 2 MG/ML IV SOLN: 2.0000 mg | INTRAVENOUS | Status: DC | PRN

## 2022-06-01 MED ORDER — LOSARTAN POTASSIUM 50 MG PO TABS
100.0000 mg | ORAL_TABLET | Freq: Every day | ORAL | Status: DC
Start: 2022-06-01 — End: 2022-06-03
  Administered 2022-06-01 – 2022-06-02 (×2): 100 mg via ORAL
  Filled 2022-06-01 (×2): qty 2

## 2022-06-01 MED ORDER — PREDNISONE 20 MG PO TABS: 40.0000 mg | ORAL_TABLET | Freq: Every day | ORAL | Status: DC

## 2022-06-01 MED ORDER — ALBUTEROL SULFATE (2.5 MG/3ML) 0.083% IN NEBU: 10.0000 mg/h | INHALATION_SOLUTION | RESPIRATORY_TRACT | Status: AC

## 2022-06-01 MED ORDER — IOHEXOL 350 MG/ML SOLN
100.0000 mL | Freq: Once | INTRAVENOUS | Status: AC | PRN
Start: 2022-06-01 — End: 2022-06-01
  Administered 2022-06-01: 80 mL via INTRAVENOUS

## 2022-06-01 MED ORDER — SODIUM CHLORIDE 0.9 % IV BOLUS
500.0000 mL | Freq: Once | INTRAVENOUS | Status: AC
  Administered 2022-06-01: 500 mL via INTRAVENOUS

## 2022-06-01 MED ORDER — BENZONATATE 100 MG PO CAPS
200.0000 mg | ORAL_CAPSULE | Freq: Once | ORAL | Status: AC
Start: 2022-06-01 — End: 2022-06-01
  Administered 2022-06-01: 200 mg via ORAL
  Filled 2022-06-01: qty 2

## 2022-06-01 MED ORDER — LORATADINE 10 MG PO TABS
10.0000 mg | ORAL_TABLET | Freq: Every day | ORAL | Status: DC
  Administered 2022-06-02 – 2022-06-09 (×8): 10 mg via ORAL
  Filled 2022-06-01 (×8): qty 1

## 2022-06-01 MED ORDER — METHYLPREDNISOLONE SODIUM SUCC 125 MG IJ SOLR
125.0000 mg | Freq: Two times a day (BID) | INTRAMUSCULAR | Status: DC
  Administered 2022-06-01: 125 mg via INTRAVENOUS
  Filled 2022-06-01: qty 2

## 2022-06-01 MED ORDER — ONDANSETRON HCL 4 MG/2ML IJ SOLN: 4.0000 mg | Freq: Four times a day (QID) | INTRAMUSCULAR | Status: DC | PRN

## 2022-06-01 MED ORDER — MELATONIN 3 MG PO TABS
6.0000 mg | ORAL_TABLET | Freq: Every day | ORAL | Status: DC
  Administered 2022-06-01 – 2022-06-08 (×8): 6 mg via ORAL
  Filled 2022-06-01 (×8): qty 2

## 2022-06-01 MED ORDER — ACETAMINOPHEN 325 MG PO TABS: 650.0000 mg | ORAL_TABLET | Freq: Four times a day (QID) | ORAL | Status: DC | PRN

## 2022-06-01 MED ORDER — HYDROCHLOROTHIAZIDE 25 MG PO TABS
25.0000 mg | ORAL_TABLET | Freq: Every day | ORAL | Status: DC
  Administered 2022-06-01 – 2022-06-02 (×2): 25 mg via ORAL
  Filled 2022-06-01 (×2): qty 1

## 2022-06-01 MED ORDER — IPRATROPIUM BROMIDE 0.02 % IN SOLN
RESPIRATORY_TRACT | Status: AC
  Administered 2022-06-01: 0.5 mg
  Filled 2022-06-01: qty 2.5

## 2022-06-01 MED ORDER — ALBUTEROL SULFATE (2.5 MG/3ML) 0.083% IN NEBU
2.5000 mg | INHALATION_SOLUTION | Freq: Once | RESPIRATORY_TRACT | Status: AC
  Administered 2022-06-01: 2.5 mg via RESPIRATORY_TRACT
  Filled 2022-06-01: qty 3

## 2022-06-01 MED ORDER — LOSARTAN POTASSIUM-HCTZ 100-25 MG PO TABS: 1.0000 | ORAL_TABLET | Freq: Every day | ORAL | Status: DC

## 2022-06-01 MED ORDER — ALBUTEROL SULFATE (2.5 MG/3ML) 0.083% IN NEBU
10.0000 mg | INHALATION_SOLUTION | Freq: Once | RESPIRATORY_TRACT | Status: AC
Start: 2022-06-01 — End: 2022-06-01
  Administered 2022-06-01: 10 mg via RESPIRATORY_TRACT
  Filled 2022-06-01: qty 12

## 2022-06-01 MED ORDER — ONDANSETRON HCL 4 MG PO TABS: 4.0000 mg | ORAL_TABLET | Freq: Four times a day (QID) | ORAL | Status: DC | PRN

## 2022-06-01 MED ORDER — ASPIRIN 81 MG PO CHEW
324.0000 mg | CHEWABLE_TABLET | Freq: Once | ORAL | Status: AC
Start: 2022-06-01 — End: 2022-06-01
  Administered 2022-06-01: 324 mg via ORAL
  Filled 2022-06-01: qty 4

## 2022-06-01 MED ORDER — HEPARIN BOLUS VIA INFUSION
4500.0000 [IU] | Freq: Once | INTRAVENOUS | Status: AC
  Administered 2022-06-01: 4500 [IU] via INTRAVENOUS

## 2022-06-01 NOTE — Assessment & Plan Note (Addendum)
-   Wheezing, cough, severe SOB has been slowly getting better.   -COVID negative.  Resp panel negative.  -Only new exposure in the home [place] -Patient is not a smoker -Chest x-ray shows no active disease -There is a leukocytosis, patient does have a cough, but has been afebrile with no sick contacts-check a procalcitonin which was <0.10 and reassuring -Continue steroids, nebs, mucolytics, singulair, cough suppressants -Continue to monitor in stepdown ICU

## 2022-06-01 NOTE — ED Provider Notes (Signed)
MSE note.  Patient complains of shortness of breath.  This been going on for a few days and she was seen by paramedics who gave her a neb treatment.  No fevers no chills.  Physical exam patient having wheezing all throughout.  A lot of the wheezing seems to be more upper airway.  Patient is being treated for bronchospasm labs i and x-rays 10 x-rays pending   Milton Ferguson, MD 06/01/22 1447

## 2022-06-01 NOTE — Assessment & Plan Note (Addendum)
-  secondary to severe asthma exacerbation -Patient has no oxygen requirement at baseline -wean oxygen as able - consulted to [facility] pulmonologist Dr. Sherene Sires for assistance with diagnosis and mgmt.  -BiPAP ordered if needed -Continue Singulair, steroids, bronchodilators -With the wheezing, and seems to be asthma exacerbation -Troponin elevation likely demand ischemia -PE has been ruled out.  V/Q scan with no evidence of PE.  2nd CTA chest no finding of PE.  -COVID negative -respiratory pathogen panel negative  -Dr. Craige Cotta changed her over to brovana, yupelri and pulmicort and she is much improved

## 2022-06-01 NOTE — Assessment & Plan Note (Addendum)
-   White blood cell count remains elevated but is coming down -leukemoid reaction from high dose steroids -No active disease on chest x-ray -COVID negative -Procalcitonin <0.10

## 2022-06-01 NOTE — Assessment & Plan Note (Signed)
Continue Crestor 

## 2022-06-01 NOTE — Progress Notes (Signed)
Patient still hasn't been fully ruled out for having a PE; in which BIPAP would be contraindicated. Discussed this with MD. We agreed to do the continuous neb on patient then place on HFNC salter at 6 lpm. If patient's WOB doesn't improve we will try the heated HFNC and then BIPAP.

## 2022-06-01 NOTE — ED Provider Notes (Signed)
[facility] EMERGENCY DEPARTMENT Provider Note   CSN: [id number] Arrival date & time: 06/01/22  1428     History  Chief Complaint  Patient presents with   Shortness of Breath    Tammy [name] is a 76 y.o. female.   Shortness of Breath   Patient presents to the emergency room with complaints of shortness of breath.  Patient states she has a history of asthma but no other lung problems.  She started noticing some shortness of breath a few days ago.  It has been gradually getting worse until today when it was severe.  Patient difficulty catching her breath and even speaking more than a few words at a time without getting short of breath she has not noticed any leg swelling.  No fevers.  No chest pain.  Patient called EMS and was given 5 mg of albuterol and Solu-Medrol prior to arrival.  Her symptoms continued despite the  Home Medications Prior to Admission medications   Medication Sig Start Date End Date Taking? Authorizing Provider  albuterol (VENTOLIN HFA) 108 (90 Base) MCG/ACT inhaler Inhale into the lungs. 06/01/22  Yes [provider]  cetirizine (ZYRTEC) 10 MG tablet Take 10 mg by mouth daily.   Yes [provider]  diclofenac Sodium (VOLTAREN) 1 % GEL Apply 2 g topically 4 (four) times daily. 03/12/22  Yes [provider]  fluticasone (FLONASE) 50 MCG/ACT nasal spray Place into both nostrils. 03/03/22  Yes [provider]  losartan-hydrochlorothiazide (HYZAAR) 100-25 MG tablet Take 1 tablet by mouth daily. 10/28/17  Yes [provider]  montelukast (SINGULAIR) 10 MG tablet Take 10 mg by mouth daily. 05/21/22  Yes [provider]  Probiotic Product (TRUBIOTICS PO) Take 1 capsule by mouth daily.   Yes [provider]  rosuvastatin (CRESTOR) 5 MG tablet Take 5 mg by mouth 2 (two) times a week. Sundays & Thursdays   Yes [provider]  acetaminophen (TYLENOL) 500 MG tablet Take 500 mg by mouth daily as needed for  mild pain (for pain).    [provider]  TRELEGY ELLIPTA 100-62.5-25 MCG/ACT AEPB Inhale 1 puff into the lungs daily. 06/01/22   [provider]      Allergies    Patient has no known allergies.    Review of Systems   Review of Systems  Respiratory:  Positive for shortness of breath.     Physical Exam Updated Vital Signs BP 115/89   Pulse (!) 109   Temp 98.2 F (36.8 C) (Oral)   Resp (!) 25   Ht 1.651 m ('5\' 5"'$ )   Wt 79.4 kg   SpO2 100%   BMI 29.12 kg/m  Physical Exam Vitals and nursing note reviewed.  Constitutional:      Appearance: She is well-developed. She is ill-appearing.  HENT:     Head: Normocephalic and atraumatic.     Right Ear: External ear normal.     Left Ear: External ear normal.  Eyes:     General: No scleral icterus.       Right eye: No discharge.        Left eye: No discharge.     Conjunctiva/sclera: Conjunctivae normal.  Neck:     Trachea: No tracheal deviation.  Cardiovascular:     Rate and Rhythm: Normal rate and regular rhythm.  Pulmonary:     Effort: Tachypnea and accessory muscle usage present. No respiratory distress.     Breath sounds: No stridor. Wheezing present. No rales.  Abdominal:     General: Bowel sounds are normal. There is no distension.     Palpations: Abdomen is soft.     Tenderness: There is no abdominal tenderness. There is no guarding or rebound.  Musculoskeletal:        General: No tenderness or deformity.     Cervical back: Neck supple.     Right lower leg: No edema.     Left lower leg: No edema.  Skin:    General: Skin is warm and dry.     Findings: No rash.  Neurological:     General: No focal deficit present.     Mental Status: She is alert.     Cranial Nerves: No cranial nerve deficit (no facial droop, extraocular movements intact, no slurred speech).     Sensory: No sensory deficit.     Motor: No abnormal muscle tone or seizure activity.     Coordination: Coordination normal.  Psychiatric:         Mood and Affect: Mood normal.     ED Results / Procedures / Treatments   Labs (all labs ordered are listed, but only abnormal results are displayed) Labs Reviewed  CBC WITH DIFFERENTIAL/PLATELET - Abnormal; Notable for the following components:      Result Value   WBC 19.0 (*)    RBC 5.53 (*)    MCH 25.1 (*)    Neutro Abs 14.0 (*)    Eosinophils Absolute 0.8 (*)    Abs Immature Granulocytes 0.11 (*)    All other components within normal limits  COMPREHENSIVE METABOLIC PANEL - Abnormal; Notable for the following components:   Glucose, Bld 167 (*)    Total Protein 8.2 (*)    All other components within normal limits  TROPONIN I (HIGH SENSITIVITY) - Abnormal; Notable for the following components:   Troponin I (High Sensitivity) 48 (*)    All other components within normal limits  TROPONIN I (HIGH SENSITIVITY) - Abnormal; Notable for the following components:   Troponin I (High Sensitivity) 270 (*)    All other components within normal limits  SARS CORONAVIRUS 2 BY RT PCR  BRAIN NATRIURETIC PEPTIDE  HEPARIN LEVEL (UNFRACTIONATED)  CBC  BLOOD GAS, VENOUS  PROCALCITONIN  TROPONIN I (HIGH SENSITIVITY)    EKG EKG Interpretation  Date/Time:  Wednesday June 01 2022 19:48:29 EDT Ventricular Rate:  107 PR Interval:  172 QRS Duration: 99 QT Interval:  349 QTC Calculation: 466 R Axis:   29 Text Interpretation: Sinus tachycardia Atrial premature complex Anterior infarct, old Since last tracing rate faster Confirmed by Dorie Rank [phone]) on 06/01/2022 7:51:32 PM  Radiology CT Angio Chest PE W and/or Wo Contrast  Result Date: 06/01/2022 CLINICAL DATA:  Shortness of breath, concern for pulmonary embolism. EXAM: CT ANGIOGRAPHY CHEST WITH CONTRAST TECHNIQUE: Multidetector CT imaging of the chest was performed using the standard protocol during bolus administration of intravenous contrast. Multiplanar CT image reconstructions and MIPs were obtained to evaluate the vascular anatomy.  RADIATION DOSE REDUCTION: This exam was performed according to the departmental dose-optimization program which includes automated exposure control, adjustment of the mA and/or kV according to patient size and/or use of iterative reconstruction technique. CONTRAST:  72m OMNIPAQUE IOHEXOL 350 MG/ML SOLN COMPARISON:  Chest radiograph performed the same day. FINDINGS: Cardiovascular: Satisfactory opacification of the pulmonary arteries to the segmental level. There is a questionable filling defect in a subsegmental pulmonary artery supplying the left lower lobe (series 5, image [id number]). Vascular calcifications are seen in  the aortic arch. Normal heart size. No pericardial effusion. Mediastinum/Nodes: No enlarged mediastinal, hilar, or axillary lymph nodes. Thyroid gland, trachea, and esophagus demonstrate no significant findings. Lungs/Pleura: Lungs are clear. No pleural effusion or pneumothorax. Upper Abdomen: No acute abnormality. Musculoskeletal: Degenerative changes are seen in the spine. Review of the MIP images confirms the above findings. IMPRESSION: 1. Questionable small filling defect in a subsegmental pulmonary artery supplying the left lower lobe may represent a pulmonary embolism. If it would help direct clinical management, a follow-up CT PE protocol could be performed in 24 hours to confirm the presence/absence of this finding. Aortic Atherosclerosis (ICD10-I70.0). Electronically Signed   By: Zerita Boers M.D.   On: 06/01/2022 18:44   DG Chest Port 1 View  Result Date: 06/01/2022 CLINICAL DATA:  Shortness of breath EXAM: PORTABLE CHEST 1 VIEW COMPARISON:  None Available. FINDINGS: The heart size and mediastinal contours are within normal limits. Both lungs are clear. Degenerative joint changes of bilateral shoulders are noted. Scoliosis of spine noted. IMPRESSION: No active disease. Electronically Signed   By: Abelardo Diesel M.D.   On: 06/01/2022 15:32    Procedures Procedures    Medications  Ordered in ED Medications  albuterol (PROVENTIL) (2.5 MG/3ML) 0.083% nebulizer solution (0 mg/hr Nebulization Stopped 06/01/22 1936)  heparin ADULT infusion 100 units/mL (25000 units/254m) (1,200 Units/hr Intravenous New Bag/Given 06/01/22 1935)  magnesium sulfate IVPB 2 g 50 mL (0 g Intravenous Stopped 06/01/22 1557)  ipratropium-albuterol (DUONEB) 0.5-2.5 (3) MG/3ML nebulizer solution 3 mL (3 mLs Nebulization Given 06/01/22 1450)  albuterol (PROVENTIL) (2.5 MG/3ML) 0.083% nebulizer solution 2.5 mg (2.5 mg Nebulization Given 06/01/22 1450)  aspirin chewable tablet 324 mg (324 mg Oral Given 06/01/22 1800)  iohexol (OMNIPAQUE) 350 MG/ML injection 100 mL (80 mLs Intravenous Contrast Given 06/01/22 1821)  albuterol (PROVENTIL) (2.5 MG/3ML) 0.083% nebulizer solution (2.5 mg  Given 06/01/22 1852)  heparin bolus via infusion 4,500 Units (4,500 Units Intravenous Bolus from Bag 06/01/22 1941)    ED Course/ Medical Decision Making/ A&P Clinical Course as of 06/01/22 2003  Wed Jun 01, 2022  1633 Troponin I (High Sensitivity)(!) Trop elevated, will check delta.  Could be related to the tachycardia that she had earlier [JK]  1633 CBC with Differential(!) Wbc elevated [JK]  1633 Comprehensive metabolic panel(!) Nl  [JK]  1633 DG Chest Port 1 View Chest x-ray images and radiology report reviewed.  No acute findings [JK]  1654 Patient's work of breathing is improving.  Able to speak more easily now [JK]  1742 Troponin increased from 48 to 270.  With her shortness of breath will CT to rule out PE. [JK]  1913 CT with possible pe, not definitive.  Will start on heparin  [JK]  1951 Case discussed with Dr. MDomenic Politecardiology.  Recommends echocardiogram in the morning.  Cardiology will also see the patient in the morning.  Feels patient is appropriate to stay here unless she starts having acute chest pain, worsening symptoms [JK]    Clinical Course User Index [JK] KDorie Rank MD                           Medical  Decision Making Differential diagnosis includes but not limited to, pneumonia, pneumothorax, CHF, asthma exacerbation with bronchospasm  Problems Addressed: Elevated troponin: acute illness or injury that poses a threat to life or bodily functions Severe asthma with exacerbation, unspecified whether persistent: acute illness or injury that poses a threat to life or  bodily functions Single subsegmental pulmonary embolism without acute cor pulmonale ([place]): acute illness or injury  Amount and/or Complexity of Data Reviewed Labs: ordered. Decision-making details documented in ED Course. Radiology: ordered and independent interpretation performed. Decision-making details documented in ED Course.    Details: No acute infiltrate noted Discussion of management or test interpretation with external provider(s): Case discussed with Dr. Domenic Polite cardiology and Dr. Clearence Ped hospitalist  Risk OTC drugs. Prescription drug management. Drug therapy requiring intensive monitoring for toxicity. Decision regarding hospitalization.   Patient presented to the ED with acute shortness of breath.  Patient had significant wheezing noted on exam.  She had increased work of breathing and difficulty speaking in full sentences.  Patient was treated with multiple breathing treatment steroids magnesium with significant improvement in her symptoms.  Did consider the possibility of cardiac etiologies for her breathing so BMP troponins were ordered.  BMP is normal.  No findings to suggest pulmonary edema on her chest x-ray however her troponin was elevated.  Demand ischemia is a possibility however her second troponin is more elevated.  EKG does not show suggesting acute ischemia.  CT angio chest ordered to rule out large PE causing cardiac injury and the CT scan is inconclusive.  Suggest possible PE but radiology recommends a repeat scan in the morning as the scan is not definitive.  Patient has been started on  anticoagulation.  I have spoken with cardiology and the hospitalist service.  We will plan on admission to the hospital here.  Echocardiogram the morning.  Cardiology consultation in addition to the hospitalist evaluation.  Clinically the patient has improved significantly with her treatments.  She is feeling much better.        Final Clinical Impression(s) / ED Diagnoses Final diagnoses:  Severe asthma with exacerbation, unspecified whether persistent  Elevated troponin  Single subsegmental pulmonary embolism without acute cor pulmonale [facility])    Rx / DC Orders ED Discharge Orders     None         Dorie Rank, MD 06/01/22 2003

## 2022-06-01 NOTE — Assessment & Plan Note (Addendum)
-   Patient is on an ARB and HCTZ at baseline which is being held due to soft BP and bump in creatinine -Systolic blood pressure maintaining in the low 100s at this time, holding home medication -Continue to monitor; so far BPs have been stable

## 2022-06-01 NOTE — Assessment & Plan Note (Addendum)
-   Presumed PE with possible subsegmental PE described on initial CTA chest -radiologist recommended repeat study in 24 hours for confirmation but repeat CT did not show PE due to timing of contrast administration.   Ultrasound bilateral LE negative for DVT.  -Ultrasound DVT negative for clot in legs. -V/Q scan done on 7/8 with no evidence of PE also reassuring that there is no PE -Echo reassuring : no heart strain noted -DC heparin/apixaban and full anticoagulation at this point as we have ruled out PE

## 2022-06-01 NOTE — ED Triage Notes (Signed)
Pt brought in by [facility] for c/o sob; pt states she has been having intermittent sob x 4 days  Pt was given albuterol '5mg'$ , '125mg'$  solumedrol en route by ems  Pt using accessory muscle and c/o some chest discomfort

## 2022-06-01 NOTE — Assessment & Plan Note (Addendum)
-   Troponin elevation likely demand ischemia in setting of respiratory distress (acute) -Continue to cycle troponins -EKG shows sinus tachycardia with a rate of 107, QTc 466, there is baseline artifact but appears to be some ST depression in the inferior leads -Denying chest pain at this time -Cardiology consulted -This is likely demand ischemia in the setting of acute hypoxic respiratory failure with possible PE -Repeat EKG for any new episodes of chest pain -Continue to monitor -completing 48 hours of IV heparin infusion

## 2022-06-01 NOTE — ED Notes (Addendum)
Notifed RT that pt is back in room. RT planning to come and assess pt

## 2022-06-01 NOTE — ED Notes (Signed)
Called RT and gave update on pt. RT will come to assess after pt gets back from CT

## 2022-06-01 NOTE — Progress Notes (Signed)
ANTICOAGULATION CONSULT NOTE - Initial Consult  Pharmacy Consult for heparin Indication: pulmonary embolus  No Known Allergies  Patient Measurements: Height: '5\' 5"'$  (165.1 cm) Weight: 79.4 kg (175 lb) IBW/kg (Calculated) : 57 Heparin Dosing Weight: 74 kg  Vital Signs: Temp: 98.2 F (36.8 C) (07/05 1439) Temp Source: Oral (07/05 1439) BP: 144/74 (07/05 1915) Pulse Rate: 108 (07/05 1915)  Labs: Recent Labs    06/01/22 1514 06/01/22 1650  HGB 13.9  --   HCT 44.4  --   PLT 364  --   CREATININE 0.81  --   TROPONINIHS 48* 270*    Estimated Creatinine Clearance: 59.6 mL/min (by C-G formula based on SCr of 0.81 mg/dL).   Medical History: Past Medical History:  Diagnosis Date   Hypertension     Medications:  (Not in a hospital admission)   Assessment: Pharmacy consulted to dose heparin in patient with possible pulmonary embolism.  Patient is not on anticoagulation prior to admission.  CBC WNL  Goal of Therapy:  Heparin level 0.3-0.7 units/ml Monitor platelets by anticoagulation protocol: Yes   Plan:  Give 4500 units bolus x 1 Start heparin infusion at 1200 units/hr Check anti-Xa level in 8 hours and daily while on heparin Continue to monitor H&H and platelets  Ramond Craver 06/01/2022,7:26 PM

## 2022-06-01 NOTE — H&P (Signed)
History and Physical    Patient: Tammy [name] NWG:[id number] DOB: 1945/12/26 DOA: 06/01/2022 DOS: the patient was seen and examined on 06/01/2022 PCP: Celene Squibb, MD  Patient coming from: Home  Chief Complaint:  Chief Complaint  Patient presents with   Shortness of Breath   HPI: Tammy [name] is a 76 y.o. female with medical history significant of hypertension and controlled asthma who presents to ED with a chief complaint of dyspnea.  Patient reportedly was quite short of breath that hesitation, only able to speak in 2-3 word phrases, and tripoding.  At the time of my exam patient is no longer tripoding, but still cannot complete a sentence without having to close her eyes and focus on breathing.  She has an audible wheeze from the doorway.  Due to her work of breathing, history is quite limited.  She reports that her dyspnea was gradual in onset and progressively worsening since last night.  She was not able to lay flat at all last night.  She has not noticed any swelling in her legs.  She has no history of CHF/fluid overload.  Her dyspnea was also worse on exertion.  It became acutely worse in the middle of the night last night.  She denies chest pain.  Her sister, at bedside, provides some extra history.  She reports that patient has recently had family members staying in her home from [place] places including [place], [place], [place].  They are the only new exposures in her home.  Patient has no pets, no new laundry detergent, perfumes, etc.  Patient has had no recent history of travel/long car, plane, train rides.  She has had no hemoptysis.  She has not been hospitalized for asthma in the past.  She does not wear oxygen at home.  Patient denies any sick contacts.  She was vaccinated for COVID.  She has been afebrile.  We clarified, patient is full code including intubation.  Her son would be her decision-maker.  He will be at bedside tomorrow.  Further questioning deferred until patient's  breathing is under control.   Review of Systems: unable to review all systems due to the inability of the patient to answer questions. Past Medical History:  Diagnosis Date   Hypertension    Past Surgical History:  Procedure Laterality Date   ABDOMINAL HYSTERECTOMY  1988   CATARACT EXTRACTION W/PHACO Right 09/27/2019   Procedure: CATARACT EXTRACTION PHACO AND INTRAOCULAR LENS PLACEMENT (IOC) (CDE: 4.94);  Surgeon: Baruch Goldmann, MD;  Location: [facility];  Service: Ophthalmology;  Laterality: Right;   CATARACT EXTRACTION W/PHACO Left 10/18/2019   Procedure: CATARACT EXTRACTION PHACO AND INTRAOCULAR LENS PLACEMENT (IOC);  Surgeon: Baruch Goldmann, MD;  Location: [facility];  Service: Ophthalmology;  Laterality: Left;  CDE: 7.18   COLONOSCOPY N/A 08/11/2016   Procedure: COLONOSCOPY;  Surgeon: Rogene Houston, MD;  Location: [facility];  Service: Endoscopy;  Laterality: N/A;  730   ELBOW FRACTURE SURGERY     KNEE ARTHROSCOPY     SHOULDER OPEN ROTATOR CUFF REPAIR Right 10/06/2016   Procedure: OPEN ROTATOR CUFF REPAIR RIGHT SHOULDER;  Surgeon: Carole Civil, MD;  Location: [facility];  Service: Orthopedics;  Laterality: Right;   Social History:  reports that she quit smoking about 38 years ago. Her smoking use included cigarettes. She has a 11.25 pack-year smoking history. She has never used smokeless tobacco. She reports that she does not drink alcohol and does not use drugs.  No Known Allergies  Family History  Problem Relation Age of Onset   Cancer Maternal Grandmother     Prior to Admission medications   Medication Sig Start Date End Date Taking? Authorizing Provider  albuterol (VENTOLIN HFA) 108 (90 Base) MCG/ACT inhaler Inhale into the lungs. 06/01/22  Yes [provider]  cetirizine (ZYRTEC) 10 MG tablet Take 10 mg by mouth daily.   Yes [provider]  diclofenac Sodium (VOLTAREN) 1 % GEL Apply 2 g topically 4 (four) times daily. 03/12/22  Yes [provider]   fluticasone (FLONASE) 50 MCG/ACT nasal spray Place into both nostrils. 03/03/22  Yes [provider]  losartan-hydrochlorothiazide (HYZAAR) 100-25 MG tablet Take 1 tablet by mouth daily. 10/28/17  Yes [provider]  montelukast (SINGULAIR) 10 MG tablet Take 10 mg by mouth daily. 05/21/22  Yes [provider]  Probiotic Product (TRUBIOTICS PO) Take 1 capsule by mouth daily.   Yes [provider]  rosuvastatin (CRESTOR) 5 MG tablet Take 5 mg by mouth 2 (two) times a week. Sundays & Thursdays   Yes [provider]  acetaminophen (TYLENOL) 500 MG tablet Take 500 mg by mouth daily as needed for mild pain (for pain).    [provider]  TRELEGY ELLIPTA 100-62.5-25 MCG/ACT AEPB Inhale 1 puff into the lungs daily. 06/01/22   [provider]    Physical Exam: Vitals:   06/01/22 2000 06/01/22 2016 06/01/22 2031 06/01/22 2032  BP: 115/89 104/83 107/63 95/71  Pulse: (!) 109 (!) 103 100 (!) 102  Resp: (!) 25 (!) 28 (!) 23 20  Temp:      TempSrc:      SpO2: 100% 100% 100% 100%  Weight:      Height:       1.  General: Patient lying supine in bed, with visible dyspnea   2. Psychiatric: Alert and oriented x 3, mood and behavior normal for situation, pleasant and cooperative with exam   3. Neurologic: Speech and language are normal, face is symmetric, moves all 4 extremities voluntarily, at baseline without acute deficits on limited exam   4. HEENMT:  Head is atraumatic, normocephalic, pupils reactive to light, neck is supple, trachea is midline, mucous membranes are moist   5. Respiratory : With diffuse wheezing, diminished in the lower lung fields, no rales, rhonchi, cyanosis.  Increased work of breathing with tachypnea and accessory muscle use.   6. Cardiovascular : Heart rate tachycardic, rhythm is regular, no murmurs, rubs or gallops, no peripheral edema, peripheral pulses palpated   7. Gastrointestinal:  Abdomen is soft,  nondistended, nontender to palpation bowel sounds active, no masses or organomegaly palpated   8. Skin:  Skin is warm, dry and intact without rashes, acute lesions, or ulcers on limited exam   9.Musculoskeletal:  No acute deformities or trauma, no asymmetry in tone, no peripheral edema, peripheral pulses palpated, no tenderness to palpation in the extremities  Data Reviewed: With EMS Patient was given albuterol 5 mg, 125 mg Solu-Medrol  In the ED Temp 98.2, heart rate 103-124, respiratory rate 22-33, blood pressure 96/54-159/120, satting at 100% on 3 L nasal cannula Leukocytosis at 19.0, hemoglobin 13.9, platelets 364 Hyperglycemia 167 without a diagnosis of diabetes-could be related to steroids -if it continues to be elevated on morning chemistry consider adding sliding scale and regular CBGs Troponin elevation from 48-270 Cardiology consult recommends echo in the a.m. and they will see patient in the a.m. CTA shows a possible subsegmental PE in the left lower lobe  recommends repeat in 24 hours to confirm Chest x-ray shows no active disease Heparin drip started Albuterol, aspirin, DuoNeb given in the ED Mag sulfate given in the ED Admission requested for acute respiratory failure with hypoxia  Assessment and Plan: * Acute respiratory failure with hypoxia ([place]) - Patient has no oxygen requirement at baseline -Currently requiring 3 L nasal cannula to maintain oxygen sats and still has increased work of breathing -BiPAP ordered for work of breathing -Another continuous neb ordered in addition to as needed albuterol and scheduled DuoNeb -Continue Singulair, Trelegy, steroids -With the wheezing, and seems to be asthma exacerbation, but unclear etiology of the asthma exacerbation -VBG pending -Troponin elevation from 48-270, continue cycle troponins, echo in the a.m., cardiology to see in the a.m. -Possible PE on CTA, continue heparin -COVID pending -Continue to monitor  Elevated  troponin - Troponin elevation from 48-270 -Continue to cycle troponins -EKG shows sinus tachycardia with a rate of 107, QTc 466, there is baseline artifact but appears to be some ST depression in the inferior leads -Denying chest pain at this time -Cardiology consulted and will see patient in the a.m. recommends echo in the a.m. -This is likely demand ischemia in the setting of acute hypoxic respiratory failure with possible PE -Repeat EKG for any new episodes of chest pain -Continue to monitor  Asthma, chronic, unspecified asthma severity, with acute exacerbation - Wheezing, patient feels symptomatically improved after albuterol- but the improvement is only temporary -COVID pending -Only new exposure in the home with recent visitors from out of the country -Patient is not a smoker -Chest x-ray shows no active disease -There is a leukocytosis, patient does have a cough, but has been afebrile with no sick contacts-check a procalcitonin -Continue as needed albuterol, scheduled DuoNeb, steroids, BiPAP -Continue to monitor  Hyperlipidemia - Continue Crestor  Leukocytosis - White blood cell count 19.0 -Acute phase reaction in the setting of acute respiratory failure with hypoxia? -No active disease on chest x-ray -Afebrile, but cough is present -COVID pending -Procalcitonin pending -No urinary symptoms -Likely to remain elevated with steroid administration, but will trend in the a.m.  Acute pulmonary embolism (HCC) - Presumed PE with possible subsegmental PE described on CTA -CTA recommends repeat in 24 hours for confirmation -this order has not been placed.  If ultrasound bilateral DVT is positive in the a.m. it may be possible to avoid second radiation exposure with repeat CTA -Ultrasound DVT ordered for a.m. -Echo ordered for a.m. to assess heart strain -Continue heparin drip -Continue to monitor  Essential hypertension - Patient is on an ARB and HCTZ at baseline -Systolic  blood pressure maintaining in the low 100s at this time, holding home medication -Continue to monitor      Advance Care Planning:   Code Status: Prior full  Consults: Cardiology  Family Communication: Sister at bedside  Severity of Illness: The appropriate patient status for this patient is INPATIENT. Inpatient status is judged to be reasonable and necessary in order to provide the required intensity of service to ensure the patient's safety. The patient's presenting symptoms, physical exam findings, and initial radiographic and laboratory data in the context of their chronic comorbidities is felt to place them at high risk for further clinical deterioration. Furthermore, it is not anticipated that the patient will be medically stable for discharge from the hospital within 2 midnights of admission.   * I certify that at the point of admission it is my clinical judgment that the patient will require  inpatient hospital care spanning beyond 2 midnights from the point of admission due to high intensity of service, high risk for further deterioration and high frequency of surveillance required.*  Author: Rolla Plate, DO 06/01/2022 8:42 PM  For on call review [email or web address].

## 2022-06-02 ENCOUNTER — Inpatient Hospital Stay (HOSPITAL_COMMUNITY): Payer: Medicare Other

## 2022-06-02 ENCOUNTER — Encounter (HOSPITAL_COMMUNITY): Payer: Self-pay | Admitting: Family Medicine

## 2022-06-02 DIAGNOSIS — I2699 Other pulmonary embolism without acute cor pulmonale: Secondary | ICD-10-CM

## 2022-06-02 DIAGNOSIS — J45901 Unspecified asthma with (acute) exacerbation: Secondary | ICD-10-CM

## 2022-06-02 DIAGNOSIS — I10 Essential (primary) hypertension: Secondary | ICD-10-CM

## 2022-06-02 DIAGNOSIS — I2693 Single subsegmental pulmonary embolism without acute cor pulmonale: Secondary | ICD-10-CM | POA: Diagnosis not present

## 2022-06-02 DIAGNOSIS — E876 Hypokalemia: Secondary | ICD-10-CM

## 2022-06-02 DIAGNOSIS — R778 Other specified abnormalities of plasma proteins: Secondary | ICD-10-CM

## 2022-06-02 DIAGNOSIS — J9601 Acute respiratory failure with hypoxia: Secondary | ICD-10-CM | POA: Diagnosis not present

## 2022-06-02 DIAGNOSIS — E782 Mixed hyperlipidemia: Secondary | ICD-10-CM

## 2022-06-02 LAB — ECHOCARDIOGRAM COMPLETE
AR max vel: 2.2 cm2
AV Area VTI: 2.17 cm2
AV Area mean vel: 2.23 cm2
AV Mean grad: 7 mmHg
AV Peak grad: 14.8 mmHg
Ao pk vel: 1.93 m/s
Area-P 1/2: 4.29 cm2
Height: 65 in
MV VTI: 2.34 cm2
S' Lateral: 2.4 cm
Weight: 2800.72 oz

## 2022-06-02 LAB — CBC
HCT: 39.6 % (ref 36.0–46.0)
Hemoglobin: 12.6 g/dL (ref 12.0–15.0)
MCH: 25.5 pg — ABNORMAL LOW (ref 26.0–34.0)
MCHC: 31.8 g/dL (ref 30.0–36.0)
MCV: 80.2 fL (ref 80.0–100.0)
Platelets: 336 10*3/uL (ref 150–400)
RBC: 4.94 MIL/uL (ref 3.87–5.11)
RDW: 14.8 % (ref 11.5–15.5)
WBC: 14.5 10*3/uL — ABNORMAL HIGH (ref 4.0–10.5)
nRBC: 0 % (ref 0.0–0.2)

## 2022-06-02 LAB — HEPARIN LEVEL (UNFRACTIONATED)
Heparin Unfractionated: 1.04 IU/mL — ABNORMAL HIGH (ref 0.30–0.70)
Heparin Unfractionated: 0.79 IU/mL — ABNORMAL HIGH (ref 0.30–0.70)
Heparin Unfractionated: 0.49 IU/mL (ref 0.30–0.70)

## 2022-06-02 LAB — COMPREHENSIVE METABOLIC PANEL
ALT: 28 U/L (ref 0–44)
AST: 42 U/L — ABNORMAL HIGH (ref 15–41)
Albumin: 4 g/dL (ref 3.5–5.0)
Alkaline Phosphatase: 37 U/L — ABNORMAL LOW (ref 38–126)
Anion gap: 9 (ref 5–15)
BUN: 15 mg/dL (ref 8–23)
CO2: 23 mmol/L (ref 22–32)
Calcium: 9.3 mg/dL (ref 8.9–10.3)
Chloride: 104 mmol/L (ref 98–111)
Creatinine, Ser: 0.9 mg/dL (ref 0.44–1.00)
GFR, Estimated: >60 mL/min (ref >60)
Glucose, Bld: 179 mg/dL — ABNORMAL HIGH (ref 70–99)
Potassium: 3.1 mmol/L — ABNORMAL LOW (ref 3.5–5.1)
Sodium: 136 mmol/L (ref 135–145)
Total Bilirubin: 0.3 mg/dL (ref 0.3–1.2)
Total Protein: 7.5 g/dL (ref 6.5–8.1)

## 2022-06-02 LAB — RESPIRATORY PANEL BY PCR

## 2022-06-02 LAB — TROPONIN I (HIGH SENSITIVITY)
Troponin I (High Sensitivity): 2083 ng/L (ref <18)
Troponin I (High Sensitivity): 2438 ng/L (ref <18)

## 2022-06-02 LAB — MAGNESIUM: Magnesium: 2.2 mg/dL (ref 1.7–2.4)

## 2022-06-02 LAB — MRSA NEXT GEN BY PCR, NASAL: MRSA by PCR Next Gen: NOT DETECTED

## 2022-06-02 MED ORDER — MORPHINE SULFATE (PF) 2 MG/ML IV SOLN: 1.0000 mg | INTRAVENOUS | Status: DC | PRN

## 2022-06-02 MED ORDER — METHYLPREDNISOLONE SODIUM SUCC 125 MG IJ SOLR
125.0000 mg | Freq: Two times a day (BID) | INTRAMUSCULAR | Status: DC
  Administered 2022-06-02 – 2022-06-03 (×3): 125 mg via INTRAVENOUS
  Filled 2022-06-02 (×3): qty 2

## 2022-06-02 MED ORDER — POTASSIUM CHLORIDE CRYS ER 20 MEQ PO TBCR
60.0000 meq | EXTENDED_RELEASE_TABLET | Freq: Once | ORAL | Status: AC
Start: 2022-06-02 — End: 2022-06-02
  Administered 2022-06-02: 60 meq via ORAL
  Filled 2022-06-02: qty 3

## 2022-06-02 MED ORDER — GUAIFENESIN ER 600 MG PO TB12
1200.0000 mg | ORAL_TABLET | Freq: Two times a day (BID) | ORAL | Status: DC
  Administered 2022-06-02: 1200 mg via ORAL
  Filled 2022-06-02: qty 2

## 2022-06-02 MED ORDER — IPRATROPIUM-ALBUTEROL 0.5-2.5 (3) MG/3ML IN SOLN
RESPIRATORY_TRACT | Status: AC
  Filled 2022-06-02: qty 3

## 2022-06-02 MED ORDER — IOHEXOL 350 MG/ML SOLN
100.0000 mL | Freq: Once | INTRAVENOUS | Status: AC | PRN
  Administered 2022-06-02: 100 mL via INTRAVENOUS

## 2022-06-02 MED ORDER — DEXTROMETHORPHAN POLISTIREX ER 30 MG/5ML PO SUER
60.0000 mg | Freq: Two times a day (BID) | ORAL | Status: DC | PRN
  Filled 2022-06-02: qty 10

## 2022-06-02 MED ORDER — PANTOPRAZOLE SODIUM 40 MG PO TBEC
40.0000 mg | DELAYED_RELEASE_TABLET | Freq: Two times a day (BID) | ORAL | Status: DC
  Administered 2022-06-02 – 2022-06-06 (×8): 40 mg via ORAL
  Filled 2022-06-02 (×8): qty 1

## 2022-06-02 MED ORDER — PNEUMOCOCCAL 20-VAL CONJ VACC 0.5 ML IM SUSY: 0.5000 mL | PREFILLED_SYRINGE | INTRAMUSCULAR | Status: DC

## 2022-06-02 MED ORDER — IPRATROPIUM-ALBUTEROL 0.5-2.5 (3) MG/3ML IN SOLN
3.0000 mL | RESPIRATORY_TRACT | Status: DC
  Administered 2022-06-02: 3 mL via RESPIRATORY_TRACT

## 2022-06-02 MED ORDER — DM-GUAIFENESIN ER 30-600 MG PO TB12
2.0000 | ORAL_TABLET | Freq: Two times a day (BID) | ORAL | Status: DC
  Administered 2022-06-02 – 2022-06-06 (×9): 2 via ORAL
  Filled 2022-06-02 (×2): qty 2
  Filled 2022-06-02: qty 1
  Filled 2022-06-02 (×3): qty 2
  Filled 2022-06-02 (×2): qty 1
  Filled 2022-06-02 (×2): qty 2

## 2022-06-02 MED ORDER — OXYCODONE HCL 5 MG PO TABS
5.0000 mg | ORAL_TABLET | ORAL | Status: DC | PRN
  Administered 2022-06-02 – 2022-06-06 (×20): 5 mg via ORAL
  Filled 2022-06-02 (×20): qty 1

## 2022-06-02 MED ORDER — TRAMADOL HCL 50 MG PO TABS: 50.0000 mg | ORAL_TABLET | Freq: Four times a day (QID) | ORAL | Status: DC | PRN

## 2022-06-02 MED ORDER — BENZONATATE 100 MG PO CAPS
200.0000 mg | ORAL_CAPSULE | Freq: Three times a day (TID) | ORAL | Status: DC | PRN
  Administered 2022-06-02 – 2022-06-03 (×5): 200 mg via ORAL
  Filled 2022-06-02 (×5): qty 2

## 2022-06-02 MED ORDER — ORAL CARE MOUTH RINSE: 15.0000 mL | OROMUCOSAL | Status: DC | PRN

## 2022-06-02 MED ORDER — ASPIRIN 81 MG PO TBEC
81.0000 mg | DELAYED_RELEASE_TABLET | Freq: Every day | ORAL | Status: DC
  Administered 2022-06-02 – 2022-06-09 (×8): 81 mg via ORAL
  Filled 2022-06-02 (×8): qty 1

## 2022-06-02 MED ORDER — IPRATROPIUM-ALBUTEROL 0.5-2.5 (3) MG/3ML IN SOLN
3.0000 mL | Freq: Four times a day (QID) | RESPIRATORY_TRACT | Status: DC
  Administered 2022-06-02 – 2022-06-06 (×17): 3 mL via RESPIRATORY_TRACT
  Filled 2022-06-02 (×14): qty 3

## 2022-06-02 NOTE — Progress Notes (Signed)
ANTICOAGULATION CONSULT NOTE   Pharmacy Consult for heparin Indication: pulmonary embolus  No Known Allergies  Patient Measurements: Height: '5\' 5"'$  (165.1 cm) Weight: 79.4 kg (175 lb 0.7 oz) IBW/kg (Calculated) : 57 Heparin Dosing Weight: 74 kg  Vital Signs: Temp: 98.2 F (36.8 C) (07/06 0029) Temp Source: Oral (07/06 0029) BP: 127/69 (07/06 0400) Pulse Rate: 93 (07/06 0400)  Labs: Recent Labs    06/01/22 1514 06/01/22 1650 06/01/22 2011 06/02/22 0334  HGB 13.9  --   --  12.6  HCT 44.4  --   --  39.6  PLT 364  --   --  336  HEPARINUNFRC  --   --   --  1.04*  CREATININE 0.81  --   --   --   TROPONINIHS 48* 270* 847*  --      Estimated Creatinine Clearance: 59.6 mL/min (by C-G formula based on SCr of 0.81 mg/dL).   Medical History: Past Medical History:  Diagnosis Date   Hypertension     Medications:  Medications Prior to Admission  Medication Sig Dispense Refill Last Dose   albuterol (VENTOLIN HFA) 108 (90 Base) MCG/ACT inhaler Inhale into the lungs.   05/31/2022   cetirizine (ZYRTEC) 10 MG tablet Take 10 mg by mouth daily.   05/31/2022   diclofenac Sodium (VOLTAREN) 1 % GEL Apply 2 g topically 4 (four) times daily.   unknown   fluticasone (FLONASE) 50 MCG/ACT nasal spray Place into both nostrils.   unknown   losartan-hydrochlorothiazide (HYZAAR) 100-25 MG tablet Take 1 tablet by mouth daily.   05/31/2022   montelukast (SINGULAIR) 10 MG tablet Take 10 mg by mouth daily.   05/31/2022   Probiotic Product (TRUBIOTICS PO) Take 1 capsule by mouth daily.   unknown   rosuvastatin (CRESTOR) 5 MG tablet Take 5 mg by mouth 2 (two) times a week. Sundays & Thursdays   05/29/2022   acetaminophen (TYLENOL) 500 MG tablet Take 500 mg by mouth daily as needed for mild pain (for pain).      TRELEGY ELLIPTA 100-62.5-25 MCG/ACT AEPB Inhale 1 puff into the lungs daily.       Assessment: Pharmacy consulted to dose heparin in patient with possible pulmonary embolism.  Patient is not on  anticoagulation prior to admission.  Heparin level above goal: 1.04, RN reports lab collected from opposite arm of infusion. Suspect elevated level may be due to earlier bolus. CBC stable WNL; No overt bruising or bleeding reported   Goal of Therapy:  Heparin level 0.3-0.7 units/ml Monitor platelets by anticoagulation protocol: Yes   Plan:  Reduce heparin infusion (~3 units/kg/hr) to 1000 units/hr Check anti-Xa level in 8 hours and daily while on heparin Continue to monitor H&H and platelets  Georga Bora, PharmD Clinical Pharmacist 06/02/2022 4:47 AM Please check AMION for all [facility] Pharmacy numbers

## 2022-06-02 NOTE — Progress Notes (Signed)
ANTICOAGULATION CONSULT NOTE - Follow Up Consult  Pharmacy Consult for heparin Indication: pulmonary embolus  Labs: Recent Labs    06/01/22 1514 06/01/22 1650 06/01/22 2011 06/02/22 0334 06/02/22 0839 06/02/22 1345 06/02/22 2225  HGB 13.9  --   --  12.6  --   --   --   HCT 44.4  --   --  39.6  --   --   --   PLT 364  --   --  336  --   --   --   HEPARINUNFRC  --   --   --  1.04*  --  0.79* 0.49  CREATININE 0.81  --   --  0.90  --   --   --   TROPONINIHS 48*   < > 847* 2,083* 2,438*  --   --    < > = values in this interval not displayed.    Assessment/Plan:  76yo female therapeutic on heparin after rate changes. Will continue infusion at current rate of 850 units/hr and confirm stable with am labs.   Wynona Neat, PharmD, BCPS  06/02/2022,11:27 PM

## 2022-06-02 NOTE — Progress Notes (Signed)
ANTICOAGULATION CONSULT NOTE   Pharmacy Consult for heparin Indication: pulmonary embolus  No Known Allergies  Patient Measurements: Height: '5\' 5"'$  (165.1 cm) Weight: 79.4 kg (175 lb 0.7 oz) IBW/kg (Calculated) : 57 Heparin Dosing Weight: 74 kg  Vital Signs: Temp: 98.2 F (36.8 C) (07/06 1200) Temp Source: Oral (07/06 1200) BP: 133/72 (07/06 0700) Pulse Rate: 88 (07/06 0700)  Labs: Recent Labs    06/01/22 1514 06/01/22 1650 06/01/22 2011 06/02/22 0334 06/02/22 0839 06/02/22 1345  HGB 13.9  --   --  12.6  --   --   HCT 44.4  --   --  39.6  --   --   PLT 364  --   --  336  --   --   HEPARINUNFRC  --   --   --  1.04*  --  0.79*  CREATININE 0.81  --   --  0.90  --   --   TROPONINIHS 48*   < > 847* 2,083* 2,438*  --    < > = values in this interval not displayed.    Estimated Creatinine Clearance: 53.7 mL/min (by C-G formula based on SCr of 0.9 mg/dL).  Medical History: Past Medical History:  Diagnosis Date   Asthma    Hypertension    Medications:  Medications Prior to Admission  Medication Sig Dispense Refill Last Dose   albuterol (VENTOLIN HFA) 108 (90 Base) MCG/ACT inhaler Inhale into the lungs.   05/31/2022   cetirizine (ZYRTEC) 10 MG tablet Take 10 mg by mouth daily.   05/31/2022   diclofenac Sodium (VOLTAREN) 1 % GEL Apply 2 g topically 4 (four) times daily.   unknown   fluticasone (FLONASE) 50 MCG/ACT nasal spray Place into both nostrils.   unknown   losartan-hydrochlorothiazide (HYZAAR) 100-25 MG tablet Take 1 tablet by mouth daily.   05/31/2022   montelukast (SINGULAIR) 10 MG tablet Take 10 mg by mouth daily.   05/31/2022   Probiotic Product (TRUBIOTICS PO) Take 1 capsule by mouth daily.   unknown   rosuvastatin (CRESTOR) 5 MG tablet Take 5 mg by mouth 2 (two) times a week. Sundays & Thursdays   05/29/2022   acetaminophen (TYLENOL) 500 MG tablet Take 500 mg by mouth daily as needed for mild pain (for pain).      TRELEGY ELLIPTA 100-62.5-25 MCG/ACT AEPB Inhale 1 puff  into the lungs daily.       Assessment: Pharmacy consulted to dose heparin in patient with possible pulmonary embolism.  Patient is not on anticoagulation prior to admission.  Heparin level remains above goal: 0.79, despite earlier rate reduction. CBC stable WNL; No overt bruising or bleeding reported   Goal of Therapy:  Heparin level 0.3-0.7 units/ml Monitor platelets by anticoagulation protocol: Yes   Plan:  Reduce heparin infusion to 850 units/hr Check anti-Xa level in 8 hours and daily while on heparin Continue to monitor H&H and platelets  Hart Robinsons, PharmD Clinical Pharmacist 06/02/2022 3:03 PM Please check AMION for all [place] numbers

## 2022-06-02 NOTE — Consult Note (Signed)
NAME:  Tammy [name], MRN:  [id number], DOB:  08-May-1946, LOS: 1 ADMISSION DATE:  06/01/2022, CONSULTATION DATE:  06/02/22 REFERRING MD:  [facility] , CHIEF COMPLAINT:  sob    History of Present Illness:   76 y.o. female remote smoker with medical history significant of hypertension and controlled asthma who presents to ED with a chief complaint of dyspnea and severe dry coughing fits.  .  She reports that her dyspnea was gradual in onset and progressively worsening since night PTA >>  not able to lie  flat s noting  any swelling in her legs.  She has no history of CHF/fluid overload.  Her dyspnea was also worse on exertion.   She denied chest pain.     Baseline = "Fine until July 4"  fully active, rare need for saba no problem with nasal symptoms, no uri or overt GERD symptoms, cp, sorethroat or dysphagia then "just could not quit coughing to point of choking onset July 4 and not better with saba hfa and no better since admit on [facility]       Significant Hospital Events: Including procedures, antibiotic start and stop dates in addition to other pertinent events   CTa 7/5: small filling defect in a subsegmental pulmonary artery supplying the left lower lobe -  Venous dopplers 7/6 neg bilaterally    Scheduled Meds:  aspirin EC  81 mg Oral Daily   Chlorhexidine Gluconate Cloth  6 each Topical Q0600   fluticasone furoate-vilanterol  1 puff Inhalation Daily   And   umeclidinium bromide  1 puff Inhalation Daily   guaiFENesin  1,200 mg Oral BID   losartan  100 mg Oral Daily   And   hydrochlorothiazide  25 mg Oral Daily   ipratropium-albuterol  3 mL Nebulization Q4H   ipratropium-albuterol       loratadine  10 mg Oral Daily   melatonin  6 mg Oral QHS   methylPREDNISolone (SOLU-MEDROL) injection  125 mg Intravenous Q12H   [START ON 06/03/2022] pneumococcal 20-valent conjugate vaccine  0.5 mL Intramuscular Tomorrow-1000   potassium chloride  60 mEq Oral Once   rosuvastatin  5 mg Oral Once  per day on Sun Thu   Continuous Infusions:  heparin 1,000 Units/hr (06/02/22 0457)   PRN Meds:.acetaminophen **OR** acetaminophen, albuterol, benzonatate, dextromethorphan, ipratropium-albuterol, morphine injection, ondansetron **OR** ondansetron (ZOFRAN) IV, mouth rinse, oxyCODONE    Interim History / Subjective:  "Can't stop coughing"   Objective   Blood pressure 133/72, pulse 88, temperature 98.2 F (36.8 C), temperature source Oral, resp. rate 19, height '5\' 5"'$  (1.651 m), weight 79.4 kg, SpO2 98 %.        Intake/Output Summary (Last 24 hours) at 06/02/2022 1021 Last data filed at 06/01/2022 1557 Gross per 24 hour  Intake 43.8 ml  Output --  Net 43.8 ml   Filed Weights   06/01/22 1921 06/01/22 2200  Weight: 79.4 kg 79.4 kg    Examination:  Tmax:  99.5 General appearance:    elderly bf with severe harsh coughing fits with use of voice   At Rest 02 sats  98% on 4lpm   No jvd Oropharynx clear,  mucosa nl Neck supple Lungs with a few scattered exp > insp rhonchi bilaterally - mostly upper airway "wheeze"  RRR no s3 or or sign murmur Abd obese with nl  excursion  Extr warm with no edema or clubbing noted Neuro  Sensorium intact ,  no apparent motor deficits  Assessment & Plan:  1)  Acute hypoxemic resp failure in setting of ? Subsegmental PE and severe upper airway coughing ? Etiology   DDX of  difficult airways management almost all start with A and  include Adherence, Ace Inhibitors, Acid Reflux, Active Sinus Disease, Alpha 1 Antitripsin deficiency, Anxiety masquerading as Airways dz,  ABPA,  Allergy(esp in young), Aspiration (esp in elderly), Adverse effects of meds,  Active smoking or vaping, A bunch of PE's (a small clot burden can't cause this syndrome unless there is already severe underlying pulm or vascular dz with poor reserve) plus two Bs  = Bronchiectasis and Beta blocker use..and one C= CHF  ? Acid (or non-acid) GERD > always difficult to exclude as up to  75% of pts in some series report no assoc GI/ Heartburn symptoms> rec max (24h)  acid suppression    ? Adverse effects of meds, esp DPI   > stop all dpi   ? Allergy /asthma > steroids and nebs only  ? Acute/ active sinus dz > nothing by hx to suggest   ? A bunch of PE's > doubt this one subsegmental lesion is responsible for her symptoms  ? Chf/ cardiac asthma > bnp of less than 100 assuring in this setting    2) Upper airway cough syndrome:  Upper airway cough syndrome (previously labeled PNDS),  is so named because it's frequently impossible to sort out how much is  CR/sinusitis with freq throat clearing (which can be related to primary GERD)   vs  causing  secondary (" extra esophageal")  GERD from wide swings in gastric pressure that occur with throat clearing, often  promoting self use of mint and menthol lozenges that reduce the lower esophageal sphincter tone and exacerbate the problem further in a cyclical fashion.   These are the same pts (now being labeled as having "irritable larynx syndrome" by some cough centers) who not infrequently have a history of having failed to tolerate ace inhibitors,  dry powder inhalers or biphosphonates or report having atypical/extraesophageal reflux symptoms that don't respond to standard doses of PPI  and are easily confused as having aecopd or asthma flares by even experienced allergists/ pulmonologists (myself included).   Of the three most common causes of  Sub-acute / recurrent or chronic cough, only one (GERD)  can actually contribute to/ trigger  the other two (asthma and post nasal drip syndrome)  and perpetuate the cylce of cough.  While not intuitively obvious, many patients with chronic low grade reflux do not cough until there is a primary insult that disturbs the protective epithelial barrier and exposes sensitive nerve endings.   This is typically viral but can due to PNDS and  either may apply here.   The point is that once this occurs, it  is difficult to eliminate the cycle  using anything but a maximally effective acid suppression regimen at least in the short run, accompanied by an appropriate diet to address non acid GERD and control / eliminate the cough itself for at least 3 days with use of max mucinex dm/ prn hydrocodone and flutter valve   Best Practice (right click and "Reselect all SmartList Selections" daily)    Per [facility]  Labs   CBC: Recent Labs  Lab 06/01/22 1514 06/02/22 0334  WBC 19.0* 14.5*  NEUTROABS 14.0*  --   HGB 13.9 12.6  HCT 44.4 39.6  MCV 80.3 80.2  PLT 364 989    Basic Metabolic Panel: Recent Labs  Lab 06/01/22 1514 06/02/22 0334  NA 138 136  K 3.7 3.1*  CL 101 104  CO2 27 23  GLUCOSE 167* 179*  BUN 12 15  CREATININE 0.81 0.90  CALCIUM 9.5 9.3  MG  --  2.2   GFR: Estimated Creatinine Clearance: 53.7 mL/min (by C-G formula based on SCr of 0.9 mg/dL). Recent Labs  Lab 06/01/22 1514 06/01/22 2011 06/02/22 0334  PROCALCITON  --  <0.10  --   WBC 19.0*  --  14.5*    Liver Function Tests: Recent Labs  Lab 06/01/22 1514 06/02/22 0334  AST 36 42*  ALT 28 28  ALKPHOS 45 37*  BILITOT 0.7 0.3  PROT 8.2* 7.5  ALBUMIN 4.6 4.0   No results for input(s): "LIPASE", "AMYLASE" in the last 168 hours. No results for input(s): "AMMONIA" in the last 168 hours.  ABG    Component Value Date/Time   HCO3 24.2 06/01/2022 2012   ACIDBASEDEF 0.7 06/01/2022 2012   O2SAT 86.1 06/01/2022 2012     Coagulation Profile: No results for input(s): "INR", "PROTIME" in the last 168 hours.  Cardiac Enzymes: No results for input(s): "CKTOTAL", "CKMB", "CKMBINDEX", "TROPONINI" in the last 168 hours.  HbA1C: No results found for: "HGBA1C"  CBG: No results for input(s): "GLUCAP" in the last 168 hours.     Past Medical History:  She,  has a past medical history of Asthma and Hypertension.   Surgical History:   Past Surgical History:  Procedure Laterality Date   ABDOMINAL HYSTERECTOMY   1988   CATARACT EXTRACTION W/PHACO Right 09/27/2019   Procedure: CATARACT EXTRACTION PHACO AND INTRAOCULAR LENS PLACEMENT (IOC) (CDE: 4.94);  Surgeon: Baruch Goldmann, MD;  Location: [facility];  Service: Ophthalmology;  Laterality: Right;   CATARACT EXTRACTION W/PHACO Left 10/18/2019   Procedure: CATARACT EXTRACTION PHACO AND INTRAOCULAR LENS PLACEMENT (IOC);  Surgeon: Baruch Goldmann, MD;  Location: [facility];  Service: Ophthalmology;  Laterality: Left;  CDE: 7.18   COLONOSCOPY N/A 08/11/2016   Procedure: COLONOSCOPY;  Surgeon: Rogene Houston, MD;  Location: [facility];  Service: Endoscopy;  Laterality: N/A;  730   ELBOW FRACTURE SURGERY     KNEE ARTHROSCOPY     SHOULDER OPEN ROTATOR CUFF REPAIR Right 10/06/2016   Procedure: OPEN ROTATOR CUFF REPAIR RIGHT SHOULDER;  Surgeon: Carole Civil, MD;  Location: [facility];  Service: Orthopedics;  Laterality: Right;     Social History:   reports that she quit smoking about 38 years ago. Her smoking use included cigarettes. She has a 11.25 pack-year smoking history. She has never used smokeless tobacco. She reports that she does not drink alcohol and does not use drugs.   Family History:  Her family history includes Cancer in her maternal grandmother.   Allergies No Known Allergies   Home Medications  Prior to Admission medications   Medication Sig Start Date End Date Taking? Authorizing Provider  albuterol (VENTOLIN HFA) 108 (90 Base) MCG/ACT inhaler Inhale into the lungs. 06/01/22  Yes [provider]  cetirizine (ZYRTEC) 10 MG tablet Take 10 mg by mouth daily.   Yes [provider]  diclofenac Sodium (VOLTAREN) 1 % GEL Apply 2 g topically 4 (four) times daily. 03/12/22  Yes [provider]  fluticasone (FLONASE) 50 MCG/ACT nasal spray Place into both nostrils. 03/03/22  Yes [provider]  losartan-hydrochlorothiazide (HYZAAR) 100-25 MG tablet Take 1 tablet by mouth daily. 10/28/17  Yes [provider]   montelukast (SINGULAIR) 10 MG tablet Take 10  mg by mouth daily. 05/21/22  Yes [provider]  Probiotic Product (TRUBIOTICS PO) Take 1 capsule by mouth daily.   Yes [provider]  rosuvastatin (CRESTOR) 5 MG tablet Take 5 mg by mouth 2 (two) times a week. Sundays & Thursdays   Yes [provider]  acetaminophen (TYLENOL) 500 MG tablet Take 500 mg by mouth daily as needed for mild pain (for pain).    [provider]  TRELEGY ELLIPTA 100-62.5-25 MCG/ACT AEPB Inhale 1 puff into the lungs daily. 06/01/22   [provider]       Christinia Gully, MD [facility] [phone]   After 7:00 pm call Elink  [phone]

## 2022-06-02 NOTE — Consult Note (Addendum)
Cardiology Consultation:   Patient ID: KAORI JUMPER MRN: [id number]; DOB: 02-14-1946  Admit date: 06/01/2022 Date of Consult: 06/02/2022  PCP:  Celene Squibb, MD   [facility] Providers Cardiologist:  Kate Sable, MD (Inactive)        Patient Profile:   Tammy [name] is a 76 y.o. female with a hx of HTN and asthma who is being seen 06/02/2022 for the evaluation of elevated troponin at the request of Dr. Clearence Ped.  History of Present Illness:   Tammy [name] was remotely evaluated for CP/dyspnea by Dr. Bronson Ing several years ago and had a normal stress test in 09/2017 with normal LVEF. She otherwise does not significant family hx of cardiac disease.   History is challenging as the patient has frequent spastic coughing and is only able to get one or two words out. She began having SOB and coughing 2 days ago, on Tuesday. No fevers or chills. Only has chest discomfort in her epigastrum and back with severe coughing fits. No edema. Not able to lie flat as it triggers the coughing as well. No sick contacts; has family members staying in her home from various places including [place], [place] and [place]. CXR showed NAD. CTA showed questionable small filling defect in a subsegmental pulmonary artery supplying the left lower lobe may represent a pulmonary embolism and aortic atherosclerosis; otherwise clear lungs without effusion. LE venous duplex is pending. Labs notable for troponin 48->270->847->2083, pro-cal <0.10, K 3.1 this AM, Cr 0.90, AST 42, ALT wnl, albumin 4.0, initial WBC 19k, BNP 93, Covid negative. She has been treated with nebs, '324mg'$  ASA, Tessalon, Mucinex, heparin per pharmacy, magnesium, steroids, and IV fluids. 2D echo pending. She remains on 6L HFNC; do not see specific mention in prior notes what she had desaturated to.   Past Medical History:  Diagnosis Date   Asthma    Hypertension     Past Surgical History:  Procedure Laterality Date   ABDOMINAL  HYSTERECTOMY  1988   CATARACT EXTRACTION W/PHACO Right 09/27/2019   Procedure: CATARACT EXTRACTION PHACO AND INTRAOCULAR LENS PLACEMENT (IOC) (CDE: 4.94);  Surgeon: Baruch Goldmann, MD;  Location: [facility];  Service: Ophthalmology;  Laterality: Right;   CATARACT EXTRACTION W/PHACO Left 10/18/2019   Procedure: CATARACT EXTRACTION PHACO AND INTRAOCULAR LENS PLACEMENT (IOC);  Surgeon: Baruch Goldmann, MD;  Location: [facility];  Service: Ophthalmology;  Laterality: Left;  CDE: 7.18   COLONOSCOPY N/A 08/11/2016   Procedure: COLONOSCOPY;  Surgeon: Rogene Houston, MD;  Location: [facility];  Service: Endoscopy;  Laterality: N/A;  730   ELBOW FRACTURE SURGERY     KNEE ARTHROSCOPY     SHOULDER OPEN ROTATOR CUFF REPAIR Right 10/06/2016   Procedure: OPEN ROTATOR CUFF REPAIR RIGHT SHOULDER;  Surgeon: Carole Civil, MD;  Location: [facility];  Service: Orthopedics;  Laterality: Right;     Home Medications:  Prior to Admission medications   Medication Sig Start Date End Date Taking? Authorizing Provider  albuterol (VENTOLIN HFA) 108 (90 Base) MCG/ACT inhaler Inhale into the lungs. 06/01/22  Yes [provider]  cetirizine (ZYRTEC) 10 MG tablet Take 10 mg by mouth daily.   Yes [provider]  diclofenac Sodium (VOLTAREN) 1 % GEL Apply 2 g topically 4 (four) times daily. 03/12/22  Yes [provider]  fluticasone (FLONASE) 50 MCG/ACT nasal spray Place into both nostrils. 03/03/22  Yes [provider]  losartan-hydrochlorothiazide (HYZAAR) 100-25 MG tablet Take 1 tablet by mouth daily. 10/28/17  Yes [provider]  montelukast (SINGULAIR) 10 MG tablet Take 10 mg by mouth daily. 05/21/22  Yes [provider]  Probiotic Product (TRUBIOTICS PO) Take 1 capsule by mouth daily.   Yes [provider]  rosuvastatin (CRESTOR) 5 MG tablet Take 5 mg by mouth 2 (two) times a week. Sundays & Thursdays   Yes [provider]  acetaminophen (TYLENOL) 500 MG  tablet Take 500 mg by mouth daily as needed for mild pain (for pain).    [provider]  TRELEGY ELLIPTA 100-62.5-25 MCG/ACT AEPB Inhale 1 puff into the lungs daily. 06/01/22   [provider]    Inpatient Medications: Scheduled Meds:  Chlorhexidine Gluconate Cloth  6 each Topical Q0600   fluticasone furoate-vilanterol  1 puff Inhalation Daily   And   umeclidinium bromide  1 puff Inhalation Daily   guaiFENesin  600 mg Oral BID   losartan  100 mg Oral Daily   And   hydrochlorothiazide  25 mg Oral Daily   ipratropium-albuterol  3 mL Nebulization Q6H   loratadine  10 mg Oral Daily   melatonin  6 mg Oral QHS   methylPREDNISolone (SOLU-MEDROL) injection  125 mg Intravenous Q12H   Followed by   Derrill Memo ON 06/03/2022] predniSONE  40 mg Oral Q breakfast   [START ON 06/03/2022] pneumococcal 20-valent conjugate vaccine  0.5 mL Intramuscular Tomorrow-1000   rosuvastatin  5 mg Oral Once per day on Sun Thu   Continuous Infusions:  heparin 1,000 Units/hr (06/02/22 0457)   PRN Meds: acetaminophen **OR** acetaminophen, albuterol, benzonatate, morphine injection, ondansetron **OR** ondansetron (ZOFRAN) IV, mouth rinse, oxyCODONE  Allergies:   No Known Allergies  Social History:   Social History   Socioeconomic History   Marital status: Widowed    Spouse name: Not on file   Number of children: Not on file   Years of education: Not on file   Highest education level: Not on file  Occupational History   Not on file  Tobacco Use   Smoking status: Former    Packs/day: 0.75    Years: 15.00    Total pack years: 11.25    Types: Cigarettes    Quit date: 11/28/1983    Years since quitting: 38.5   Smokeless tobacco: Never  Vaping Use   Vaping Use: Never used  Substance and Sexual Activity   Alcohol use: No   Drug use: No   Sexual activity: Not on file  Other Topics Concern   Not on file  Social History Narrative   Not on file   Social Determinants of Health   Financial  Resource Strain: Not on file  Food Insecurity: Not on file  Transportation Needs: Not on file  Physical Activity: Not on file  Stress: Not on file  Social Connections: Not on file  Intimate Partner Violence: Not on file    Family History:   Family History  Problem Relation Age of Onset   Cancer Maternal Grandmother      ROS:  Please see the history of present illness.  All other ROS reviewed and negative.     Physical Exam/Data:   Vitals:   06/02/22 0600 06/02/22 0655 06/02/22 0700 06/02/22 0747  BP: 131/64  133/72   Pulse: 92  88   Resp: (!) 26  19   Temp:  (P) 99.5 F (37.5 C)  98.2 F (36.8 C)  TempSrc:  (P) Oral  Oral  SpO2: 99%  100%   Weight:  Height:        Intake/Output Summary (Last 24 hours) at 06/02/2022 0818 Last data filed at 06/01/2022 1557 Gross per 24 hour  Intake 43.8 ml  Output --  Net 43.8 ml      06/01/2022   10:00 PM 06/01/2022    7:21 PM 09/27/2019    9:02 AM  Last 3 Weights  Weight (lbs) 175 lb 0.7 oz 175 lb 173 lb  Weight (kg) 79.4 kg 79.379 kg 78.472 kg     Body mass index is 29.13 kg/m.  General: Well developed, well nourished AAF with increased WOB and frequent spastic dry coughing Head: Normocephalic, atraumatic, sclera non-icteric, no xanthomas, nares are without discharge. Neck: Negative for carotid bruits. JVP not elevated. Lungs: Diffusely diminished and restricted without wheezing, rales or rhonchi. Breathing is somewhat labored  Heart: Reg rhythm, tachycardic, S1 S2 without murmurs, rubs, or gallops.  Abdomen: Soft, non-tender, non-distended with normoactive bowel sounds. No rebound/guarding. Extremities: No clubbing or cyanosis. No edema. Distal pedal pulses are 2+ and equal bilaterally. Neuro: Alert and oriented X 3. Moves all extremities spontaneously. Psych:  Responds to questions in short 1-2 words due to breathing with a normal affect.   EKG:  The EKG was personally reviewed and demonstrates:  sinus tach 107bpm, prior  anterior infarct, nonspecific ST upsloping avR, avL, subtle ST sagging inferiorly and V5-V6 - largely appears similar to 2018  Telemetry:  Telemetry was personally reviewed and demonstrates:  ST occasional PVCs  Relevant CV Studies: Nuc 09/2017   Blood pressure demonstrated a hypertensive response to exercise. There was no ST segment deviation noted during stress. The study is normal. No myocardial ischemia or scar. This is a low risk study. Nuclear stress EF: 64%.      Laboratory Data:  High Sensitivity Troponin:   Recent Labs  Lab 06/01/22 1514 06/01/22 1650 06/01/22 2011 06/02/22 0334  TROPONINIHS 48* 270* 847* 2,083*     Chemistry Recent Labs  Lab 06/01/22 1514 06/02/22 0334  NA 138 136  K 3.7 3.1*  CL 101 104  CO2 27 23  GLUCOSE 167* 179*  BUN 12 15  CREATININE 0.81 0.90  CALCIUM 9.5 9.3  MG  --  2.2  GFRNONAA >60 >60  ANIONGAP 10 9    Recent Labs  Lab 06/01/22 1514 06/02/22 0334  PROT 8.2* 7.5  ALBUMIN 4.6 4.0  AST 36 42*  ALT 28 28  ALKPHOS 45 37*  BILITOT 0.7 0.3   Lipids No results for input(s): "CHOL", "TRIG", "HDL", "LABVLDL", "LDLCALC", "CHOLHDL" in the last 168 hours.  Hematology Recent Labs  Lab 06/01/22 1514 06/02/22 0334  WBC 19.0* 14.5*  RBC 5.53* 4.94  HGB 13.9 12.6  HCT 44.4 39.6  MCV 80.3 80.2  MCH 25.1* 25.5*  MCHC 31.3 31.8  RDW 14.3 14.8  PLT 364 336   Thyroid No results for input(s): "TSH", "FREET4" in the last 168 hours.  BNP Recent Labs  Lab 06/01/22 1514  BNP 93.0    DDimer No results for input(s): "DDIMER" in the last 168 hours.   Radiology/Studies:  CT Angio Chest PE W and/or Wo Contrast  Result Date: 06/01/2022 CLINICAL DATA:  Shortness of breath, concern for pulmonary embolism. EXAM: CT ANGIOGRAPHY CHEST WITH CONTRAST TECHNIQUE: Multidetector CT imaging of the chest was performed using the standard protocol during bolus administration of intravenous contrast. Multiplanar CT image reconstructions and MIPs  were obtained to evaluate the vascular anatomy. RADIATION DOSE REDUCTION: This exam was performed according to  the departmental dose-optimization program which includes automated exposure control, adjustment of the mA and/or kV according to patient size and/or use of iterative reconstruction technique. CONTRAST:  74m OMNIPAQUE IOHEXOL 350 MG/ML SOLN COMPARISON:  Chest radiograph performed the same day. FINDINGS: Cardiovascular: Satisfactory opacification of the pulmonary arteries to the segmental level. There is a questionable filling defect in a subsegmental pulmonary artery supplying the left lower lobe (series 5, image [id number]). Vascular calcifications are seen in the aortic arch. Normal heart size. No pericardial effusion. Mediastinum/Nodes: No enlarged mediastinal, hilar, or axillary lymph nodes. Thyroid gland, trachea, and esophagus demonstrate no significant findings. Lungs/Pleura: Lungs are clear. No pleural effusion or pneumothorax. Upper Abdomen: No acute abnormality. Musculoskeletal: Degenerative changes are seen in the spine. Review of the MIP images confirms the above findings. IMPRESSION: 1. Questionable small filling defect in a subsegmental pulmonary artery supplying the left lower lobe may represent a pulmonary embolism. If it would help direct clinical management, a follow-up CT PE protocol could be performed in 24 hours to confirm the presence/absence of this finding. Aortic Atherosclerosis (ICD10-I70.0). Electronically Signed   By: TZerita BoersM.D.   On: 06/01/2022 18:44   DG Chest Port 1 View  Result Date: 06/01/2022 CLINICAL DATA:  Shortness of breath EXAM: PORTABLE CHEST 1 VIEW COMPARISON:  None Available. FINDINGS: The heart size and mediastinal contours are within normal limits. Both lungs are clear. Degenerative joint changes of bilateral shoulders are noted. Scoliosis of spine noted. IMPRESSION: No active disease. Electronically Signed   By: WAbelardo DieselM.D.   On: 06/01/2022 15:32      Assessment and Plan:   1. Respiratory distress with shortness of breath and spastic coughing, concerned for asthma exacerbation - exam seems consistent with bronchospasm - have notified IM team to please come assess due to frequent spastic coughing and difficulty breathing - would consider pulm consult - IM also evaluating for PE (potentially seen on CT), LE venous duplex pending - may need further investigation for pertussis or viral panel pathogens  2. Elevated troponin - hsTroponin peaked at 2,083 thus far in setting of physiologic demands of respiratory failure. Demand ischemia suspected but cannot fully exclude underlying CAD. Will review CT with MD to assess for coronary calcifications, but suspect will defer ischemic eval until pulmonary status is more stable - BNP, CT argue against acute volume overload - she is having chest discomfort with severe coughing fits but no overt anginal type pain - remains on heparin per pharmacy - will discuss ASA dosing with MD - no beta blocker due to bronchospasm - lipid panel in AM (already on home statin)  - await echo - treat pulm status  3. Essential HTN - controlled, per IM  4. Hypokalemia - will defer replacement to IM team given the degree of respiratory distress she has; may need IV replacement - likely driven down by nebs   Risk Assessment/Risk Scores:     TIMI Risk Score for Unstable Angina or Non-ST Elevation MI:   The patient's TIMI risk score is 2, which indicates a 8% risk of all cause mortality, new or recurrent myocardial infarction or need for urgent revascularization in the next 14 days.          For questions or updates, please contact [place]ushy CreekPlease consult [email or web address] for contact info under    Signed, DCharlie Pitter PA-C  06/02/2022 8:18 AM   Patient seen and examined and agree with DMelina Copa PA-C as detailed above.  In brief,  the patient is a 76 year old female with HTN and asthma who presented  with severe SOB and cough found to have acute asthma exacerbation with course complicated by trop elevation from [phone] for which Cardiology was consulted.  Patient with no known history of CAD. Had stress test in 09/2017 which was normal. She presented on this admission with progressive SOB and cough found to have acute hypoxic respiratory failure from asthma exacerbation.   Here, labs notable for trop 48->270->847->2083, pro-cal <0.10, BNP 93. CXR with no acute process. CTA PE protocol with possible left lower lobe PE and aortic atherosclerosis. TTE pending. She has been started on nebs and steroids. Currently, remains very dyspneic with poor air movement this AM. Only able to speak in 1-2 word sentences with significant coughing and wheezing.  Overall, suspect trop elevation secondary to demand in the setting of acute asthma exacerbation with low suspicion for ACS at this time. She may, however, have significant underlying coronary artery disease. Notably, ECG with q-waves in the anterior leads that appear new compared to prior. Will plan to stabilize from a respiratory standpoint and once more clinically stable, can pursue ischemic work-up at that time. Not a good myoview candidate due to asthma. Likely will need to pursue coronary CTA vs cath.  GEN: Sitting up in bed, dyspneic, coughing throughout exam   Neck: No JVD Cardiac: Tachycardic, regular, no murmurs Respiratory: Diffusely wheezing, poor air movement GI: Soft, nontender, non-distended  MS: No edema; No deformity. Neuro:  Nonfocal  Psych: Normal affect    Plan: -Suspect trop elevation secondary to demand in the setting of acute hypoxic respiratory failure from asthma exacerbation -Once more stable from a respiratory standpoint, can pursue ischemic work-up at that time -Not a good myoview candidate due to severe asthma exacerbation; would need either coronary CTA vs cath -Continue heparin gtt given concern for PE -Will start ASA  '81mg'$  daily and continue crestor '5mg'$  daily  -Follow-up TTE -Management of acute asthma exacerbation per primary  Gwyndolyn Kaufman, MD

## 2022-06-02 NOTE — TOC Progression Note (Signed)
  Transition of Care [facility]) Screening Note   Patient Details  Name: CAMBRI PLOURDE Date of Birth: Sep 09, 1946   Transition of Care [facility]) CM/SW Contact:    Shade Flood, LCSW Phone Number: 06/02/2022, 9:34 AM    Transition of Care Department [facility]) has reviewed patient and no TOC needs have been identified at this time. We will continue to monitor patient advancement through interdisciplinary progression rounds. If new patient transition needs arise, please place a TOC consult.

## 2022-06-02 NOTE — Progress Notes (Signed)
PROGRESS NOTE   Tammy [name]  LKG:[id number] DOB: Nov 09, 1946 DOA: 06/01/2022 PCP: Celene Squibb, MD   Chief Complaint  Patient presents with   Shortness of Breath   Level of care: Stepdown  Brief Admission History:   76 y.o. female with medical history significant of hypertension and controlled asthma who presents to ED with a chief complaint of dyspnea.  Patient reportedly was quite short of breath that hesitation, only able to speak in 2-3 word phrases, and tripoding.  At the time of my exam patient is no longer tripoding, but still cannot complete a sentence without having to close her eyes and focus on breathing.  She has an audible wheeze from the doorway.  Due to her work of breathing, history is quite limited.  She reports that her dyspnea was gradual in onset and progressively worsening since last night.  She was not able to lay flat at all last night.  She has not noticed any swelling in her legs.  She has no history of CHF/fluid overload.  Her dyspnea was also worse on exertion.  It became acutely worse in the middle of the night last night.  She denies chest pain.  Her sister, at bedside, provides some extra history.  She reports that patient has recently had family members staying in her home from [place] places including [place], [place], [place].    Assessment and Plan: * Acute respiratory failure with hypoxia ([place]) - Patient has no oxygen requirement at baseline -Currently requiring 4 L nasal cannula to maintain oxygen sats and still has increased work of breathing - consulted to [facility] pulmonologist Dr. Melvyn Novas for assistance with diagnosis and mgmt.  -BiPAP ordered if needed -Continue Singulair, Trelegy, steroids -With the wheezing, and seems to be asthma exacerbation -Troponin elevation likely demand ischemia -Possible PE on CTA, continue heparin, repeat CTA study as radiologist recommended -COVID negative -respiratory pathogen panel pending -Continue to monitor in stepdown  ICU  Elevated troponin - Troponin elevation likely demand ischemia in setting of respiratory distress (acute) -Continue to cycle troponins -EKG shows sinus tachycardia with a rate of 107, QTc 466, there is baseline artifact but appears to be some ST depression in the inferior leads -Denying chest pain at this time -Cardiology consulted -This is likely demand ischemia in the setting of acute hypoxic respiratory failure with possible PE -Repeat EKG for any new episodes of chest pain -Continue to monitor  Asthma, chronic, unspecified asthma severity, with acute exacerbation - Wheezing, cough, severe SOB is concerning.   -COVID negative -Only new exposure in the home with recent visitors from out of the country -Patient is not a smoker -Chest x-ray shows no active disease -There is a leukocytosis, patient does have a cough, but has been afebrile with no sick contacts-check a procalcitonin -Continue as needed albuterol, scheduled DuoNeb, steroids, BiPAP -Continue to monitor  Hyperlipidemia - Continue Crestor  Leukocytosis - White blood cell count coming down - ?leukemoid reaction on steroids -No active disease on chest x-ray -Afebrile, but cough is present -COVID negative -Procalcitonin <0.10   Acute pulmonary embolism (HCC) - Presumed PE with possible subsegmental PE described on CTA -CTA recommends repeat in 24 hours for confirmation -this order has not been placed.  If ultrasound bilateral DVT is positive in the a.m. it may be possible to avoid second radiation exposure with repeat CTA -Ultrasound DVT negative for clot in legs.  -Echo reassuring  -Continue heparin drip -Continue to monitor -repeat CTA chest pending 7/6 per radiologist  recommendation  Essential hypertension - Patient is on an ARB and HCTZ at baseline -Systolic blood pressure maintaining in the low 100s at this time, holding home medication -Continue to monitor  DVT prophylaxis: IV heparin  Code Status:  Full  Family Communication:  Disposition: Status is: Inpatient Remains inpatient appropriate because: Intensity is deserving of inpatient status   Consultants:  PCCM Dr. Melvyn Novas  Procedures:   Antimicrobials:    Subjective: Pt severely SOB only able to speak 2-3 words without coughing, wheezing, SOB, denies CP to me Objective: Vitals:   06/02/22 0841 06/02/22 1147 06/02/22 1200 06/02/22 1552  BP:      Pulse:      Resp:      Temp:   98.2 F (36.8 C) 98.2 F (36.8 C)  TempSrc:   Oral Oral  SpO2: 98% 99%    Weight:      Height:       No intake or output data in the 24 hours ending 06/02/22 1755 Filed Weights   06/01/22 1921 06/01/22 2200  Weight: 79.4 kg 79.4 kg   Examination:  General exam: Appears moderately distressed. Loud frequent coughing and wheezing.  Respiratory system: diffuse fine expiratory wheezing bilateral. Cardiovascular system: normal S1 & S2 heard. No JVD, murmurs, rubs, gallops or clicks. No pedal edema. Gastrointestinal system: Abdomen is nondistended, soft and nontender. No organomegaly or masses felt. Normal bowel sounds heard. Central nervous system: Alert and oriented. No focal neurological deficits. Extremities: Symmetric 5 x 5 power. Skin: No rashes, lesions or ulcers. Psychiatry: Judgement and insight appear normal. Mood & affect appropriate.   Data Reviewed: I have personally reviewed following labs and imaging studies  CBC: Recent Labs  Lab 06/01/22 1514 06/02/22 0334  WBC 19.0* 14.5*  NEUTROABS 14.0*  --   HGB 13.9 12.6  HCT 44.4 39.6  MCV 80.3 80.2  PLT 364 585    Basic Metabolic Panel: Recent Labs  Lab 06/01/22 1514 06/02/22 0334  NA 138 136  K 3.7 3.1*  CL 101 104  CO2 27 23  GLUCOSE 167* 179*  BUN 12 15  CREATININE 0.81 0.90  CALCIUM 9.5 9.3  MG  --  2.2    CBG: No results for input(s): "GLUCAP" in the last 168 hours.  Recent Results (from the past 240 hour(s))  SARS Coronavirus 2 by RT PCR (hospital order,  performed in [facility] hospital lab) *cepheid single result test* Anterior Nasal Swab     Status: None   Collection Time: 06/01/22  8:01 PM   Specimen: Anterior Nasal Swab  Result Value Ref Range Status   SARS Coronavirus 2 by RT PCR NEGATIVE NEGATIVE Final    Comment: (NOTE) SARS-CoV-2 target nucleic acids are NOT DETECTED.  The SARS-CoV-2 RNA is generally detectable in upper and lower respiratory specimens during the acute phase of infection. The lowest concentration of SARS-CoV-2 viral copies this assay can detect is 250 copies / mL. A negative result does not preclude SARS-CoV-2 infection and should not be used as the sole basis for treatment or other patient management decisions.  A negative result may occur with improper specimen collection / handling, submission of specimen other than nasopharyngeal swab, presence of viral mutation(s) within the areas targeted by this assay, and inadequate number of viral copies (<250 copies / mL). A negative result must be combined with clinical observations, patient history, and epidemiological information.  Fact Sheet for Patients:   https://www.patel.info/  Fact Sheet for Healthcare Providers: https://hall.com/  This test  is not yet approved or  cleared by the [place] and has been authorized for detection and/or diagnosis of SARS-CoV-2 by FDA under an Emergency Use Authorization (EUA).  This EUA will remain in effect (meaning this test can be used) for the duration of the COVID-19 declaration under Section 564(b)(1) of the Act, 21 U.S.C. section 360bbb-3(b)(1), unless the authorization is terminated or revoked sooner.  Performed at [facility], [address] [place], [place] [address]   MRSA Next Gen by PCR, Nasal     Status: None   Collection Time: 06/01/22  9:35 PM   Specimen: Nasal Mucosa; Nasal Swab  Result Value Ref Range Status   MRSA by PCR Next Gen NOT DETECTED NOT  DETECTED Final    Comment: (NOTE) The GeneXpert MRSA Assay (FDA approved for NASAL specimens only), is one component of a comprehensive MRSA colonization surveillance program. It is not intended to diagnose MRSA infection nor to guide or monitor treatment for MRSA infections. Test performance is not FDA approved in patients less than 36 years old. Performed at [facility], [address] [place],  [address]      Radiology Studies: US Venous Img Lower Bilateral (DVT)  Result Date: 06/02/2022 CLINICAL DATA:  Shortness of breath.  Evaluate for DVT. EXAM: BILATERAL LOWER EXTREMITY VENOUS DOPPLER ULTRASOUND TECHNIQUE: Gray-scale sonography with graded compression, as well as color Doppler and duplex ultrasound were performed to evaluate the lower extremity deep venous systems from the level of the common femoral vein and including the common femoral, femoral, profunda femoral, popliteal and calf veins including the posterior tibial, peroneal and gastrocnemius veins when visible. The superficial great saphenous vein was also interrogated. Spectral Doppler was utilized to evaluate flow at rest and with distal augmentation maneuvers in the common femoral, femoral and popliteal veins. COMPARISON:  Right lower extremity venous Doppler ultrasound-06/29/2006 (negative). FINDINGS: RIGHT LOWER EXTREMITY Common Femoral Vein: No evidence of thrombus. Normal compressibility, respiratory phasicity and response to augmentation. Saphenofemoral Junction: No evidence of thrombus. Normal compressibility and flow on color Doppler imaging. Profunda Femoral Vein: No evidence of thrombus. Normal compressibility and flow on color Doppler imaging. Femoral Vein: No evidence of thrombus. Normal compressibility, respiratory phasicity and response to augmentation. Popliteal Vein: No evidence of thrombus. Normal compressibility, respiratory phasicity and response to augmentation. Calf Veins: No evidence of thrombus. Normal  compressibility and flow on color Doppler imaging. Superficial Great Saphenous Vein: No evidence of thrombus. Normal compressibility. Other Findings:  None. LEFT LOWER EXTREMITY Common Femoral Vein: No evidence of thrombus. Normal compressibility, respiratory phasicity and response to augmentation. Saphenofemoral Junction: No evidence of thrombus. Normal compressibility and flow on color Doppler imaging. Profunda Femoral Vein: No evidence of thrombus. Normal compressibility and flow on color Doppler imaging. Femoral Vein: No evidence of thrombus. Normal compressibility, respiratory phasicity and response to augmentation. Popliteal Vein: No evidence of thrombus. Normal compressibility, respiratory phasicity and response to augmentation. Calf Veins: No evidence of thrombus. Normal compressibility and flow on color Doppler imaging. Superficial Great Saphenous Vein: No evidence of thrombus. Normal compressibility. Other Findings:  None. IMPRESSION: No evidence of DVT within either lower extremity. Electronically Signed   By: Sandi Mariscal M.D.   On: 06/02/2022 11:55   ECHOCARDIOGRAM COMPLETE  Result Date: 06/02/2022    ECHOCARDIOGRAM REPORT   Patient Name:   Tammy [name] Date of Exam: 06/02/2022 Medical Rec #:  [id number]      Height:       65.0 in Accession #:  [id number]     Weight:       175.0 lb Date of Birth:  1946/09/03      BSA:          1.869 m Patient Age:    28 years       BP:           133/72 mmHg Patient Gender: F              HR:           102 bpm. Exam Location:  [facility] Procedure: 2D Echo, Cardiac Doppler and Color Doppler Indications:    Pulmonary embolus  History:        Patient has no prior history of Echocardiogram examinations.                 Risk Factors:Hypertension and Dyslipidemia.  Sonographer:    Wenda Low Referring Phys: [id number] ASIA B [place]  1. Left ventricular ejection fraction, by estimation, is 70 to 75%. The left ventricle has hyperdynamic function. The  left ventricle has no regional wall motion abnormalities. There is moderate hypertrophy of the basal septal segment. The rest of the LV  segments demonstrate mild left ventricular hypertrophy. Left ventricular diastolic parameters are consistent with Grade I diastolic dysfunction (impaired relaxation).  2. Right ventricular systolic function is normal. The right ventricular size is normal. There is mildly elevated pulmonary artery systolic pressure. The estimated right ventricular systolic pressure is 29.9 mmHg.  3. The mitral valve is grossly normal. Trivial mitral valve regurgitation.  4. The aortic valve is tricuspid. There is mild calcification of the aortic valve. There is mild thickening of the aortic valve. Aortic valve regurgitation is not visualized. Aortic valve sclerosis/calcification is present, without any evidence of aortic stenosis.  5. The inferior vena cava is dilated in size with >50% respiratory variability, suggesting right atrial pressure of 8 mmHg. Comparison(s): No prior Echocardiogram. FINDINGS  Left Ventricle: Left ventricular ejection fraction, by estimation, is 70 to 75%. The left ventricle has hyperdynamic function. The left ventricle has no regional wall motion abnormalities. The left ventricular internal cavity size was normal in size. There is moderate hypertrophy of the basal septal segment. The rest of the LV segments demonstrate mild left ventricular hypertrophy. Left ventricular diastolic parameters are consistent with Grade I diastolic dysfunction (impaired relaxation). Right Ventricle: The right ventricular size is normal. No increase in right ventricular wall thickness. Right ventricular systolic function is normal. There is mildly elevated pulmonary artery systolic pressure. The tricuspid regurgitant velocity is 2.84  m/s, and with an assumed right atrial pressure of 8 mmHg, the estimated right ventricular systolic pressure is 24.2 mmHg. Left Atrium: Left atrial size was normal  in size. Right Atrium: Right atrial size was normal in size. Pericardium: There is no evidence of pericardial effusion. Mitral Valve: The mitral valve is grossly normal. There is mild thickening of the mitral valve leaflet(s). Trivial mitral valve regurgitation. MV peak gradient, 4.7 mmHg. The mean mitral valve gradient is 2.0 mmHg. Tricuspid Valve: The tricuspid valve is normal in structure. Tricuspid valve regurgitation is trivial. Aortic Valve: The aortic valve is tricuspid. There is mild calcification of the aortic valve. There is mild thickening of the aortic valve. Aortic valve regurgitation is not visualized. Aortic valve sclerosis/calcification is present, without any evidence of aortic stenosis. Aortic valve mean gradient measures 7.0 mmHg. Aortic valve peak gradient measures 14.8 mmHg. Aortic valve area, by VTI measures 2.17 cm. Pulmonic Valve: The pulmonic  valve was normal in structure. Pulmonic valve regurgitation is trivial. Aorta: The aortic root is normal in size and structure. Venous: The inferior vena cava is dilated in size with greater than 50% respiratory variability, suggesting right atrial pressure of 8 mmHg. IAS/Shunts: The atrial septum is grossly normal.  LEFT VENTRICLE PLAX 2D LVIDd:         4.30 cm   Diastology LVIDs:         2.40 cm   LV e' medial:    6.85 cm/s LV PW:         1.30 cm   LV E/e' medial:  15.0 LV IVS:        1.20 cm   LV e' lateral:   10.00 cm/s LVOT diam:     2.00 cm   LV E/e' lateral: 10.3 LV SV:         75 LV SV Index:   40 LVOT Area:     3.14 cm  RIGHT VENTRICLE RV Basal diam:  3.20 cm RV Mid diam:    2.60 cm RV S prime:     17.60 cm/s TAPSE (M-mode): 2.5 cm LEFT ATRIUM             Index        RIGHT ATRIUM           Index LA diam:        3.60 cm 1.93 cm/m   RA Area:     15.40 cm LA Vol (A2C):   57.4 ml 30.71 ml/m  RA Volume:   36.00 ml  19.26 ml/m LA Vol (A4C):   53.3 ml 28.51 ml/m LA Biplane Vol: 58.3 ml 31.19 ml/m  AORTIC VALVE                     PULMONIC  VALVE AV Area (Vmax):    2.20 cm      PV Vmax:       1.23 m/s AV Area (Vmean):   2.23 cm      PV Peak grad:  6.1 mmHg AV Area (VTI):     2.17 cm AV Vmax:           192.67 cm/s AV Vmean:          124.667 cm/s AV VTI:            0.348 m AV Peak Grad:      14.8 mmHg AV Mean Grad:      7.0 mmHg LVOT Vmax:         135.00 cm/s LVOT Vmean:        88.600 cm/s LVOT VTI:          0.240 m LVOT/AV VTI ratio: 0.69  AORTA Ao Root diam: 2.60 cm Ao Asc diam:  2.90 cm MITRAL VALVE                TRICUSPID VALVE MV Area (PHT): 4.29 cm     TR Peak grad:   32.3 mmHg MV Area VTI:   2.34 cm     TR Vmax:        284.00 cm/s MV Peak grad:  4.7 mmHg MV Mean grad:  2.0 mmHg     SHUNTS MV Vmax:       1.08 m/s     Systemic VTI:  0.24 m MV Vmean:      71.0 cm/s    Systemic Diam: 2.00 cm MV Decel Time: 177 msec MV E velocity: 103.00 cm/s  MV A velocity: 108.00 cm/s MV E/A ratio:  0.95 Gwyndolyn Kaufman MD Electronically signed by Gwyndolyn Kaufman MD Signature Date/Time: 06/02/2022/10:48:34 AM    Final    CT Angio Chest PE W and/or Wo Contrast  Result Date: 06/01/2022 CLINICAL DATA:  Shortness of breath, concern for pulmonary embolism. EXAM: CT ANGIOGRAPHY CHEST WITH CONTRAST TECHNIQUE: Multidetector CT imaging of the chest was performed using the standard protocol during bolus administration of intravenous contrast. Multiplanar CT image reconstructions and MIPs were obtained to evaluate the vascular anatomy. RADIATION DOSE REDUCTION: This exam was performed according to the departmental dose-optimization program which includes automated exposure control, adjustment of the mA and/or kV according to patient size and/or use of iterative reconstruction technique. CONTRAST:  33m OMNIPAQUE IOHEXOL 350 MG/ML SOLN COMPARISON:  Chest radiograph performed the same day. FINDINGS: Cardiovascular: Satisfactory opacification of the pulmonary arteries to the segmental level. There is a questionable filling defect in a subsegmental pulmonary artery  supplying the left lower lobe (series 5, image [id number]). Vascular calcifications are seen in the aortic arch. Normal heart size. No pericardial effusion. Mediastinum/Nodes: No enlarged mediastinal, hilar, or axillary lymph nodes. Thyroid gland, trachea, and esophagus demonstrate no significant findings. Lungs/Pleura: Lungs are clear. No pleural effusion or pneumothorax. Upper Abdomen: No acute abnormality. Musculoskeletal: Degenerative changes are seen in the spine. Review of the MIP images confirms the above findings. IMPRESSION: 1. Questionable small filling defect in a subsegmental pulmonary artery supplying the left lower lobe may represent a pulmonary embolism. If it would help direct clinical management, a follow-up CT PE protocol could be performed in 24 hours to confirm the presence/absence of this finding. Aortic Atherosclerosis (ICD10-I70.0). Electronically Signed   By: TZerita BoersM.D.   On: 06/01/2022 18:44   DG Chest Port 1 View  Result Date: 06/01/2022 CLINICAL DATA:  Shortness of breath EXAM: PORTABLE CHEST 1 VIEW COMPARISON:  None Available. FINDINGS: The heart size and mediastinal contours are within normal limits. Both lungs are clear. Degenerative joint changes of bilateral shoulders are noted. Scoliosis of spine noted. IMPRESSION: No active disease. Electronically Signed   By: WAbelardo DieselM.D.   On: 06/01/2022 15:32    Scheduled Meds:  aspirin EC  81 mg Oral Daily   Chlorhexidine Gluconate Cloth  6 each Topical Q0600   dextromethorphan-guaiFENesin  2 tablet Oral BID   losartan  100 mg Oral Daily   And   hydrochlorothiazide  25 mg Oral Daily   ipratropium-albuterol  3 mL Nebulization QID   ipratropium-albuterol       loratadine  10 mg Oral Daily   melatonin  6 mg Oral QHS   methylPREDNISolone (SOLU-MEDROL) injection  125 mg Intravenous Q12H   pantoprazole  40 mg Oral BID AC   [START ON 06/03/2022] pneumococcal 20-valent conjugate vaccine  0.5 mL Intramuscular Tomorrow-1000    rosuvastatin  5 mg Oral Once per day on Sun Thu   Continuous Infusions:  heparin 850 Units/hr (06/02/22 1510)     LOS: 1 day   Critical Care Procedure Note Authorized and Performed by: CMurvin NatalMD  Total Critical Care time:  50 mins Due to a high probability of clinically significant, life threatening deterioration, the patient required my highest level of preparedness to intervene emergently and I personally spent this critical care time directly and personally managing the patient.  This critical care time included obtaining a history; examining the patient, pulse oximetry; ordering and review of studies; arranging urgent treatment with development of a management plan;  evaluation of patient's response of treatment; frequent reassessment; and discussions with other providers.  This critical care time was performed to assess and manage the high probability of imminent and life threatening deterioration that could result in multi-organ failure.  It was exclusive of separately billable procedures and treating other patients and teaching time.    Irwin Brakeman, MD How to contact the [facility] Attending or Consulting provider [place] or covering provider during after hours [place], for this patient?  Check the care team in [facility], [facility], [facility] and look for a) attending/consulting [facility] provider listed and b) the [facility] team listed Log into [email or web address] and use [place]'s universal password to access. If you do not have the password, please contact the hospital operator. Locate the [facility] provider you are looking for under [facility] and page to a number that you can be directly reached. If you still have difficulty reaching the provider, please page the [facility] (Director on Call) for the Hospitalists listed on amion for assistance.  06/02/2022, 5:55 PM

## 2022-06-02 NOTE — Hospital Course (Signed)
76 y.o. female with medical history significant of hypertension and controlled asthma who presents to ED with a chief complaint of dyspnea.  Patient reportedly was quite short of breath that hesitation, only able to speak in 2-3 word phrases, and tripoding.  At the time of my exam patient is no longer tripoding, but still cannot complete a sentence without having to close her eyes and focus on breathing.  She has an audible wheeze from the doorway.  Due to her work of breathing, history is quite limited.  She reports that her dyspnea was gradual in onset and progressively worsening since last night.  She was not able to lay flat at all last night.  She has not noticed any swelling in her legs.  She has no history of CHF/fluid overload.  Her dyspnea was also worse on exertion.  It became acutely worse in the middle of the night last night.  She denies chest pain.  Her sister, at bedside, provides some extra history.  She reports that patient has recently had family members staying in her home from [place] places including [place], [place], [place].

## 2022-06-02 NOTE — Progress Notes (Signed)
*  PRELIMINARY RESULTS* Echocardiogram 2D Echocardiogram has been performed.  Tammy [name] 06/02/2022, 10:27 AM

## 2022-06-03 ENCOUNTER — Telehealth: Payer: Self-pay

## 2022-06-03 DIAGNOSIS — I2693 Single subsegmental pulmonary embolism without acute cor pulmonale: Secondary | ICD-10-CM

## 2022-06-03 DIAGNOSIS — R778 Other specified abnormalities of plasma proteins: Secondary | ICD-10-CM | POA: Diagnosis not present

## 2022-06-03 DIAGNOSIS — J9601 Acute respiratory failure with hypoxia: Secondary | ICD-10-CM | POA: Diagnosis not present

## 2022-06-03 DIAGNOSIS — I10 Essential (primary) hypertension: Secondary | ICD-10-CM | POA: Diagnosis not present

## 2022-06-03 DIAGNOSIS — J45901 Unspecified asthma with (acute) exacerbation: Secondary | ICD-10-CM | POA: Diagnosis not present

## 2022-06-03 LAB — CBC
HCT: 39.8 % (ref 36.0–46.0)
Hemoglobin: 12.6 g/dL (ref 12.0–15.0)
MCH: 25.6 pg — ABNORMAL LOW (ref 26.0–34.0)
MCHC: 31.7 g/dL (ref 30.0–36.0)
MCV: 80.7 fL (ref 80.0–100.0)
Platelets: 322 10*3/uL (ref 150–400)
RBC: 4.93 MIL/uL (ref 3.87–5.11)
RDW: 14.9 % (ref 11.5–15.5)
WBC: 25.8 10*3/uL — ABNORMAL HIGH (ref 4.0–10.5)
nRBC: 0 % (ref 0.0–0.2)

## 2022-06-03 LAB — BASIC METABOLIC PANEL
Anion gap: 8 (ref 5–15)
BUN: 32 mg/dL — ABNORMAL HIGH (ref 8–23)
CO2: 22 mmol/L (ref 22–32)
Calcium: 9.5 mg/dL (ref 8.9–10.3)
Chloride: 105 mmol/L (ref 98–111)
Creatinine, Ser: 1.13 mg/dL — ABNORMAL HIGH (ref 0.44–1.00)
GFR, Estimated: 50 mL/min — ABNORMAL LOW (ref >60)
Glucose, Bld: 163 mg/dL — ABNORMAL HIGH (ref 70–99)
Potassium: 4.5 mmol/L (ref 3.5–5.1)
Sodium: 135 mmol/L (ref 135–145)

## 2022-06-03 LAB — HEPARIN LEVEL (UNFRACTIONATED): Heparin Unfractionated: 0.67 IU/mL (ref 0.30–0.70)

## 2022-06-03 LAB — LIPID PANEL
Cholesterol: 177 mg/dL (ref 0–200)
HDL: 52 mg/dL (ref >40)
LDL Cholesterol: 109 mg/dL — ABNORMAL HIGH (ref 0–99)
Total CHOL/HDL Ratio: 3.4 RATIO
Triglycerides: 79 mg/dL (ref <150)
VLDL: 16 mg/dL (ref 0–40)

## 2022-06-03 LAB — MAGNESIUM: Magnesium: 2.4 mg/dL (ref 1.7–2.4)

## 2022-06-03 LAB — TROPONIN I (HIGH SENSITIVITY): Troponin I (High Sensitivity): 1034 ng/L (ref <18)

## 2022-06-03 MED ORDER — METHYLPREDNISOLONE SODIUM SUCC 125 MG IJ SOLR
80.0000 mg | Freq: Two times a day (BID) | INTRAMUSCULAR | Status: DC
  Administered 2022-06-04: 80 mg via INTRAVENOUS
  Filled 2022-06-03: qty 2

## 2022-06-03 MED ORDER — MONTELUKAST SODIUM 10 MG PO TABS
10.0000 mg | ORAL_TABLET | Freq: Every day | ORAL | Status: DC
  Administered 2022-06-03 – 2022-06-08 (×6): 10 mg via ORAL
  Filled 2022-06-03 (×6): qty 1

## 2022-06-03 MED ORDER — BENZONATATE 100 MG PO CAPS
200.0000 mg | ORAL_CAPSULE | Freq: Three times a day (TID) | ORAL | Status: DC
Start: 2022-06-03 — End: 2022-06-06
  Administered 2022-06-03 – 2022-06-06 (×9): 200 mg via ORAL
  Filled 2022-06-03 (×10): qty 2

## 2022-06-03 MED ORDER — HYDROCOD POLI-CHLORPHE POLI ER 10-8 MG/5ML PO SUER
5.0000 mL | Freq: Four times a day (QID) | ORAL | Status: DC
  Administered 2022-06-03 – 2022-06-06 (×12): 5 mL via ORAL
  Filled 2022-06-03 (×12): qty 5

## 2022-06-03 NOTE — Progress Notes (Addendum)
NAME:  Tammy [name], MRN:  [id number], DOB:  20-Oct-1946, LOS: 2 ADMISSION DATE:  06/01/2022, CONSULTATION DATE:  06/02/22 REFERRING MD:  [facility] , CHIEF COMPLAINT:  sob    History of Present Illness:   76 y.o. female remote smoker with medical history significant of hypertension and controlled asthma who presents to ED with a chief complaint of dyspnea and severe dry coughing fits.  .  She reports that her dyspnea was gradual in onset and progressively worsening since night PTA >>  not able to lie  flat s noting  any swelling in her legs.  She has no history of CHF/fluid overload.  Her dyspnea was also worse on exertion.   She denied chest pain.     Baseline = "Fine until July 4"  fully active, rare need for saba no problem with nasal symptoms, no uri or overt GERD symptoms, cp, sorethroat or dysphagia then "just could not quit coughing to point of choking onset July 4 and not better with saba hfa and no better since admit on DPI's   7/7 additional hx :  when mentioned this was a throat problem and needed ent eval, disclosed that she had seen [facility] for similar issues but "it's been a minute"  > last office note requested: Last ov 09/18/18  Dx: severe LPR, worse off GERD rx so rec continue gerd rx indefinitely and f/u q 6 m (not done)       Significant Hospital Events: Including procedures, antibiotic start and stop dates in addition to other pertinent events   CTa 7/5: small filling defect in a subsegmental pulmonary artery supplying the left lower lobe -  Venous dopplers 7/6 neg bilaterally    Scheduled Meds:  aspirin EC  81 mg Oral Daily   Chlorhexidine Gluconate Cloth  6 each Topical Q0600   dextromethorphan-guaiFENesin  2 tablet Oral BID   ipratropium-albuterol  3 mL Nebulization QID   loratadine  10 mg Oral Daily   melatonin  6 mg Oral QHS   methylPREDNISolone (SOLU-MEDROL) injection  125 mg Intravenous Q12H   pantoprazole  40 mg Oral BID AC   pneumococcal 20-valent  conjugate vaccine  0.5 mL Intramuscular Tomorrow-1000   rosuvastatin  5 mg Oral Once per day on Sun Thu   Continuous Infusions:  heparin 850 Units/hr (06/03/22 0352)   PRN Meds:.albuterol, benzonatate, morphine injection, ondansetron **OR** ondansetron (ZOFRAN) IV, mouth rinse, oxyCODONE    Interim History / Subjective:  Severe dry coughing fits with voice use, 4 visitors in room. Slept fine for 4 hours after oxycodone rx but then woke up and started coughing again    Objective   Blood pressure (!) 111/58, pulse 84, temperature 97.9 F (36.6 C), temperature source Oral, resp. rate (!) 21, height '5\' 5"'$  (1.651 m), weight 78.9 kg, SpO2 98 %.        Intake/Output Summary (Last 24 hours) at 06/03/2022 1008 Last data filed at 06/03/2022 0900 Gross per 24 hour  Intake 481.94 ml  Output 900 ml  Net -418.06 ml   Filed Weights   06/01/22 1921 06/01/22 2200 06/03/22 0500  Weight: 79.4 kg 79.4 kg 78.9 kg    Examination:  Tmax:  98 General appearance:   elderly bm with harsh coughing fits heard across the unit  At Rest 02 sats  98% on 3lpm   No jvd Oropharynx clear,  mucosa nl Neck supple Lungs with classic psuedowheeze/ coughing on insp maneuvers / not using flutter at bedtside  RRR no s3 or or sign murmur Abd soft with nl excursion  Extr warm with no edema or clubbing noted Neuro  Sensorium intact ,  no apparent motor deficits      Assessment & Plan:  1)  Acute hypoxemic resp failure in setting of ? Subsegmental PE and severe upper airway coughing ? Etiology   DDX of  difficult airways management almost all start with A and  include Adherence, Ace Inhibitors, Acid Reflux, Active Sinus Disease, Alpha 1 Antitripsin deficiency, Anxiety masquerading as Airways dz,  ABPA,  Allergy(esp in young), Aspiration (esp in elderly), Adverse effects of meds,  Active smoking or vaping, A bunch of PE's (a small clot burden can't cause this syndrome unless there is already severe underlying pulm or  vascular dz with poor reserve) plus two Bs  = Bronchiectasis and Beta blocker use..and one C= CHF  ? Acid (or non-acid) GERD > always difficult to exclude as up to 75% of pts in some series report no assoc GI/ Heartburn symptoms> rec max (24h)  acid suppression    ? Adverse effects of meds, esp DPI   > stop all dpi   ? Allergy /asthma > steroids and nebs only  ? Acute/ active sinus dz > nothing by hx to suggest / requested Dr Deeann Saint notes   ? A bunch of PE's > doubt this one subsegmental lesion is responsible for her symptoms> rx per [facility]   ? Chf/ cardiac asthma > bnp of less than 100 assuring in this setting    2) Upper airway cough syndrome/ cyclical cough - way underdosing oxycodone "prn" so will start tussionex 1 tsp q 6 h (twice nl dose) and supplement prn with oxydoone / instructed on flutter valve rx   Best Practice (right click and "Reselect all SmartList Selections" daily)    Per [facility]  Labs   CBC: Recent Labs  Lab 06/01/22 1514 06/02/22 0334 06/03/22 0411  WBC 19.0* 14.5* 25.8*  NEUTROABS 14.0*  --   --   HGB 13.9 12.6 12.6  HCT 44.4 39.6 39.8  MCV 80.3 80.2 80.7  PLT 364 336 572    Basic Metabolic Panel: Recent Labs  Lab 06/01/22 1514 06/02/22 0334 06/03/22 0411  NA 138 136 135  K 3.7 3.1* 4.5  CL 101 104 105  CO2 '27 23 22  '$ GLUCOSE 167* 179* 163*  BUN 12 15 32*  CREATININE 0.81 0.90 1.13*  CALCIUM 9.5 9.3 9.5  MG  --  2.2 2.4   GFR: Estimated Creatinine Clearance: 42.6 mL/min (A) (by C-G formula based on SCr of 1.13 mg/dL (H)). Recent Labs  Lab 06/01/22 1514 06/01/22 2011 06/02/22 0334 06/03/22 0411  PROCALCITON  --  <0.10  --   --   WBC 19.0*  --  14.5* 25.8*    Liver Function Tests: Recent Labs  Lab 06/01/22 1514 06/02/22 0334  AST 36 42*  ALT 28 28  ALKPHOS 45 37*  BILITOT 0.7 0.3  PROT 8.2* 7.5  ALBUMIN 4.6 4.0   No results for input(s): "LIPASE", "AMYLASE" in the last 168 hours. No results for input(s): "AMMONIA" in the  last 168 hours.  ABG    Component Value Date/Time   HCO3 24.2 06/01/2022 2012   ACIDBASEDEF 0.7 06/01/2022 2012   O2SAT 86.1 06/01/2022 2012     Coagulation Profile: No results for input(s): "INR", "PROTIME" in the last 168 hours.  Cardiac Enzymes: No results for input(s): "CKTOTAL", "CKMB", "CKMBINDEX", "TROPONINI" in the last 168  hours.  HbA1C: No results found for: "HGBA1C"  CBG: No results for input(s): "GLUCAP" in the last 168 hours.      Christinia Gully, MD [facility] [phone]   After 7:00 pm call Elink  [phone]

## 2022-06-03 NOTE — Progress Notes (Addendum)
Progress Note  Patient Name: Tammy [name] Date of Encounter: 06/03/2022  Primary Cardiologist: Kate Sable, MD (Inactive)  Subjective   Coughing less frequently but still present and cannot speak more than a few words without triggering this. Still quite wheezy.   Inpatient Medications    Scheduled Meds:  aspirin EC  81 mg Oral Daily   Chlorhexidine Gluconate Cloth  6 each Topical Q0600   dextromethorphan-guaiFENesin  2 tablet Oral BID   losartan  100 mg Oral Daily   And   hydrochlorothiazide  25 mg Oral Daily   ipratropium-albuterol  3 mL Nebulization QID   loratadine  10 mg Oral Daily   melatonin  6 mg Oral QHS   methylPREDNISolone (SOLU-MEDROL) injection  125 mg Intravenous Q12H   pantoprazole  40 mg Oral BID AC   pneumococcal 20-valent conjugate vaccine  0.5 mL Intramuscular Tomorrow-1000   rosuvastatin  5 mg Oral Once per day on Sun Thu   Continuous Infusions:  heparin 850 Units/hr (06/03/22 0352)   PRN Meds: albuterol, benzonatate, morphine injection, ondansetron **OR** ondansetron (ZOFRAN) IV, mouth rinse, oxyCODONE   Vital Signs    Vitals:   06/03/22 0455 06/03/22 0500 06/03/22 0750 06/03/22 0800  BP:      Pulse:    84  Resp:    (!) 21  Temp:   97.9 F (36.6 C) 97.9 F (36.6 C)  TempSrc:   Oral Oral  SpO2: 100%   98%  Weight:  78.9 kg    Height:        Intake/Output Summary (Last 24 hours) at 06/03/2022 0842 Last data filed at 06/03/2022 0352 Gross per 24 hour  Intake 361.94 ml  Output 900 ml  Net -538.06 ml      06/03/2022    5:00 AM 06/01/2022   10:00 PM 06/01/2022    7:21 PM  Last 3 Weights  Weight (lbs) 173 lb 15.1 oz 175 lb 0.7 oz 175 lb  Weight (kg) 78.9 kg 79.4 kg 79.379 kg     Telemetry    NSR/Sinus tach, rare sinus pause 1.5 sec or less, rare PVCs and one couplet, brief atrial tach yesterday afternoon - Personally Reviewed  Physical Exam   GEN: No acute distress.  HEENT: Normocephalic, atraumatic, sclera non-icteric. Neck: No  JVD or bruits. Cardiac: RRR no murmurs, rubs, or gallops.  Respiratory: Diffusely wheezy/rhonchorous, less WOB today than yesterday but still coughs easily with speaking GI: Soft, nontender, non-distended, BS +x 4. MS: no deformity. Extremities: No clubbing or cyanosis. No edema. Distal pedal pulses are 2+ and equal bilaterally. Neuro:  AAOx3. Follows commands. Psych:  Responds to questions appropriately with a normal affect.  Labs    High Sensitivity Troponin:   Recent Labs  Lab 06/01/22 1650 06/01/22 2011 06/02/22 0334 06/02/22 0839 06/03/22 0411  TROPONINIHS 270* 847* 2,083* 2,438* 1,034*      Cardiac EnzymesNo results for input(s): "TROPONINI" in the last 168 hours. No results for input(s): "TROPIPOC" in the last 168 hours.   Chemistry Recent Labs  Lab 06/01/22 1514 06/02/22 0334 06/03/22 0411  NA 138 136 135  K 3.7 3.1* 4.5  CL 101 104 105  CO2 '27 23 22  '$ GLUCOSE 167* 179* 163*  BUN 12 15 32*  CREATININE 0.81 0.90 1.13*  CALCIUM 9.5 9.3 9.5  PROT 8.2* 7.5  --   ALBUMIN 4.6 4.0  --   AST 36 42*  --   ALT 28 28  --   ALKPHOS 45 37*  --  BILITOT 0.7 0.3  --   GFRNONAA >60 >60 50*  ANIONGAP '10 9 8     '$ Hematology Recent Labs  Lab 06/01/22 1514 06/02/22 0334 06/03/22 0411  WBC 19.0* 14.5* 25.8*  RBC 5.53* 4.94 4.93  HGB 13.9 12.6 12.6  HCT 44.4 39.6 39.8  MCV 80.3 80.2 80.7  MCH 25.1* 25.5* 25.6*  MCHC 31.3 31.8 31.7  RDW 14.3 14.8 14.9  PLT 364 336 322    BNP Recent Labs  Lab 06/01/22 1514  BNP 93.0     DDimer No results for input(s): "DDIMER" in the last 168 hours.   Radiology    CT Angio Chest Pulmonary Embolism (PE) W or WO Contrast  Result Date: 06/02/2022 CLINICAL DATA:  Pulmonary embolism (PE) suspected, high prob. Repeat filling defect from CTA Chest on 06/01/2022, patient c/o shortness of breath EXAM: CT ANGIOGRAPHY CHEST WITH CONTRAST TECHNIQUE: Multidetector CT imaging of the chest was performed using the standard protocol during  bolus administration of intravenous contrast. Multiplanar CT image reconstructions and MIPs were obtained to evaluate the vascular anatomy. RADIATION DOSE REDUCTION: This exam was performed according to the departmental dose-optimization program which includes automated exposure control, adjustment of the mA and/or kV according to patient size and/or use of iterative reconstruction technique. CONTRAST:  154m OMNIPAQUE IOHEXOL 350 MG/ML SOLN COMPARISON:  CT angio chest 06/01/2022 FINDINGS: Cardiovascular: Preferential opacification of the thoracic aorta. Fair opacification of the pulmonary arteries to the proximal segmental level. No evidence of pulmonary embolism. Normal heart size. No significant pericardial effusion. No thoracic aorta dissection. The thoracic aorta is normal in caliber. Mild atherosclerotic plaque of the thoracic aorta. Three-vessel coronary artery calcifications. Mediastinum/Nodes: No enlarged mediastinal, hilar, or axillary lymph nodes. Thyroid gland, trachea, and esophagus demonstrate no significant findings. Lungs/Pleura: Diffuse bronchial wall thickening. No focal consolidation. No pulmonary nodule. No pulmonary mass. No pleural effusion. No pneumothorax. Upper Abdomen: No acute abnormality. Musculoskeletal: No chest wall abnormality. Right shoulder rotator cuff anchor suture. No suspicious lytic or blastic osseous lesions. No acute displaced fracture. Multilevel degenerative changes of the spine. Review of the MIP images confirms the above findings. IMPRESSION: 1. No central or proximal segmental pulmonary embolus. Markedly limited evaluation more distally due to timing of contrast. Unable to evaluate previously question left lower lobe pulmonary embolus. 2. No acute aortic abnormality. 3. Diffuse bronchial wall thickening consistent with smoking-related small airway disease. 4. Aortic Atherosclerosis (ICD10-I70.0) including three-vessel coronary calcification. Electronically Signed   By:  MIven FinnM.D.   On: 06/02/2022 18:33   UKoreaVenous Img Lower Bilateral (DVT)  Result Date: 06/02/2022 CLINICAL DATA:  Shortness of breath.  Evaluate for DVT. EXAM: BILATERAL LOWER EXTREMITY VENOUS DOPPLER ULTRASOUND TECHNIQUE: Gray-scale sonography with graded compression, as well as color Doppler and duplex ultrasound were performed to evaluate the lower extremity deep venous systems from the level of the common femoral vein and including the common femoral, femoral, profunda femoral, popliteal and calf veins including the posterior tibial, peroneal and gastrocnemius veins when visible. The superficial great saphenous vein was also interrogated. Spectral Doppler was utilized to evaluate flow at rest and with distal augmentation maneuvers in the common femoral, femoral and popliteal veins. COMPARISON:  Right lower extremity venous Doppler ultrasound-06/29/2006 (negative). FINDINGS: RIGHT LOWER EXTREMITY Common Femoral Vein: No evidence of thrombus. Normal compressibility, respiratory phasicity and response to augmentation. Saphenofemoral Junction: No evidence of thrombus. Normal compressibility and flow on color Doppler imaging. Profunda Femoral Vein: No evidence of thrombus. Normal compressibility and flow on  color Doppler imaging. Femoral Vein: No evidence of thrombus. Normal compressibility, respiratory phasicity and response to augmentation. Popliteal Vein: No evidence of thrombus. Normal compressibility, respiratory phasicity and response to augmentation. Calf Veins: No evidence of thrombus. Normal compressibility and flow on color Doppler imaging. Superficial Great Saphenous Vein: No evidence of thrombus. Normal compressibility. Other Findings:  None. LEFT LOWER EXTREMITY Common Femoral Vein: No evidence of thrombus. Normal compressibility, respiratory phasicity and response to augmentation. Saphenofemoral Junction: No evidence of thrombus. Normal compressibility and flow on color Doppler imaging.  Profunda Femoral Vein: No evidence of thrombus. Normal compressibility and flow on color Doppler imaging. Femoral Vein: No evidence of thrombus. Normal compressibility, respiratory phasicity and response to augmentation. Popliteal Vein: No evidence of thrombus. Normal compressibility, respiratory phasicity and response to augmentation. Calf Veins: No evidence of thrombus. Normal compressibility and flow on color Doppler imaging. Superficial Great Saphenous Vein: No evidence of thrombus. Normal compressibility. Other Findings:  None. IMPRESSION: No evidence of DVT within either lower extremity. Electronically Signed   By: Sandi Mariscal M.D.   On: 06/02/2022 11:55   ECHOCARDIOGRAM COMPLETE  Result Date: 06/02/2022    ECHOCARDIOGRAM REPORT   Patient Name:   Tammy [name] Date of Exam: 06/02/2022 Medical Rec #:  [id number]      Height:       65.0 in Accession #:    [id number]     Weight:       175.0 lb Date of Birth:  1946-10-27      BSA:          1.869 m Patient Age:    40 years       BP:           133/72 mmHg Patient Gender: F              HR:           102 bpm. Exam Location:  [facility] Procedure: 2D Echo, Cardiac Doppler and Color Doppler Indications:    Pulmonary embolus  History:        Patient has no prior history of Echocardiogram examinations.                 Risk Factors:Hypertension and Dyslipidemia.  Sonographer:    Wenda Low Referring Phys: [id number] ASIA B [place]  1. Left ventricular ejection fraction, by estimation, is 70 to 75%. The left ventricle has hyperdynamic function. The left ventricle has no regional wall motion abnormalities. There is moderate hypertrophy of the basal septal segment. The rest of the LV  segments demonstrate mild left ventricular hypertrophy. Left ventricular diastolic parameters are consistent with Grade I diastolic dysfunction (impaired relaxation).  2. Right ventricular systolic function is normal. The right ventricular size is normal. There is mildly  elevated pulmonary artery systolic pressure. The estimated right ventricular systolic pressure is 49.6 mmHg.  3. The mitral valve is grossly normal. Trivial mitral valve regurgitation.  4. The aortic valve is tricuspid. There is mild calcification of the aortic valve. There is mild thickening of the aortic valve. Aortic valve regurgitation is not visualized. Aortic valve sclerosis/calcification is present, without any evidence of aortic stenosis.  5. The inferior vena cava is dilated in size with >50% respiratory variability, suggesting right atrial pressure of 8 mmHg. Comparison(s): No prior Echocardiogram. FINDINGS  Left Ventricle: Left ventricular ejection fraction, by estimation, is 70 to 75%. The left ventricle has hyperdynamic function. The left ventricle has no regional wall motion abnormalities. The left ventricular internal  cavity size was normal in size. There is moderate hypertrophy of the basal septal segment. The rest of the LV segments demonstrate mild left ventricular hypertrophy. Left ventricular diastolic parameters are consistent with Grade I diastolic dysfunction (impaired relaxation). Right Ventricle: The right ventricular size is normal. No increase in right ventricular wall thickness. Right ventricular systolic function is normal. There is mildly elevated pulmonary artery systolic pressure. The tricuspid regurgitant velocity is 2.84  m/s, and with an assumed right atrial pressure of 8 mmHg, the estimated right ventricular systolic pressure is 16.1 mmHg. Left Atrium: Left atrial size was normal in size. Right Atrium: Right atrial size was normal in size. Pericardium: There is no evidence of pericardial effusion. Mitral Valve: The mitral valve is grossly normal. There is mild thickening of the mitral valve leaflet(s). Trivial mitral valve regurgitation. MV peak gradient, 4.7 mmHg. The mean mitral valve gradient is 2.0 mmHg. Tricuspid Valve: The tricuspid valve is normal in structure. Tricuspid  valve regurgitation is trivial. Aortic Valve: The aortic valve is tricuspid. There is mild calcification of the aortic valve. There is mild thickening of the aortic valve. Aortic valve regurgitation is not visualized. Aortic valve sclerosis/calcification is present, without any evidence of aortic stenosis. Aortic valve mean gradient measures 7.0 mmHg. Aortic valve peak gradient measures 14.8 mmHg. Aortic valve area, by VTI measures 2.17 cm. Pulmonic Valve: The pulmonic valve was normal in structure. Pulmonic valve regurgitation is trivial. Aorta: The aortic root is normal in size and structure. Venous: The inferior vena cava is dilated in size with greater than 50% respiratory variability, suggesting right atrial pressure of 8 mmHg. IAS/Shunts: The atrial septum is grossly normal.  LEFT VENTRICLE PLAX 2D LVIDd:         4.30 cm   Diastology LVIDs:         2.40 cm   LV e' medial:    6.85 cm/s LV PW:         1.30 cm   LV E/e' medial:  15.0 LV IVS:        1.20 cm   LV e' lateral:   10.00 cm/s LVOT diam:     2.00 cm   LV E/e' lateral: 10.3 LV SV:         75 LV SV Index:   40 LVOT Area:     3.14 cm  RIGHT VENTRICLE RV Basal diam:  3.20 cm RV Mid diam:    2.60 cm RV S prime:     17.60 cm/s TAPSE (M-mode): 2.5 cm LEFT ATRIUM             Index        RIGHT ATRIUM           Index LA diam:        3.60 cm 1.93 cm/m   RA Area:     15.40 cm LA Vol (A2C):   57.4 ml 30.71 ml/m  RA Volume:   36.00 ml  19.26 ml/m LA Vol (A4C):   53.3 ml 28.51 ml/m LA Biplane Vol: 58.3 ml 31.19 ml/m  AORTIC VALVE                     PULMONIC VALVE AV Area (Vmax):    2.20 cm      PV Vmax:       1.23 m/s AV Area (Vmean):   2.23 cm      PV Peak grad:  6.1 mmHg AV Area (VTI):     2.17  cm AV Vmax:           192.67 cm/s AV Vmean:          124.667 cm/s AV VTI:            0.348 m AV Peak Grad:      14.8 mmHg AV Mean Grad:      7.0 mmHg LVOT Vmax:         135.00 cm/s LVOT Vmean:        88.600 cm/s LVOT VTI:          0.240 m LVOT/AV VTI ratio: 0.69   AORTA Ao Root diam: 2.60 cm Ao Asc diam:  2.90 cm MITRAL VALVE                TRICUSPID VALVE MV Area (PHT): 4.29 cm     TR Peak grad:   32.3 mmHg MV Area VTI:   2.34 cm     TR Vmax:        284.00 cm/s MV Peak grad:  4.7 mmHg MV Mean grad:  2.0 mmHg     SHUNTS MV Vmax:       1.08 m/s     Systemic VTI:  0.24 m MV Vmean:      71.0 cm/s    Systemic Diam: 2.00 cm MV Decel Time: 177 msec MV E velocity: 103.00 cm/s MV A velocity: 108.00 cm/s MV E/A ratio:  0.95 Gwyndolyn Kaufman MD Electronically signed by Gwyndolyn Kaufman MD Signature Date/Time: 06/02/2022/10:48:34 AM    Final    CT Angio Chest PE W and/or Wo Contrast  Result Date: 06/01/2022 CLINICAL DATA:  Shortness of breath, concern for pulmonary embolism. EXAM: CT ANGIOGRAPHY CHEST WITH CONTRAST TECHNIQUE: Multidetector CT imaging of the chest was performed using the standard protocol during bolus administration of intravenous contrast. Multiplanar CT image reconstructions and MIPs were obtained to evaluate the vascular anatomy. RADIATION DOSE REDUCTION: This exam was performed according to the departmental dose-optimization program which includes automated exposure control, adjustment of the mA and/or kV according to patient size and/or use of iterative reconstruction technique. CONTRAST:  18m OMNIPAQUE IOHEXOL 350 MG/ML SOLN COMPARISON:  Chest radiograph performed the same day. FINDINGS: Cardiovascular: Satisfactory opacification of the pulmonary arteries to the segmental level. There is a questionable filling defect in a subsegmental pulmonary artery supplying the left lower lobe (series 5, image [id number]). Vascular calcifications are seen in the aortic arch. Normal heart size. No pericardial effusion. Mediastinum/Nodes: No enlarged mediastinal, hilar, or axillary lymph nodes. Thyroid gland, trachea, and esophagus demonstrate no significant findings. Lungs/Pleura: Lungs are clear. No pleural effusion or pneumothorax. Upper Abdomen: No acute abnormality.  Musculoskeletal: Degenerative changes are seen in the spine. Review of the MIP images confirms the above findings. IMPRESSION: 1. Questionable small filling defect in a subsegmental pulmonary artery supplying the left lower lobe may represent a pulmonary embolism. If it would help direct clinical management, a follow-up CT PE protocol could be performed in 24 hours to confirm the presence/absence of this finding. Aortic Atherosclerosis (ICD10-I70.0). Electronically Signed   By: TZerita BoersM.D.   On: 06/01/2022 18:44   DG Chest Port 1 View  Result Date: 06/01/2022 CLINICAL DATA:  Shortness of breath EXAM: PORTABLE CHEST 1 VIEW COMPARISON:  None Available. FINDINGS: The heart size and mediastinal contours are within normal limits. Both lungs are clear. Degenerative joint changes of bilateral shoulders are noted. Scoliosis of spine noted. IMPRESSION: No active disease. Electronically Signed   By: WAbelardo Diesel  M.D.   On: 06/01/2022 15:32    Cardiac Studies   2D echo 06/02/22   1. Left ventricular ejection fraction, by estimation, is 70 to 75%. The  left ventricle has hyperdynamic function. The left ventricle has no  regional wall motion abnormalities. There is moderate hypertrophy of the  basal septal segment. The rest of the LV   segments demonstrate mild left ventricular hypertrophy. Left ventricular  diastolic parameters are consistent with Grade I diastolic dysfunction  (impaired relaxation).   2. Right ventricular systolic function is normal. The right ventricular  size is normal. There is mildly elevated pulmonary artery systolic  pressure. The estimated right ventricular systolic pressure is 06.2 mmHg.   3. The mitral valve is grossly normal. Trivial mitral valve  regurgitation.   4. The aortic valve is tricuspid. There is mild calcification of the  aortic valve. There is mild thickening of the aortic valve. Aortic valve  regurgitation is not visualized. Aortic valve  sclerosis/calcification is  present, without any evidence of  aortic stenosis.   5. The inferior vena cava is dilated in size with >50% respiratory  variability, suggesting right atrial pressure of 8 mmHg.   Comparison(s): No prior Echocardiogram.   Patient Profile     76 y.o. female with  HTN and asthma admitted with severe respiratory distress and coughing requiring supplemental O2; clinical presentation c/w asthma exacerbation. Initial CTA raised question of small filling defect, repeat CTA could not re-evaluate this area. LE venous duplex negative. Pulmonology on board as primary presentation felt pulmonary. Cardiology consulted for elevated troponin peak 2,438.  Assessment & Plan    1. Respiratory distress with shortness of breath and spastic coughing, concerned for asthma exacerbation - exam remains consistent with pulmonary decompensation - 2D echo and normal BNP argue against HF - resp pathogen panel negative including pertussis; Covid-19 swab negative upon arrival - further review of repeat CT findings/anticoag plan per primary team - remains quite wheezy - per IM/pulm  2. Elevated troponin/suspected type 2 MI - hsTroponin peaked at 2,438 in setting of physiologic demands of respiratory failure. Demand ischemia suspected but cannot fully exclude underlying CAD. Not a candidate for myoview due to wheezing (Lexiscan or exercise) - consider outpatient coronary CTA when respiratory status improved  - remains on heparin per pharmacy for the question of PE (would otherwise be completing 48 hours of heparin this PM) - continue low dose ASA - no beta blocker due to bronchospasm - LDL 109, on home statin dose 2x/week, can review plan as OP when clinically more stable - 2D echo EF 70-75%, no RWMA, mild LVH, G1DD, mildly elevated PASP (may be in the setting of resp disease), mild calcification of AV, dilated IVC   3. Essential HTN today with mild AKI, recent hypokalemia - potassium  improved, magnesium normal - Cr 0.81-> 0.90 -> 1.13 - will hold losartan and HCTZ today given rise in Cr and intermittent soft BP -> further mgmt of BP per IM  5. Rare PVCs, brief atrial tach 7/6, rare sinus pause - no sustained or clinically significant events - lytes OK - follow on telemetry    For questions or updates, please contact [facility] Please consult [email or web address] for contact info under Cardiology/STEMI.  Signed, Charlie Pitter, PA-C 06/03/2022, 8:42 AM    Personally seen and examined. Agree with above.  76 year old with severe asthma, coughing with elevated troponin peaking at 2400 compatible with type II MI (supply/demand mismatch oxygen).  Agree that after she is  over this illness, a coronary CT may be helpful with diagnosing if she has underlying CAD.  Continue with statin.  Aspirin 81 mg.  I personally reviewed and interpreted her CT scan which demonstrates some level of multivessel coronary calcification.  Study was not gated for cardiac evaluation however.  I think it makes sense to avoid Lexiscan Myoview given her bronchospasm.  Continue to hold her losartan and HCTZ given rising creatinine and intermittently soft blood pressures.  Agree.  Telemetry personally reviewed demonstrates sinus rhythm in the 90s with occasional PAC.  Candee Furbish, MD

## 2022-06-03 NOTE — Telephone Encounter (Signed)
Records received and given to Dr. Melvyn Novas. Nothing further needed at this time.

## 2022-06-03 NOTE — Progress Notes (Signed)
PROGRESS NOTE   Tammy [name]  KVQ:[id number] DOB: December 04, 1945 DOA: 06/01/2022 PCP: Celene Squibb, MD   Chief Complaint  Patient presents with   Shortness of Breath   Level of care: Stepdown  Brief Admission History:   76 y.o. female with medical history significant of hypertension and controlled asthma who presents to ED with a chief complaint of dyspnea.  Patient reportedly was quite short of breath that hesitation, only able to speak in 2-3 word phrases, and tripoding.  At the time of my exam patient is no longer tripoding, but still cannot complete a sentence without having to close her eyes and focus on breathing.  She has an audible wheeze from the doorway.  Due to her work of breathing, history is quite limited.  She reports that her dyspnea was gradual in onset and progressively worsening since last night.  She was not able to lay flat at all last night.  She has not noticed any swelling in her legs.  She has no history of CHF/fluid overload.  Her dyspnea was also worse on exertion.  It became acutely worse in the middle of the night last night.  She denies chest pain.  Her sister, at bedside, provides some extra history.  She reports that patient has recently had family members staying in her home from [place] places including [place], [place], [place].    Assessment and Plan: * Acute respiratory failure with hypoxia ([place]) - Patient has no oxygen requirement at baseline -Currently weaned down to 2 L nasal cannula to maintain oxygen sats and still has increased work of breathing - consulted to [facility] pulmonologist Dr. Melvyn Novas for assistance with diagnosis and mgmt.  -BiPAP ordered if needed -Continue Singulair, Trelegy, steroids -With the wheezing, and seems to be asthma exacerbation -Troponin elevation likely demand ischemia -Possible PE on CTA, continue heparin, repeat CTA study as radiologist recommended -COVID negative -respiratory pathogen panel negative  -Continue to monitor in  stepdown ICU  Elevated troponin - Troponin elevation likely demand ischemia in setting of respiratory distress (acute) -Continue to cycle troponins -EKG shows sinus tachycardia with a rate of 107, QTc 466, there is baseline artifact but appears to be some ST depression in the inferior leads -Denying chest pain at this time -Cardiology consulted -This is likely demand ischemia in the setting of acute hypoxic respiratory failure with possible PE -Repeat EKG for any new episodes of chest pain -Continue to monitor -completing 48 hours of IV heparin infusion   Asthma, chronic, unspecified asthma severity, with acute exacerbation - Wheezing, cough, severe SOB has been difficult to get to improve.   -COVID negative.  Resp panel negative.  -Only new exposure in the home with recent visitors from out of the country -Patient is not a smoker -Chest x-ray shows no active disease -There is a leukocytosis, patient does have a cough, but has been afebrile with no sick contacts-check a procalcitonin which was <0.10 -Continue as needed albuterol, scheduled DuoNeb, steroids, BiPAP -Continue to monitor  Hyperlipidemia - Continue Crestor  Leukocytosis - White blood cell count coming down - ?leukemoid reaction on steroids -No active disease on chest x-ray -Afebrile, but cough is present -COVID negative -Procalcitonin <0.10   Single subsegmental pulmonary embolism without acute cor pulmonale (HCC) - Presumed PE with possible subsegmental PE described on CTA -radiologist recommended repeat in 24 hours for confirmation but unfortunately was not able to confirm if PE present or not.  Ultrasound bilateral LE negative for DVT.  -Ultrasound DVT negative  for clot in legs.  -Echo reassuring  -Continue heparin drip for at least 48 hours given elevated troponin -Continue to monitor  Essential hypertension - Patient is on an ARB and HCTZ at baseline which is being held due to soft BP and bump in  creatinine -Systolic blood pressure maintaining in the low 100s at this time, holding home medication -Continue to monitor  DVT prophylaxis: IV heparin  Code Status: Full  Family Communication:  Disposition: Status is: Inpatient Remains inpatient appropriate because: Intensity is deserving of inpatient status   Consultants:  PCCM Dr. Melvyn Novas  Procedures:   Antimicrobials:    Subjective: Pt still coughing severely with wheezing and SOB.  Objective: Vitals:   06/03/22 1218 06/03/22 1220 06/03/22 1300 06/03/22 1400  BP:    (!) 122/53  Pulse:   (!) 106 96  Resp:   (!) 24 19  Temp: 98.4 F (36.9 C)     TempSrc: Oral     SpO2:  100% 98% 98%  Weight:      Height:        Intake/Output Summary (Last 24 hours) at 06/03/2022 1558 Last data filed at 06/03/2022 1200 Gross per 24 hour  Intake 551.08 ml  Output 900 ml  Net -348.92 ml   Filed Weights   06/01/22 1921 06/01/22 2200 06/03/22 0500  Weight: 79.4 kg 79.4 kg 78.9 kg   Examination:  General exam: not distressed. Speaking short sentences.  Loud frequent coughing and wheezing.  Respiratory system: diffuse fine expiratory wheezing bilateral. Cardiovascular system: normal S1 & S2 heard. No JVD, murmurs, rubs, gallops or clicks. No pedal edema. Gastrointestinal system: Abdomen is nondistended, soft and nontender. No organomegaly or masses felt. Normal bowel sounds heard. Central nervous system: Alert and oriented. No focal neurological deficits. Extremities: Symmetric 5 x 5 power. Skin: No rashes, lesions or ulcers. Psychiatry: Judgement and insight appear normal. Mood & affect appropriate.   Data Reviewed: I have personally reviewed following labs and imaging studies  CBC: Recent Labs  Lab 06/01/22 1514 06/02/22 0334 06/03/22 0411  WBC 19.0* 14.5* 25.8*  NEUTROABS 14.0*  --   --   HGB 13.9 12.6 12.6  HCT 44.4 39.6 39.8  MCV 80.3 80.2 80.7  PLT 364 336 003    Basic Metabolic Panel: Recent Labs  Lab 06/01/22 1514  06/02/22 0334 06/03/22 0411  NA 138 136 135  K 3.7 3.1* 4.5  CL 101 104 105  CO2 '27 23 22  '$ GLUCOSE 167* 179* 163*  BUN 12 15 32*  CREATININE 0.81 0.90 1.13*  CALCIUM 9.5 9.3 9.5  MG  --  2.2 2.4    CBG: No results for input(s): "GLUCAP" in the last 168 hours.  Recent Results (from the past 240 hour(s))  SARS Coronavirus 2 by RT PCR (hospital order, performed in [facility] hospital lab) *cepheid single result test* Anterior Nasal Swab     Status: None   Collection Time: 06/01/22  8:01 PM   Specimen: Anterior Nasal Swab  Result Value Ref Range Status   SARS Coronavirus 2 by RT PCR NEGATIVE NEGATIVE Final    Comment: (NOTE) SARS-CoV-2 target nucleic acids are NOT DETECTED.  The SARS-CoV-2 RNA is generally detectable in upper and lower respiratory specimens during the acute phase of infection. The lowest concentration of SARS-CoV-2 viral copies this assay can detect is 250 copies / mL. A negative result does not preclude SARS-CoV-2 infection and should not be used as the sole basis for treatment or other patient  management decisions.  A negative result may occur with improper specimen collection / handling, submission of specimen other than nasopharyngeal swab, presence of viral mutation(s) within the areas targeted by this assay, and inadequate number of viral copies (<250 copies / mL). A negative result must be combined with clinical observations, patient history, and epidemiological information.  Fact Sheet for Patients:   https://www.patel.info/  Fact Sheet for Healthcare Providers: https://hall.com/  This test is not yet approved or  cleared by the [place] FDA and has been authorized for detection and/or diagnosis of SARS-CoV-2 by FDA under an Emergency Use Authorization (EUA).  This EUA will remain in effect (meaning this test can be used) for the duration of the COVID-19 declaration under Section 564(b)(1) of the Act,  21 U.S.C. section 360bbb-3(b)(1), unless the authorization is terminated or revoked sooner.  Performed at [facility], [address] [place], [place] [address]   MRSA Next Gen by PCR, Nasal     Status: None   Collection Time: 06/01/22  9:35 PM   Specimen: Nasal Mucosa; Nasal Swab  Result Value Ref Range Status   MRSA by PCR Next Gen NOT DETECTED NOT DETECTED Final    Comment: (NOTE) The GeneXpert MRSA Assay (FDA approved for NASAL specimens only), is one component of a comprehensive MRSA colonization surveillance program. It is not intended to diagnose MRSA infection nor to guide or monitor treatment for MRSA infections. Test performance is not FDA approved in patients less than 24 years old. Performed at [facility], [address] [place], [place] [address]   Respiratory (~20 pathogens) panel by PCR     Status: None   Collection Time: 06/02/22  8:33 AM   Specimen: Nasopharyngeal Swab; Respiratory  Result Value Ref Range Status   Adenovirus NOT DETECTED NOT DETECTED Final   Coronavirus 229E NOT DETECTED NOT DETECTED Final    Comment: (NOTE) The Coronavirus on the Respiratory Panel, DOES NOT test for the novel  Coronavirus (2019 nCoV)    Coronavirus HKU1 NOT DETECTED NOT DETECTED Final   Coronavirus NL63 NOT DETECTED NOT DETECTED Final   Coronavirus OC43 NOT DETECTED NOT DETECTED Final   Metapneumovirus NOT DETECTED NOT DETECTED Final   Rhinovirus / Enterovirus NOT DETECTED NOT DETECTED Final   Influenza A NOT DETECTED NOT DETECTED Final   Influenza B NOT DETECTED NOT DETECTED Final   Parainfluenza Virus 1 NOT DETECTED NOT DETECTED Final   Parainfluenza Virus 2 NOT DETECTED NOT DETECTED Final   Parainfluenza Virus 3 NOT DETECTED NOT DETECTED Final   Parainfluenza Virus 4 NOT DETECTED NOT DETECTED Final   Respiratory Syncytial Virus NOT DETECTED NOT DETECTED Final   Bordetella pertussis NOT DETECTED NOT DETECTED Final   Bordetella Parapertussis NOT DETECTED NOT DETECTED  Final   Chlamydophila pneumoniae NOT DETECTED NOT DETECTED Final   Mycoplasma pneumoniae NOT DETECTED NOT DETECTED Final    Comment: Performed at [facility], [place] [address], [place], [place] [address]     Radiology Studies: CT Angio Chest Pulmonary Embolism (PE) W or WO Contrast  Result Date: 06/02/2022 CLINICAL DATA:  Pulmonary embolism (PE) suspected, high prob. Repeat filling defect from CTA Chest on 06/01/2022, patient c/o shortness of breath EXAM: CT ANGIOGRAPHY CHEST WITH CONTRAST TECHNIQUE: Multidetector CT imaging of the chest was performed using the standard protocol during bolus administration of intravenous contrast. Multiplanar CT image reconstructions and MIPs were obtained to evaluate the vascular anatomy. RADIATION DOSE REDUCTION: This exam was performed according to the departmental dose-optimization  program which includes automated exposure control, adjustment of the mA and/or kV according to patient size and/or use of iterative reconstruction technique. CONTRAST:  125m OMNIPAQUE IOHEXOL 350 MG/ML SOLN COMPARISON:  CT angio chest 06/01/2022 FINDINGS: Cardiovascular: Preferential opacification of the thoracic aorta. Fair opacification of the pulmonary arteries to the proximal segmental level. No evidence of pulmonary embolism. Normal heart size. No significant pericardial effusion. No thoracic aorta dissection. The thoracic aorta is normal in caliber. Mild atherosclerotic plaque of the thoracic aorta. Three-vessel coronary artery calcifications. Mediastinum/Nodes: No enlarged mediastinal, hilar, or axillary lymph nodes. Thyroid gland, trachea, and esophagus demonstrate no significant findings. Lungs/Pleura: Diffuse bronchial wall thickening. No focal consolidation. No pulmonary nodule. No pulmonary mass. No pleural effusion. No pneumothorax. Upper Abdomen: No acute abnormality. Musculoskeletal: No chest wall abnormality. Right shoulder rotator cuff anchor suture. No suspicious lytic or  blastic osseous lesions. No acute displaced fracture. Multilevel degenerative changes of the spine. Review of the MIP images confirms the above findings. IMPRESSION: 1. No central or proximal segmental pulmonary embolus. Markedly limited evaluation more distally due to timing of contrast. Unable to evaluate previously question left lower lobe pulmonary embolus. 2. No acute aortic abnormality. 3. Diffuse bronchial wall thickening consistent with smoking-related small airway disease. 4. Aortic Atherosclerosis (ICD10-I70.0) including three-vessel coronary calcification. Electronically Signed   By: MIven FinnM.D.   On: 06/02/2022 18:33   [place]Venous Img Lower Bilateral (DVT)  Result Date: 06/02/2022 CLINICAL DATA:  Shortness of breath.  Evaluate for DVT. EXAM: BILATERAL LOWER EXTREMITY VENOUS DOPPLER ULTRASOUND TECHNIQUE: Gray-scale sonography with graded compression, as well as color Doppler and duplex ultrasound were performed to evaluate the lower extremity deep venous systems from the level of the common femoral vein and including the common femoral, femoral, profunda femoral, popliteal and calf veins including the posterior tibial, peroneal and gastrocnemius veins when visible. The superficial great saphenous vein was also interrogated. Spectral Doppler was utilized to evaluate flow at rest and with distal augmentation maneuvers in the common femoral, femoral and popliteal veins. COMPARISON:  Right lower extremity venous Doppler ultrasound-06/29/2006 (negative). FINDINGS: RIGHT LOWER EXTREMITY Common Femoral Vein: No evidence of thrombus. Normal compressibility, respiratory phasicity and response to augmentation. Saphenofemoral Junction: No evidence of thrombus. Normal compressibility and flow on color Doppler imaging. Profunda Femoral Vein: No evidence of thrombus. Normal compressibility and flow on color Doppler imaging. Femoral Vein: No evidence of thrombus. Normal compressibility, respiratory phasicity  and response to augmentation. Popliteal Vein: No evidence of thrombus. Normal compressibility, respiratory phasicity and response to augmentation. Calf Veins: No evidence of thrombus. Normal compressibility and flow on color Doppler imaging. Superficial Great Saphenous Vein: No evidence of thrombus. Normal compressibility. Other Findings:  None. LEFT LOWER EXTREMITY Common Femoral Vein: No evidence of thrombus. Normal compressibility, respiratory phasicity and response to augmentation. Saphenofemoral Junction: No evidence of thrombus. Normal compressibility and flow on color Doppler imaging. Profunda Femoral Vein: No evidence of thrombus. Normal compressibility and flow on color Doppler imaging. Femoral Vein: No evidence of thrombus. Normal compressibility, respiratory phasicity and response to augmentation. Popliteal Vein: No evidence of thrombus. Normal compressibility, respiratory phasicity and response to augmentation. Calf Veins: No evidence of thrombus. Normal compressibility and flow on color Doppler imaging. Superficial Great Saphenous Vein: No evidence of thrombus. Normal compressibility. Other Findings:  None. IMPRESSION: No evidence of DVT within either lower extremity. Electronically Signed   By: JSandi MariscalM.D.   On: 06/02/2022 11:55   ECHOCARDIOGRAM COMPLETE  Result Date: 06/02/2022  ECHOCARDIOGRAM REPORT   Patient Name:   Tammy [name] Date of Exam: 06/02/2022 Medical Rec #:  [id number]      Height:       65.0 in Accession #:    [id number]     Weight:       175.0 lb Date of Birth:  Nov 12, 1946      BSA:          1.869 m Patient Age:    77 years       BP:           133/72 mmHg Patient Gender: F              HR:           102 bpm. Exam Location:  [facility] Procedure: 2D Echo, Cardiac Doppler and Color Doppler Indications:    Pulmonary embolus  History:        Patient has no prior history of Echocardiogram examinations.                 Risk Factors:Hypertension and Dyslipidemia.  Sonographer:     Wenda Low Referring Phys: [id number] ASIA B Beckett  1. Left ventricular ejection fraction, by estimation, is 70 to 75%. The left ventricle has hyperdynamic function. The left ventricle has no regional wall motion abnormalities. There is moderate hypertrophy of the basal septal segment. The rest of the LV  segments demonstrate mild left ventricular hypertrophy. Left ventricular diastolic parameters are consistent with Grade I diastolic dysfunction (impaired relaxation).  2. Right ventricular systolic function is normal. The right ventricular size is normal. There is mildly elevated pulmonary artery systolic pressure. The estimated right ventricular systolic pressure is 01.6 mmHg.  3. The mitral valve is grossly normal. Trivial mitral valve regurgitation.  4. The aortic valve is tricuspid. There is mild calcification of the aortic valve. There is mild thickening of the aortic valve. Aortic valve regurgitation is not visualized. Aortic valve sclerosis/calcification is present, without any evidence of aortic stenosis.  5. The inferior vena cava is dilated in size with >50% respiratory variability, suggesting right atrial pressure of 8 mmHg. Comparison(s): No prior Echocardiogram. FINDINGS  Left Ventricle: Left ventricular ejection fraction, by estimation, is 70 to 75%. The left ventricle has hyperdynamic function. The left ventricle has no regional wall motion abnormalities. The left ventricular internal cavity size was normal in size. There is moderate hypertrophy of the basal septal segment. The rest of the LV segments demonstrate mild left ventricular hypertrophy. Left ventricular diastolic parameters are consistent with Grade I diastolic dysfunction (impaired relaxation). Right Ventricle: The right ventricular size is normal. No increase in right ventricular wall thickness. Right ventricular systolic function is normal. There is mildly elevated pulmonary artery systolic pressure. The tricuspid  regurgitant velocity is 2.84  m/s, and with an assumed right atrial pressure of 8 mmHg, the estimated right ventricular systolic pressure is 01.0 mmHg. Left Atrium: Left atrial size was normal in size. Right Atrium: Right atrial size was normal in size. Pericardium: There is no evidence of pericardial effusion. Mitral Valve: The mitral valve is grossly normal. There is mild thickening of the mitral valve leaflet(s). Trivial mitral valve regurgitation. MV peak gradient, 4.7 mmHg. The mean mitral valve gradient is 2.0 mmHg. Tricuspid Valve: The tricuspid valve is normal in structure. Tricuspid valve regurgitation is trivial. Aortic Valve: The aortic valve is tricuspid. There is mild calcification of the aortic valve. There is mild thickening of the aortic valve. Aortic valve regurgitation is  not visualized. Aortic valve sclerosis/calcification is present, without any evidence of aortic stenosis. Aortic valve mean gradient measures 7.0 mmHg. Aortic valve peak gradient measures 14.8 mmHg. Aortic valve area, by VTI measures 2.17 cm. Pulmonic Valve: The pulmonic valve was normal in structure. Pulmonic valve regurgitation is trivial. Aorta: The aortic root is normal in size and structure. Venous: The inferior vena cava is dilated in size with greater than 50% respiratory variability, suggesting right atrial pressure of 8 mmHg. IAS/Shunts: The atrial septum is grossly normal.  LEFT VENTRICLE PLAX 2D LVIDd:         4.30 cm   Diastology LVIDs:         2.40 cm   LV e' medial:    6.85 cm/s LV PW:         1.30 cm   LV E/e' medial:  15.0 LV IVS:        1.20 cm   LV e' lateral:   10.00 cm/s LVOT diam:     2.00 cm   LV E/e' lateral: 10.3 LV SV:         75 LV SV Index:   40 LVOT Area:     3.14 cm  RIGHT VENTRICLE RV Basal diam:  3.20 cm RV Mid diam:    2.60 cm RV S prime:     17.60 cm/s TAPSE (M-mode): 2.5 cm LEFT ATRIUM             Index        RIGHT ATRIUM           Index LA diam:        3.60 cm 1.93 cm/m   RA Area:     15.40  cm LA Vol (A2C):   57.4 ml 30.71 ml/m  RA Volume:   36.00 ml  19.26 ml/m LA Vol (A4C):   53.3 ml 28.51 ml/m LA Biplane Vol: 58.3 ml 31.19 ml/m  AORTIC VALVE                     PULMONIC VALVE AV Area (Vmax):    2.20 cm      PV Vmax:       1.23 m/s AV Area (Vmean):   2.23 cm      PV Peak grad:  6.1 mmHg AV Area (VTI):     2.17 cm AV Vmax:           192.67 cm/s AV Vmean:          124.667 cm/s AV VTI:            0.348 m AV Peak Grad:      14.8 mmHg AV Mean Grad:      7.0 mmHg LVOT Vmax:         135.00 cm/s LVOT Vmean:        88.600 cm/s LVOT VTI:          0.240 m LVOT/AV VTI ratio: 0.69  AORTA Ao Root diam: 2.60 cm Ao Asc diam:  2.90 cm MITRAL VALVE                TRICUSPID VALVE MV Area (PHT): 4.29 cm     TR Peak grad:   32.3 mmHg MV Area VTI:   2.34 cm     TR Vmax:        284.00 cm/s MV Peak grad:  4.7 mmHg MV Mean grad:  2.0 mmHg     SHUNTS MV Vmax:  1.08 m/s     Systemic VTI:  0.24 m MV Vmean:      71.0 cm/s    Systemic Diam: 2.00 cm MV Decel Time: 177 msec MV E velocity: 103.00 cm/s MV A velocity: 108.00 cm/s MV E/A ratio:  0.95 Gwyndolyn Kaufman MD Electronically signed by Gwyndolyn Kaufman MD Signature Date/Time: 06/02/2022/10:48:34 AM    Final    CT Angio Chest PE W and/or Wo Contrast  Result Date: 06/01/2022 CLINICAL DATA:  Shortness of breath, concern for pulmonary embolism. EXAM: CT ANGIOGRAPHY CHEST WITH CONTRAST TECHNIQUE: Multidetector CT imaging of the chest was performed using the standard protocol during bolus administration of intravenous contrast. Multiplanar CT image reconstructions and MIPs were obtained to evaluate the vascular anatomy. RADIATION DOSE REDUCTION: This exam was performed according to the departmental dose-optimization program which includes automated exposure control, adjustment of the mA and/or kV according to patient size and/or use of iterative reconstruction technique. CONTRAST:  62m OMNIPAQUE IOHEXOL 350 MG/ML SOLN COMPARISON:  Chest radiograph performed the  same day. FINDINGS: Cardiovascular: Satisfactory opacification of the pulmonary arteries to the segmental level. There is a questionable filling defect in a subsegmental pulmonary artery supplying the left lower lobe (series 5, image [id number]). Vascular calcifications are seen in the aortic arch. Normal heart size. No pericardial effusion. Mediastinum/Nodes: No enlarged mediastinal, hilar, or axillary lymph nodes. Thyroid gland, trachea, and esophagus demonstrate no significant findings. Lungs/Pleura: Lungs are clear. No pleural effusion or pneumothorax. Upper Abdomen: No acute abnormality. Musculoskeletal: Degenerative changes are seen in the spine. Review of the MIP images confirms the above findings. IMPRESSION: 1. Questionable small filling defect in a subsegmental pulmonary artery supplying the left lower lobe may represent a pulmonary embolism. If it would help direct clinical management, a follow-up CT PE protocol could be performed in 24 hours to confirm the presence/absence of this finding. Aortic Atherosclerosis (ICD10-I70.0). Electronically Signed   By: TZerita BoersM.D.   On: 06/01/2022 18:44    Scheduled Meds:  aspirin EC  81 mg Oral Daily   benzonatate  200 mg Oral TID   Chlorhexidine Gluconate Cloth  6 each Topical Q0600   chlorpheniramine-HYDROcodone  5 mL Oral Q6H   dextromethorphan-guaiFENesin  2 tablet Oral BID   ipratropium-albuterol  3 mL Nebulization QID   loratadine  10 mg Oral Daily   melatonin  6 mg Oral QHS   [START ON 06/04/2022] methylPREDNISolone (SOLU-MEDROL) injection  80 mg Intravenous Q12H   pantoprazole  40 mg Oral BID AC   pneumococcal 20-valent conjugate vaccine  0.5 mL Intramuscular Tomorrow-1000   rosuvastatin  5 mg Oral Once per day on Sun Thu   Continuous Infusions:  heparin 850 Units/hr (06/03/22 1200)     LOS: 2 days   Critical Care Procedure Note Authorized and Performed by: CMurvin NatalMD  Total Critical Care time:  45 mins Due to a high probability  of clinically significant, life threatening deterioration, the patient required my highest level of preparedness to intervene emergently and I personally spent this critical care time directly and personally managing the patient.  This critical care time included obtaining a history; examining the patient, pulse oximetry; ordering and review of studies; arranging urgent treatment with development of a management plan; evaluation of patient's response of treatment; frequent reassessment; and discussions with other providers.  This critical care time was performed to assess and manage the high probability of imminent and life threatening deterioration that could result in multi-organ failure.  It was exclusive of  separately billable procedures and treating other patients and teaching time.    Irwin Brakeman, MD How to contact the [facility] Attending or Consulting provider [place] or covering provider during after hours [place], for this patient?  Check the care team in [facility] and look for a) attending/consulting TRH provider listed and b) the [facility] team listed Log into [email or web address] and use [place] universal password to access. If you do not have the password, please contact the hospital operator. Locate the [facility] provider you are looking for under [facility] and page to a number that you can be directly reached. If you still have difficulty reaching the provider, please page the [facility] (Director on Call) for the Hospitalists listed on amion for assistance.  06/03/2022, 3:58 PM

## 2022-06-03 NOTE — Telephone Encounter (Signed)
Called and got fax number for Dr. Velvet Bathe office [phone]. Will send over documentation for last OV note to be faxed to [facility] office

## 2022-06-03 NOTE — Telephone Encounter (Signed)
From Dr. Melvyn Novas:   call dr Deeann Saint office for last ov - need it asap and if needs signature fax form to ICU

## 2022-06-04 ENCOUNTER — Encounter (HOSPITAL_COMMUNITY): Payer: Self-pay | Admitting: Family Medicine

## 2022-06-04 ENCOUNTER — Inpatient Hospital Stay (HOSPITAL_COMMUNITY): Payer: Medicare Other

## 2022-06-04 DIAGNOSIS — J45901 Unspecified asthma with (acute) exacerbation: Secondary | ICD-10-CM | POA: Diagnosis not present

## 2022-06-04 DIAGNOSIS — I10 Essential (primary) hypertension: Secondary | ICD-10-CM | POA: Diagnosis not present

## 2022-06-04 DIAGNOSIS — J9601 Acute respiratory failure with hypoxia: Secondary | ICD-10-CM | POA: Diagnosis not present

## 2022-06-04 DIAGNOSIS — R778 Other specified abnormalities of plasma proteins: Secondary | ICD-10-CM | POA: Diagnosis not present

## 2022-06-04 LAB — BASIC METABOLIC PANEL
Anion gap: 7 (ref 5–15)
BUN: 34 mg/dL — ABNORMAL HIGH (ref 8–23)
CO2: 26 mmol/L (ref 22–32)
Calcium: 9 mg/dL (ref 8.9–10.3)
Chloride: 103 mmol/L (ref 98–111)
Creatinine, Ser: 1.03 mg/dL — ABNORMAL HIGH (ref 0.44–1.00)
GFR, Estimated: 56 mL/min — ABNORMAL LOW (ref >60)
Glucose, Bld: 142 mg/dL — ABNORMAL HIGH (ref 70–99)
Potassium: 4.4 mmol/L (ref 3.5–5.1)
Sodium: 136 mmol/L (ref 135–145)

## 2022-06-04 LAB — CBC
HCT: 37.1 % (ref 36.0–46.0)
Hemoglobin: 11.9 g/dL — ABNORMAL LOW (ref 12.0–15.0)
MCH: 25.6 pg — ABNORMAL LOW (ref 26.0–34.0)
MCHC: 32.1 g/dL (ref 30.0–36.0)
MCV: 79.8 fL — ABNORMAL LOW (ref 80.0–100.0)
Platelets: 316 10*3/uL (ref 150–400)
RBC: 4.65 MIL/uL (ref 3.87–5.11)
RDW: 15 % (ref 11.5–15.5)
WBC: 19.5 10*3/uL — ABNORMAL HIGH (ref 4.0–10.5)
nRBC: 0 % (ref 0.0–0.2)

## 2022-06-04 LAB — HEPARIN LEVEL (UNFRACTIONATED): Heparin Unfractionated: >1.1 IU/mL — ABNORMAL HIGH (ref 0.30–0.70)

## 2022-06-04 MED ORDER — TECHNETIUM TO 99M ALBUMIN AGGREGATED
4.4000 | Freq: Once | INTRAVENOUS | Status: AC | PRN
  Administered 2022-06-04: 4.4 via INTRAVENOUS

## 2022-06-04 MED ORDER — HEPARIN (PORCINE) 25000 UT/250ML-% IV SOLN: 600.0000 [IU]/h | INTRAVENOUS | Status: DC

## 2022-06-04 MED ORDER — METHYLPREDNISOLONE SODIUM SUCC 125 MG IJ SOLR
60.0000 mg | Freq: Two times a day (BID) | INTRAMUSCULAR | Status: DC
  Administered 2022-06-04 – 2022-06-08 (×9): 60 mg via INTRAVENOUS
  Filled 2022-06-04 (×9): qty 2

## 2022-06-04 MED ORDER — POLYETHYLENE GLYCOL 3350 17 G PO PACK
17.0000 g | PACK | Freq: Every day | ORAL | Status: DC
  Administered 2022-06-04 – 2022-06-08 (×5): 17 g via ORAL
  Filled 2022-06-04 (×5): qty 1

## 2022-06-04 MED ORDER — HEPARIN SODIUM (PORCINE) 5000 UNIT/ML IJ SOLN
5000.0000 [IU] | Freq: Three times a day (TID) | INTRAMUSCULAR | Status: DC
  Administered 2022-06-05 – 2022-06-09 (×13): 5000 [IU] via SUBCUTANEOUS
  Filled 2022-06-04 (×13): qty 1

## 2022-06-04 MED ORDER — APIXABAN 5 MG PO TABS: 5.0000 mg | ORAL_TABLET | Freq: Two times a day (BID) | ORAL | Status: DC

## 2022-06-04 MED ORDER — APIXABAN 5 MG PO TABS
10.0000 mg | ORAL_TABLET | Freq: Two times a day (BID) | ORAL | Status: DC
  Administered 2022-06-04: 10 mg via ORAL
  Filled 2022-06-04: qty 2

## 2022-06-04 NOTE — Discharge Instructions (Signed)
Information on my medicine - ELIQUIS (apixaban)  This medication education was reviewed with me or my healthcare representative as part of my discharge preparation.    Why was Eliquis prescribed for you? Eliquis was prescribed to treat blood clots that may have been found in the veins of your legs (deep vein thrombosis) or in your lungs (pulmonary embolism) and to reduce the risk of them occurring again.  What do You need to know about Eliquis ? The starting dose is 10 mg (two 5 mg tablets) taken TWICE daily for the FIRST SEVEN (7) DAYS, then on 06/11/2022 the dose is reduced to ONE 5 mg tablet taken TWICE daily.  Eliquis may be taken with or without food.   Try to take the dose about the same time in the morning and in the evening. If you have difficulty swallowing the tablet whole please discuss with your pharmacist how to take the medication safely.  Take Eliquis exactly as prescribed and DO NOT stop taking Eliquis without talking to the doctor who prescribed the medication.  Stopping may increase your risk of developing a new blood clot.  Refill your prescription before you run out.  After discharge, you should have regular check-up appointments with your healthcare provider that is prescribing your Eliquis.    What do you do if you miss a dose? If a dose of ELIQUIS is not taken at the scheduled time, take it as soon as possible on the same day and twice-daily administration should be resumed. The dose should not be doubled to make up for a missed dose.  Important Safety Information A possible side effect of Eliquis is bleeding. You should call your healthcare provider right away if you experience any of the following: Bleeding from an injury or your nose that does not stop. Unusual colored urine (red or dark brown) or unusual colored stools (red or black). Unusual bruising for unknown reasons. A serious fall or if you hit your head (even if there is no bleeding).  Some  medicines may interact with Eliquis and might increase your risk of bleeding or clotting while on Eliquis. To help avoid this, consult your healthcare provider or pharmacist prior to using any new prescription or non-prescription medications, including herbals, vitamins, non-steroidal anti-inflammatory drugs (NSAIDs) and supplements.  This website has more information on Eliquis (apixaban): http://www.eliquis.com/eliquis/home

## 2022-06-04 NOTE — Progress Notes (Signed)
Pt transported down to radiology via wheelchair for nuclear medicine test to check for PE.

## 2022-06-04 NOTE — Progress Notes (Signed)
Pt arrived back from radiology

## 2022-06-04 NOTE — Progress Notes (Signed)
PROGRESS NOTE   Tammy [name]  UVO:[id number] DOB: [birth year] DOA: 06/01/2022 PCP: Celene Squibb, MD   Chief Complaint  Patient presents with   Shortness of Breath   Level of care: Stepdown  Brief Admission History:   76 y.o. female with medical history significant of hypertension and controlled asthma who presents to ED with a chief complaint of dyspnea.  Patient reportedly was quite short of breath that hesitation, only able to speak in 2-3 word phrases, and tripoding.  At the time of my exam patient is no longer tripoding, but still cannot complete a sentence without having to close her eyes and focus on breathing.  She has an audible wheeze from the doorway.  Due to her work of breathing, history is quite limited.  She reports that her dyspnea was gradual in onset and progressively worsening since last night.  She was not able to lay flat at all last night.  She has not noticed any swelling in her legs.  She has no history of CHF/fluid overload.  Her dyspnea was also worse on exertion.  It became acutely worse in the middle of the night last night.  She denies chest pain.  Her sister, at bedside, provides some extra history.  She reports that patient has recently had family members staying in her home from [place] places including [place], [place], [place].    Assessment and Plan: * Acute respiratory failure with hypoxia (HCC) -secondary to severe asthma exacerbation -Patient has no oxygen requirement at baseline -wean oxygen as able - consulted to [facility] pulmonologist Dr. Melvyn Novas for assistance with diagnosis and mgmt.  -BiPAP ordered if needed -Continue Singulair, steroids, bronchodilators -With the wheezing, and seems to be asthma exacerbation -Troponin elevation likely demand ischemia -PE has been ruled out.  V/Q scan with no evidence of PE.  2nd CTA chest no finding of PE.  -COVID negative -respiratory pathogen panel negative  -Continue to monitor in stepdown ICU  Elevated  troponin - Troponin elevation likely demand ischemia in setting of respiratory distress (acute) -Continue to cycle troponins -EKG shows sinus tachycardia with a rate of 107, QTc 466, there is baseline artifact but appears to be some ST depression in the inferior leads -Denying chest pain at this time -Cardiology consulted  -This is felt to be demand ischemia in the setting of acute hypoxic respiratory failure with possible PE -Repeat EKG for any new episodes of chest pain -Continue to monitor -completed 48 hours of IV heparin   Asthma, chronic, unspecified asthma severity, with acute exacerbation - Wheezing, cough, severe SOB has been slowly getting better.   -COVID negative.  Resp panel negative.  -Only new exposure in the home with recent visitors from out of the country -Patient is not a smoker -Chest x-ray shows no active disease -There is a leukocytosis, patient does have a cough, but has been afebrile with no sick contacts-check a procalcitonin which was <0.10 and reassuring -Continue steroids, nebs, mucolytics, singulair, cough suppressants -Continue to monitor in stepdown ICU  Hyperlipidemia - Continue Crestor  Leukocytosis - White blood cell count coming down -leukemoid reaction from high dose steroids -No active disease on chest x-ray -COVID negative -Procalcitonin <0.10   Pulmonary Embolus RULED OUT - Presumed PE with possible subsegmental PE described on CTA -radiologist recommended repeat in 24 hours for confirmation but repeat CT did not show PE.   Ultrasound bilateral LE negative for DVT.  -Ultrasound DVT negative for clot in legs. -V/Q scan done on 7/8 with  no evidence of PE  -Echo reassuring  -DC heparin/apixaban and full anticoagulation at this point as we have ruled out PE  Essential hypertension - Patient is on an ARB and HCTZ at baseline which is being held due to soft BP and bump in creatinine -Systolic blood pressure maintaining in the low 100s at this  time, holding home medication -Continue to monitor; so far BPs have been stable  DVT prophylaxis: IV heparin  Code Status: Full  Family Communication: son at bedside Disposition: Status is: Inpatient Remains inpatient appropriate because: Intensity is deserving of inpatient status   Consultants:  PCCM Dr. Melvyn Novas  Procedures:   Antimicrobials:    Subjective: Pt is breathing a little better today but still struggling to speak, can barely catch her breath and coughing almost every 30 seconds.   Objective: Vitals:   06/04/22 1124 06/04/22 1200 06/04/22 1300 06/04/22 1303  BP:  (!) 133/91 122/62   Pulse:  89 72   Resp:      Temp: (!) 97.3 F (36.3 C)     TempSrc: Oral     SpO2:  96% 94% 94%  Weight:      Height:        Intake/Output Summary (Last 24 hours) at 06/04/2022 1421 Last data filed at 06/04/2022 0940 Gross per 24 hour  Intake 851.08 ml  Output 1280 ml  Net -428.92 ml   Filed Weights   06/01/22 1921 06/01/22 2200 06/03/22 0500  Weight: 79.4 kg 79.4 kg 78.9 kg   Examination:  General exam: persistent respiratory distress, able to sit up in chair.  Loud frequent coughing and wheezing.  Respiratory system: diffuse fine inspiratory/expiratory wheezing bilateral. Cardiovascular system: normal S1 & S2 heard. No JVD, murmurs, rubs, gallops or clicks. No pedal edema. Gastrointestinal system: Abdomen is nondistended, soft and nontender. No organomegaly or masses felt. Normal bowel sounds heard. Central nervous system: Alert and oriented. No focal neurological deficits. Extremities: Symmetric 5 x 5 power. Skin: No rashes, lesions or ulcers. Psychiatry: Judgement and insight appear normal. Mood & affect appropriate.   Data Reviewed: I have personally reviewed following labs and imaging studies  CBC: Recent Labs  Lab 06/01/22 1514 06/02/22 0334 06/03/22 0411 06/04/22 0355  WBC 19.0* 14.5* 25.8* 19.5*  NEUTROABS 14.0*  --   --   --   HGB 13.9 12.6 12.6 11.9*  HCT  44.4 39.6 39.8 37.1  MCV 80.3 80.2 80.7 79.8*  PLT 364 336 322 007    Basic Metabolic Panel: Recent Labs  Lab 06/01/22 1514 06/02/22 0334 06/03/22 0411 06/04/22 0355  NA 138 136 135 136  K 3.7 3.1* 4.5 4.4  CL 101 104 105 103  CO2 '27 23 22 26  '$ GLUCOSE 167* 179* 163* 142*  BUN 12 15 32* 34*  CREATININE 0.81 0.90 1.13* 1.03*  CALCIUM 9.5 9.3 9.5 9.0  MG  --  2.2 2.4  --     CBG: No results for input(s): "GLUCAP" in the last 168 hours.  Recent Results (from the past 240 hour(s))  SARS Coronavirus 2 by RT PCR (hospital order, performed in [facility] hospital lab) *cepheid single result test* Anterior Nasal Swab     Status: None   Collection Time: 06/01/22  8:01 PM   Specimen: Anterior Nasal Swab  Result Value Ref Range Status   SARS Coronavirus 2 by RT PCR NEGATIVE NEGATIVE Final    Comment: (NOTE) SARS-CoV-2 target nucleic acids are NOT DETECTED.  The SARS-CoV-2 RNA is generally detectable  in upper and lower respiratory specimens during the acute phase of infection. The lowest concentration of SARS-CoV-2 viral copies this assay can detect is 250 copies / mL. A negative result does not preclude SARS-CoV-2 infection and should not be used as the sole basis for treatment or other patient management decisions.  A negative result may occur with improper specimen collection / handling, submission of specimen other than nasopharyngeal swab, presence of viral mutation(s) within the areas targeted by this assay, and inadequate number of viral copies (<250 copies / mL). A negative result must be combined with clinical observations, patient history, and epidemiological information.  Fact Sheet for Patients:   https://www.patel.info/  Fact Sheet for Healthcare Providers: https://hall.com/  This test is not yet approved or  cleared by the [place] FDA and has been authorized for detection and/or diagnosis of SARS-CoV-2 by FDA  under an Emergency Use Authorization (EUA).  This EUA will remain in effect (meaning this test can be used) for the duration of the COVID-19 declaration under Section 564(b)(1) of the Act, 21 U.S.C. section 360bbb-3(b)(1), unless the authorization is terminated or revoked sooner.  Performed at [facility], [address], [place], [place] [address]   MRSA Next Gen by PCR, Nasal     Status: None   Collection Time: 06/01/22  9:35 PM   Specimen: Nasal Mucosa; Nasal Swab  Result Value Ref Range Status   MRSA by PCR Next Gen NOT DETECTED NOT DETECTED Final    Comment: (NOTE) The GeneXpert MRSA Assay (FDA approved for NASAL specimens only), is one component of a comprehensive MRSA colonization surveillance program. It is not intended to diagnose MRSA infection nor to guide or monitor treatment for MRSA infections. Test performance is not FDA approved in patients less than 7 years old. Performed at [facility], [address], [place], [place] [address]   Respiratory (~20 pathogens) panel by PCR     Status: None   Collection Time: 06/02/22  8:33 AM   Specimen: Nasopharyngeal Swab; Respiratory  Result Value Ref Range Status   Adenovirus NOT DETECTED NOT DETECTED Final   Coronavirus 229E NOT DETECTED NOT DETECTED Final    Comment: (NOTE) The Coronavirus on the Respiratory Panel, DOES NOT test for the novel  Coronavirus (2019 nCoV)    Coronavirus HKU1 NOT DETECTED NOT DETECTED Final   Coronavirus NL63 NOT DETECTED NOT DETECTED Final   Coronavirus OC43 NOT DETECTED NOT DETECTED Final   Metapneumovirus NOT DETECTED NOT DETECTED Final   Rhinovirus / Enterovirus NOT DETECTED NOT DETECTED Final   Influenza A NOT DETECTED NOT DETECTED Final   Influenza B NOT DETECTED NOT DETECTED Final   Parainfluenza Virus 1 NOT DETECTED NOT DETECTED Final   Parainfluenza Virus 2 NOT DETECTED NOT DETECTED Final   Parainfluenza Virus 3 NOT DETECTED NOT DETECTED Final   Parainfluenza Virus 4 NOT DETECTED NOT  DETECTED Final   Respiratory Syncytial Virus NOT DETECTED NOT DETECTED Final   Bordetella pertussis NOT DETECTED NOT DETECTED Final   Bordetella Parapertussis NOT DETECTED NOT DETECTED Final   Chlamydophila pneumoniae NOT DETECTED NOT DETECTED Final   Mycoplasma pneumoniae NOT DETECTED NOT DETECTED Final    Comment: Performed at [facility], [place] [address] [place], [place] [address]     Radiology Studies: NM Pulmonary Perfusion  Result Date: 06/04/2022 CLINICAL DATA:  Chest pain and cough. EXAM: NUCLEAR MEDICINE PERFUSION LUNG SCAN TECHNIQUE: Perfusion images were obtained in multiple projections after intravenous injection of radiopharmaceutical. Ventilation scans intentionally  deferred if perfusion scan and chest x-ray adequate for interpretation during COVID 19 epidemic. RADIOPHARMACEUTICALS:  4.4 mCi Tc-80mMAA IV COMPARISON:  Chest CTA 06/02/2022 FINDINGS: Heterogeneous perfusion in the lung apices may reflect an underlying component of emphysema. No peripheral segmental perfusion defect in either lung. IMPRESSION: No scintigraphic evidence for pulmonary embolus. Electronically Signed   By: EMisty StanleyM.D.   On: 06/04/2022 12:45   CT Angio Chest Pulmonary Embolism (PE) W or WO Contrast  Result Date: 06/02/2022 CLINICAL DATA:  Pulmonary embolism (PE) suspected, high prob. Repeat filling defect from CTA Chest on 06/01/2022, patient c/o shortness of breath EXAM: CT ANGIOGRAPHY CHEST WITH CONTRAST TECHNIQUE: Multidetector CT imaging of the chest was performed using the standard protocol during bolus administration of intravenous contrast. Multiplanar CT image reconstructions and MIPs were obtained to evaluate the vascular anatomy. RADIATION DOSE REDUCTION: This exam was performed according to the departmental dose-optimization program which includes automated exposure control, adjustment of the mA and/or kV according to patient size and/or use of iterative reconstruction technique. CONTRAST:   1090mOMNIPAQUE IOHEXOL 350 MG/ML SOLN COMPARISON:  CT angio chest 06/01/2022 FINDINGS: Cardiovascular: Preferential opacification of the thoracic aorta. Fair opacification of the pulmonary arteries to the proximal segmental level. No evidence of pulmonary embolism. Normal heart size. No significant pericardial effusion. No thoracic aorta dissection. The thoracic aorta is normal in caliber. Mild atherosclerotic plaque of the thoracic aorta. Three-vessel coronary artery calcifications. Mediastinum/Nodes: No enlarged mediastinal, hilar, or axillary lymph nodes. Thyroid gland, trachea, and esophagus demonstrate no significant findings. Lungs/Pleura: Diffuse bronchial wall thickening. No focal consolidation. No pulmonary nodule. No pulmonary mass. No pleural effusion. No pneumothorax. Upper Abdomen: No acute abnormality. Musculoskeletal: No chest wall abnormality. Right shoulder rotator cuff anchor suture. No suspicious lytic or blastic osseous lesions. No acute displaced fracture. Multilevel degenerative changes of the spine. Review of the MIP images confirms the above findings. IMPRESSION: 1. No central or proximal segmental pulmonary embolus. Markedly limited evaluation more distally due to timing of contrast. Unable to evaluate previously question left lower lobe pulmonary embolus. 2. No acute aortic abnormality. 3. Diffuse bronchial wall thickening consistent with smoking-related small airway disease. 4. Aortic Atherosclerosis (ICD10-I70.0) including three-vessel coronary calcification. Electronically Signed   By: MoIven Finn.D.   On: 06/02/2022 18:33    Scheduled Meds:  aspirin EC  81 mg Oral Daily   benzonatate  200 mg Oral TID   Chlorhexidine Gluconate Cloth  6 each Topical Q0600   chlorpheniramine-HYDROcodone  5 mL Oral Q6H   dextromethorphan-guaiFENesin  2 tablet Oral BID   [START ON 06/05/2022] heparin injection (subcutaneous)  5,000 Units Subcutaneous Q8H   ipratropium-albuterol  3 mL  Nebulization QID   loratadine  10 mg Oral Daily   melatonin  6 mg Oral QHS   methylPREDNISolone (SOLU-MEDROL) injection  60 mg Intravenous Q12H   montelukast  10 mg Oral QHS   pantoprazole  40 mg Oral BID AC   pneumococcal 20-valent conjugate vaccine  0.5 mL Intramuscular Tomorrow-1000   polyethylene glycol  17 g Oral Daily   rosuvastatin  5 mg Oral Once per day on Sun Thu   Continuous Infusions:     LOS: 3 days   Critical Care Procedure Note Authorized and Performed by: C.Murvin NatalD  Total Critical Care time:  40 mins Due to a high probability of clinically significant, life threatening deterioration, the patient required my highest level of preparedness to intervene emergently and I personally spent this critical care time  directly and personally managing the patient.  This critical care time included obtaining a history; examining the patient, pulse oximetry; ordering and review of studies; arranging urgent treatment with development of a management plan; evaluation of patient's response of treatment; frequent reassessment; and discussions with other providers.  This critical care time was performed to assess and manage the high probability of imminent and life threatening deterioration that could result in multi-organ failure.  It was exclusive of separately billable procedures and treating other patients and teaching time.    Irwin Brakeman, MD How to contact the [facility] Attending or Consulting provider [place] or covering provider during after hours [place], for this patient?  Check the care team in [facility] and look for a) attending/consulting TRH provider listed and b) the [facility] team listed Log into [email or web address] and use [place]'s universal password to access. If you do not have the password, please contact the hospital operator. Locate the [facility] provider you are looking for under [facility] and page to a number that you can be directly reached. If you still have difficulty  reaching the provider, please page the [facility] (Director on Call) for the Hospitalists listed on amion for assistance.  06/04/2022, 2:21 PM

## 2022-06-04 NOTE — Progress Notes (Signed)
ANTICOAGULATION CONSULT NOTE - Follow Up Consult  Pharmacy Consult for apixaban Indication: pulmonary embolus  Labs: Recent Labs    06/02/22 0334 06/02/22 0839 06/02/22 1345 06/02/22 2225 06/03/22 0411 06/04/22 0355  HGB 12.6  --   --   --  12.6 11.9*  HCT 39.6  --   --   --  39.8 37.1  PLT 336  --   --   --  322 316  HEPARINUNFRC 1.04*  --    < > 0.49 0.67 >1.10*  CREATININE 0.90  --   --   --  1.13* 1.03*  TROPONINIHS 2,083* 2,438*  --   --  1,034*  --    < > = values in this interval not displayed.     Assessment: 76yo female supratherapeutic on heparin after two levels at goal though had been trending up quickly (0.49 to 0.67 to >1.1); no infusion issues or signs of bleeding per RN though noted that Hgb is starting to trend down.     Plan:  Stop heparin infusion Start apixaban 10 mg twice daily x 7 days followed by apixaban 5 mg twice daily Monitor H&H and s/s of bleeding

## 2022-06-04 NOTE — Progress Notes (Signed)
ANTICOAGULATION CONSULT NOTE - Follow Up Consult  Pharmacy Consult for heparin Indication: pulmonary embolus  Labs: Recent Labs    06/02/22 0334 06/02/22 0839 06/02/22 1345 06/02/22 2225 06/03/22 0411 06/04/22 0355  HGB 12.6  --   --   --  12.6 11.9*  HCT 39.6  --   --   --  39.8 37.1  PLT 336  --   --   --  322 316  HEPARINUNFRC 1.04*  --    < > 0.49 0.67 >1.10*  CREATININE 0.90  --   --   --  1.13* 1.03*  TROPONINIHS 2,083* 2,438*  --   --  1,034*  --    < > = values in this interval not displayed.     Assessment: 76yo female supratherapeutic on heparin after two levels at goal though had been trending up quickly (0.49 to 0.67 to >1.1); no infusion issues or signs of bleeding per RN though noted that Hgb is starting to trend down.  Goal of Therapy:  Heparin level 0.3-0.7 units/ml   Plan:  Will hold heparin infusion x1h then decrease heparin infusion by 4 units/kg/hr to 600 units/hr and check level in 8 hours.    Wynona Neat, PharmD, BCPS 06/04/2022,4:58 AM

## 2022-06-05 DIAGNOSIS — I10 Essential (primary) hypertension: Secondary | ICD-10-CM | POA: Diagnosis not present

## 2022-06-05 DIAGNOSIS — J45901 Unspecified asthma with (acute) exacerbation: Secondary | ICD-10-CM | POA: Diagnosis not present

## 2022-06-05 DIAGNOSIS — J9601 Acute respiratory failure with hypoxia: Secondary | ICD-10-CM | POA: Diagnosis not present

## 2022-06-05 DIAGNOSIS — R778 Other specified abnormalities of plasma proteins: Secondary | ICD-10-CM | POA: Diagnosis not present

## 2022-06-05 NOTE — Progress Notes (Signed)
PROGRESS NOTE   Tammy [name]  KGY:[id number] DOB: 1946/07/29 DOA: 06/01/2022 PCP: Celene Squibb, MD   Chief Complaint  Patient presents with   Shortness of Breath   Level of care: Stepdown  Brief Admission History:   76 y.o. female with medical history significant of hypertension and controlled asthma who presents to ED with a chief complaint of dyspnea.  Patient reportedly was quite short of breath that hesitation, only able to speak in 2-3 word phrases, and tripoding.  At the time of my exam patient is no longer tripoding, but still cannot complete a sentence without having to close her eyes and focus on breathing.  She has an audible wheeze from the doorway.  Due to her work of breathing, history is quite limited.  She reports that her dyspnea was gradual in onset and progressively worsening since last night.  She was not able to lay flat at all last night.  She has not noticed any swelling in her legs.  She has no history of CHF/fluid overload.  Her dyspnea was also worse on exertion.  It became acutely worse in the middle of the night last night.  She denies chest pain.  Her sister, at bedside, provides some extra history.  She reports that patient has recently had family members staying in her home from [place] places including [place], [place], [place].    Assessment and Plan: * Acute respiratory failure with hypoxia (HCC) -secondary to severe asthma exacerbation -Patient has no oxygen requirement at baseline -wean oxygen as able - consulted to [facility] pulmonologist Dr. Melvyn Novas for assistance with diagnosis and mgmt.  -BiPAP ordered if needed -Continue Singulair, steroids, bronchodilators -With the wheezing, and seems to be asthma exacerbation -Troponin elevation likely demand ischemia -PE has been ruled out.  V/Q scan with no evidence of PE.  2nd CTA chest no finding of PE.  -COVID negative -respiratory pathogen panel negative  -Continue to monitor in stepdown ICU  Elevated  troponin - Troponin elevation likely demand ischemia in setting of respiratory distress (acute) -Continue to cycle troponins -EKG shows sinus tachycardia with a rate of 107, QTc 466, there is baseline artifact but appears to be some ST depression in the inferior leads -Denying chest pain at this time -Cardiology consulted and would like to get coronary CT when more clinically stable from respiratory standpoint.  -This is felt to be demand ischemia in the setting of acute hypoxic respiratory failure with possible PE -Repeat EKG for any new episodes of chest pain -Continue to monitor -completed 48 hours of IV heparin   Asthma, chronic, unspecified asthma severity, with acute exacerbation - Wheezing, cough, severe SOB has been slowly getting better but not near baseline.   -COVID negative.  Resp panel negative.  -Only new exposure in the home with recent visitors from out of the country -Patient is not a smoker -Chest x-ray shows no active disease -There is a leukocytosis, patient does have a mostly nonproductive cough, but has been afebrile with no sick contacts-check a procalcitonin which was <0.10 and reassuring -Continue steroids, nebs, mucolytics, singulair, cough suppressants -Continue to monitor in stepdown ICU  Hyperlipidemia - Continue Crestor  Leukocytosis - White blood cell count remains elevated but is coming down -leukemoid reaction from high dose steroids -No active disease on chest x-ray -COVID negative -Procalcitonin <0.10   Pulmonary Embolus RULED OUT - Presumed PE with possible subsegmental PE described on initial CTA chest -radiologist recommended repeat study in 24 hours for confirmation but repeat  CT did not show PE due to timing of contrast administration.   Ultrasound bilateral LE negative for DVT.  -Ultrasound DVT negative for clot in legs. -V/Q scan done on 7/8 with no evidence of PE also reassuring that there is no PE -Echo reassuring : no heart strain  noted -DC heparin/apixaban and full anticoagulation at this point as we have ruled out PE  Essential hypertension - Patient is on an ARB and HCTZ at baseline which is being held due to soft BP and bump in creatinine -Systolic blood pressure maintaining in the low 100s at this time, holding home medication -Continue to monitor; so far BPs have been stable  DVT prophylaxis: IV heparin  Code Status: Full  Family Communication: son at bedside Disposition: Status is: Inpatient Remains inpatient appropriate because: Intensity is deserving of inpatient status   Consultants:  PCCM Dr. Melvyn Novas  Procedures:   Antimicrobials:    Subjective: Pt is breathing a little better today but still struggling to speak, can barely catch her breath and coughing almost every 30 seconds.   Objective: Vitals:   06/05/22 0811 06/05/22 0920 06/05/22 1000 06/05/22 1119  BP:  (!) 156/68 (!) 154/64   Pulse:  93 93   Resp:  17 13   Temp:    98.2 F (36.8 C)  TempSrc:    Oral  SpO2: 97% 91% 93%   Weight:      Height:        Intake/Output Summary (Last 24 hours) at 06/05/2022 1136 Last data filed at 06/05/2022 0851 Gross per 24 hour  Intake 340 ml  Output 1300 ml  Net -960 ml   Filed Weights   06/01/22 2200 06/03/22 0500 06/05/22 0500  Weight: 79.4 kg 78.9 kg 81.8 kg   Examination:  General exam: persistent respiratory distress, able to sit up in chair.  Loud frequent coughing and wheezing.  Respiratory system: diffuse fine inspiratory/expiratory wheezing bilateral. Cardiovascular system: normal S1 & S2 heard. No JVD, murmurs, rubs, gallops or clicks. No pedal edema. Gastrointestinal system: Abdomen is nondistended, soft and nontender. No organomegaly or masses felt. Normal bowel sounds heard. Central nervous system: Alert and oriented. No focal neurological deficits. Extremities: Symmetric 5 x 5 power. Skin: No rashes, lesions or ulcers. Psychiatry: Judgement and insight appear normal. Mood & affect  appropriate.   Data Reviewed: I have personally reviewed following labs and imaging studies  CBC: Recent Labs  Lab 06/01/22 1514 06/02/22 0334 06/03/22 0411 06/04/22 0355  WBC 19.0* 14.5* 25.8* 19.5*  NEUTROABS 14.0*  --   --   --   HGB 13.9 12.6 12.6 11.9*  HCT 44.4 39.6 39.8 37.1  MCV 80.3 80.2 80.7 79.8*  PLT 364 336 322 099    Basic Metabolic Panel: Recent Labs  Lab 06/01/22 1514 06/02/22 0334 06/03/22 0411 06/04/22 0355  NA 138 136 135 136  K 3.7 3.1* 4.5 4.4  CL 101 104 105 103  CO2 '27 23 22 26  '$ GLUCOSE 167* 179* 163* 142*  BUN 12 15 32* 34*  CREATININE 0.81 0.90 1.13* 1.03*  CALCIUM 9.5 9.3 9.5 9.0  MG  --  2.2 2.4  --     CBG: No results for input(s): "GLUCAP" in the last 168 hours.  Recent Results (from the past 240 hour(s))  SARS Coronavirus 2 by RT PCR (hospital order, performed in [facility] hospital lab) *cepheid single result test* Anterior Nasal Swab     Status: None   Collection Time: 06/01/22  8:01  PM   Specimen: Anterior Nasal Swab  Result Value Ref Range Status   SARS Coronavirus 2 by RT PCR NEGATIVE NEGATIVE Final    Comment: (NOTE) SARS-CoV-2 target nucleic acids are NOT DETECTED.  The SARS-CoV-2 RNA is generally detectable in upper and lower respiratory specimens during the acute phase of infection. The lowest concentration of SARS-CoV-2 viral copies this assay can detect is 250 copies / mL. A negative result does not preclude SARS-CoV-2 infection and should not be used as the sole basis for treatment or other patient management decisions.  A negative result may occur with improper specimen collection / handling, submission of specimen other than nasopharyngeal swab, presence of viral mutation(s) within the areas targeted by this assay, and inadequate number of viral copies (<250 copies / mL). A negative result must be combined with clinical observations, patient history, and epidemiological information.  Fact Sheet for Patients:    https://www.patel.info/  Fact Sheet for Healthcare Providers: https://hall.com/  This test is not yet approved or  cleared by the [place] FDA and has been authorized for detection and/or diagnosis of SARS-CoV-2 by FDA under an Emergency Use Authorization (EUA).  This EUA will remain in effect (meaning this test can be used) for the duration of the COVID-19 declaration under Section 564(b)(1) of the Act, 21 U.S.C. section 360bbb-3(b)(1), unless the authorization is terminated or revoked sooner.  Performed at [facility], [address] [place], [place] [address]   MRSA Next Gen by PCR, Nasal     Status: None   Collection Time: 06/01/22  9:35 PM   Specimen: Nasal Mucosa; Nasal Swab  Result Value Ref Range Status   MRSA by PCR Next Gen NOT DETECTED NOT DETECTED Final    Comment: (NOTE) The GeneXpert MRSA Assay (FDA approved for NASAL specimens only), is one component of a comprehensive MRSA colonization surveillance program. It is not intended to diagnose MRSA infection nor to guide or monitor treatment for MRSA infections. Test performance is not FDA approved in patients less than 40 years old. Performed at [facility], [address] [place], [place] [address]   Respiratory (~20 pathogens) panel by PCR     Status: None   Collection Time: 06/02/22  8:33 AM   Specimen: Nasopharyngeal Swab; Respiratory  Result Value Ref Range Status   Adenovirus NOT DETECTED NOT DETECTED Final   Coronavirus 229E NOT DETECTED NOT DETECTED Final    Comment: (NOTE) The Coronavirus on the Respiratory Panel, DOES NOT test for the novel  Coronavirus (2019 nCoV)    Coronavirus HKU1 NOT DETECTED NOT DETECTED Final   Coronavirus NL63 NOT DETECTED NOT DETECTED Final   Coronavirus OC43 NOT DETECTED NOT DETECTED Final   Metapneumovirus NOT DETECTED NOT DETECTED Final   Rhinovirus / Enterovirus NOT DETECTED NOT DETECTED Final   Influenza A NOT DETECTED NOT  DETECTED Final   Influenza B NOT DETECTED NOT DETECTED Final   Parainfluenza Virus 1 NOT DETECTED NOT DETECTED Final   Parainfluenza Virus 2 NOT DETECTED NOT DETECTED Final   Parainfluenza Virus 3 NOT DETECTED NOT DETECTED Final   Parainfluenza Virus 4 NOT DETECTED NOT DETECTED Final   Respiratory Syncytial Virus NOT DETECTED NOT DETECTED Final   Bordetella pertussis NOT DETECTED NOT DETECTED Final   Bordetella Parapertussis NOT DETECTED NOT DETECTED Final   Chlamydophila pneumoniae NOT DETECTED NOT DETECTED Final   Mycoplasma pneumoniae NOT DETECTED NOT DETECTED Final    Comment: Performed at [facility], [place] [address], [place], [place]  [address]     Radiology Studies: NM Pulmonary Perfusion  Result Date: 06/04/2022 CLINICAL DATA:  Chest pain and cough. EXAM: NUCLEAR MEDICINE PERFUSION LUNG SCAN TECHNIQUE: Perfusion images were obtained in multiple projections after intravenous injection of radiopharmaceutical. Ventilation scans intentionally deferred if perfusion scan and chest x-ray adequate for interpretation during COVID 19 epidemic. RADIOPHARMACEUTICALS:  4.4 mCi Tc-16mMAA IV COMPARISON:  Chest CTA 06/02/2022 FINDINGS: Heterogeneous perfusion in the lung apices may reflect an underlying component of emphysema. No peripheral segmental perfusion defect in either lung. IMPRESSION: No scintigraphic evidence for pulmonary embolus. Electronically Signed   By: EMisty StanleyM.D.   On: 06/04/2022 12:45    Scheduled Meds:  aspirin EC  81 mg Oral Daily   benzonatate  200 mg Oral TID   Chlorhexidine Gluconate Cloth  6 each Topical Q0600   chlorpheniramine-HYDROcodone  5 mL Oral Q6H   dextromethorphan-guaiFENesin  2 tablet Oral BID   heparin injection (subcutaneous)  5,000 Units Subcutaneous Q8H   ipratropium-albuterol  3 mL Nebulization QID   loratadine  10 mg Oral Daily   melatonin  6 mg Oral QHS   methylPREDNISolone (SOLU-MEDROL) injection  60 mg Intravenous Q12H   montelukast  10  mg Oral QHS   pantoprazole  40 mg Oral BID AC   pneumococcal 20-valent conjugate vaccine  0.5 mL Intramuscular Tomorrow-1000   polyethylene glycol  17 g Oral Daily   rosuvastatin  5 mg Oral Once per day on Sun Thu   Continuous Infusions:   LOS: 4 days   Critical Care Procedure Note Authorized and Performed by: CMurvin NatalMD  Total Critical Care time:  40 mins Due to a high probability of clinically significant, life threatening deterioration, the patient required my highest level of preparedness to intervene emergently and I personally spent this critical care time directly and personally managing the patient.  This critical care time included obtaining a history; examining the patient, pulse oximetry; ordering and review of studies; arranging urgent treatment with development of a management plan; evaluation of patient's response of treatment; frequent reassessment; and discussions with other providers.  This critical care time was performed to assess and manage the high probability of imminent and life threatening deterioration that could result in multi-organ failure.  It was exclusive of separately billable procedures and treating other patients and teaching time.    CIrwin Brakeman MD How to contact the T[facility] CenterAttending or Consulting provider [place] covering provider during after hours [place] for this patient?  Check the care team in [facility] look for a) attending/consulting [facility] provider listed and b) the TUnity[facility]iterteam listed Log into [email or web address] and use [facility] universal password to access. If you do not have the password, please contact the hospital operator. Locate the TK[facility] Medical Centerprovider you are looking for under [facility] and page to a number that you can be directly reached. If you still have difficulty reaching the provider, please page the [facility](Director on Call) for the Hospitalists listed on amion for assistance.  06/05/2022, 11:36 AM

## 2022-06-06 DIAGNOSIS — J4551 Severe persistent asthma with (acute) exacerbation: Secondary | ICD-10-CM | POA: Diagnosis not present

## 2022-06-06 DIAGNOSIS — J9601 Acute respiratory failure with hypoxia: Secondary | ICD-10-CM | POA: Diagnosis not present

## 2022-06-06 DIAGNOSIS — I10 Essential (primary) hypertension: Secondary | ICD-10-CM | POA: Diagnosis not present

## 2022-06-06 DIAGNOSIS — J45901 Unspecified asthma with (acute) exacerbation: Secondary | ICD-10-CM | POA: Diagnosis not present

## 2022-06-06 DIAGNOSIS — E782 Mixed hyperlipidemia: Secondary | ICD-10-CM | POA: Diagnosis not present

## 2022-06-06 LAB — CBC
HCT: 39.4 % (ref 36.0–46.0)
Hemoglobin: 12.7 g/dL (ref 12.0–15.0)
MCH: 25.7 pg — ABNORMAL LOW (ref 26.0–34.0)
MCHC: 32.2 g/dL (ref 30.0–36.0)
MCV: 79.8 fL — ABNORMAL LOW (ref 80.0–100.0)
Platelets: 307 10*3/uL (ref 150–400)
RBC: 4.94 MIL/uL (ref 3.87–5.11)
RDW: 14.7 % (ref 11.5–15.5)
WBC: 18.1 10*3/uL — ABNORMAL HIGH (ref 4.0–10.5)
nRBC: 0 % (ref 0.0–0.2)

## 2022-06-06 LAB — BASIC METABOLIC PANEL
Anion gap: 4 — ABNORMAL LOW (ref 5–15)
BUN: 26 mg/dL — ABNORMAL HIGH (ref 8–23)
CO2: 26 mmol/L (ref 22–32)
Calcium: 8.6 mg/dL — ABNORMAL LOW (ref 8.9–10.3)
Chloride: 106 mmol/L (ref 98–111)
Creatinine, Ser: 1.03 mg/dL — ABNORMAL HIGH (ref 0.44–1.00)
GFR, Estimated: 56 mL/min — ABNORMAL LOW (ref >60)
Glucose, Bld: 122 mg/dL — ABNORMAL HIGH (ref 70–99)
Potassium: 4.6 mmol/L (ref 3.5–5.1)
Sodium: 136 mmol/L (ref 135–145)

## 2022-06-06 MED ORDER — AZITHROMYCIN 250 MG PO TABS
500.0000 mg | ORAL_TABLET | Freq: Once | ORAL | Status: AC
  Administered 2022-06-06: 500 mg via ORAL
  Filled 2022-06-06: qty 2

## 2022-06-06 MED ORDER — ARFORMOTEROL TARTRATE 15 MCG/2ML IN NEBU
15.0000 ug | INHALATION_SOLUTION | Freq: Two times a day (BID) | RESPIRATORY_TRACT | Status: DC
  Administered 2022-06-06 – 2022-06-09 (×6): 15 ug via RESPIRATORY_TRACT
  Filled 2022-06-06 (×5): qty 2

## 2022-06-06 MED ORDER — REVEFENACIN 175 MCG/3ML IN SOLN
175.0000 ug | Freq: Every day | RESPIRATORY_TRACT | Status: DC
  Administered 2022-06-07 – 2022-06-09 (×3): 175 ug via RESPIRATORY_TRACT
  Filled 2022-06-06 (×3): qty 3

## 2022-06-06 MED ORDER — HYDROCOD POLI-CHLORPHE POLI ER 10-8 MG/5ML PO SUER: 5.0000 mL | Freq: Two times a day (BID) | ORAL | Status: DC | PRN

## 2022-06-06 MED ORDER — AZITHROMYCIN 250 MG PO TABS
250.0000 mg | ORAL_TABLET | Freq: Every day | ORAL | Status: DC
  Administered 2022-06-07 – 2022-06-09 (×3): 250 mg via ORAL
  Filled 2022-06-06 (×3): qty 1

## 2022-06-06 MED ORDER — GUAIFENESIN ER 600 MG PO TB12
1200.0000 mg | ORAL_TABLET | Freq: Two times a day (BID) | ORAL | Status: DC
Start: 2022-06-06 — End: 2022-06-09
  Administered 2022-06-06 – 2022-06-09 (×7): 1200 mg via ORAL
  Filled 2022-06-06 (×7): qty 2

## 2022-06-06 MED ORDER — BUDESONIDE 0.5 MG/2ML IN SUSP
0.5000 mg | Freq: Two times a day (BID) | RESPIRATORY_TRACT | Status: DC
Start: 2022-06-06 — End: 2022-06-09
  Administered 2022-06-06 – 2022-06-09 (×6): 0.5 mg via RESPIRATORY_TRACT
  Filled 2022-06-06 (×6): qty 2

## 2022-06-06 MED ORDER — BENZONATATE 100 MG PO CAPS
200.0000 mg | ORAL_CAPSULE | Freq: Three times a day (TID) | ORAL | Status: DC | PRN
Start: 2022-06-06 — End: 2022-06-09
  Administered 2022-06-07 – 2022-06-09 (×2): 200 mg via ORAL
  Filled 2022-06-06 (×2): qty 2

## 2022-06-06 MED ORDER — PANTOPRAZOLE SODIUM 40 MG PO TBEC
40.0000 mg | DELAYED_RELEASE_TABLET | Freq: Every day | ORAL | Status: DC
  Administered 2022-06-07 – 2022-06-09 (×3): 40 mg via ORAL
  Filled 2022-06-06 (×3): qty 1

## 2022-06-06 MED ORDER — SALINE SPRAY 0.65 % NA SOLN
2.0000 | Freq: Two times a day (BID) | NASAL | Status: DC
  Administered 2022-06-06 – 2022-06-08 (×4): 2 via NASAL
  Filled 2022-06-06: qty 44

## 2022-06-06 NOTE — Plan of Care (Signed)
  Problem: Education: Goal: Knowledge of General Education information will improve Description: Including pain rating scale, medication(s)/side effects and non-pharmacologic comfort measures Outcome: Progressing   Problem: Health Behavior/Discharge Planning: Goal: Ability to manage health-related needs will improve Outcome: Progressing   Problem: Clinical Measurements: Goal: Will remain free from infection Outcome: Progressing   

## 2022-06-06 NOTE — Plan of Care (Signed)
Pt is alert and oriented x 4. Pt was able to walk to bed with slight shortness of breath with exertion. Pt continues to have cough. Using flutter valve at bedside oxy approx every 4 hours and scheduled hycodan and tessalon pearles. Vitals stable.  Problem: Clinical Measurements: Goal: Respiratory complications will improve Outcome: Not Progressing   Problem: Education: Goal: Knowledge of General Education information will improve Description: Including pain rating scale, medication(s)/side effects and non-pharmacologic comfort measures Outcome: Progressing   Problem: Health Behavior/Discharge Planning: Goal: Ability to manage health-related needs will improve Outcome: Progressing   Problem: Clinical Measurements: Goal: Ability to maintain clinical measurements within normal limits will improve Outcome: Progressing Goal: Will remain free from infection Outcome: Progressing Goal: Diagnostic test results will improve Outcome: Progressing Goal: Cardiovascular complication will be avoided Outcome: Progressing   Problem: Activity: Goal: Risk for activity intolerance will decrease Outcome: Progressing   Problem: Nutrition: Goal: Adequate nutrition will be maintained Outcome: Progressing   Problem: Coping: Goal: Level of anxiety will decrease Outcome: Progressing   Problem: Elimination: Goal: Will not experience complications related to bowel motility Outcome: Progressing Goal: Will not experience complications related to urinary retention Outcome: Progressing   Problem: Pain Managment: Goal: General experience of comfort will improve Outcome: Progressing   Problem: Safety: Goal: Ability to remain free from injury will improve Outcome: Progressing   Problem: Skin Integrity: Goal: Risk for impaired skin integrity will decrease Outcome: Progressing   Problem: Education: Goal: Knowledge of disease or condition will improve Outcome: Progressing Goal: Knowledge of the  prescribed therapeutic regimen will improve Outcome: Progressing Goal: Individualized Educational Video(s) Outcome: Progressing   Problem: Activity: Goal: Ability to tolerate increased activity will improve Outcome: Progressing Goal: Will verbalize the importance of balancing activity with adequate rest periods Outcome: Progressing   Problem: Respiratory: Goal: Ability to maintain a clear airway will improve Outcome: Progressing Goal: Levels of oxygenation will improve Outcome: Progressing Goal: Ability to maintain adequate ventilation will improve Outcome: Progressing

## 2022-06-06 NOTE — Care Management Important Message (Signed)
Important Message  Patient Details  Name: Tammy [name] MRN: [id number] Date of Birth: 1946/04/05   Medicare Important Message Given:  Yes     Tommy Medal 06/06/2022, 10:22 AM

## 2022-06-06 NOTE — Progress Notes (Signed)
[facility]   Patient name: Tammy [name] Admit date: 06/01/2022  DOB: 02-Aug-1946 LOS: 5  MRN: [id number] Consult date: 06/02/2022  Referring provider: Dr. Wynetta Emery, [facility]: Dyspnea    History:  76 yo female former smoker with hx of asthma presented to [facility] with 4 days of worsening dyspnea with chest discomfort.  Noted to have wheezing.  She was found to have elevated troponin.  PCCM consulted to assist with respiratory management.  Past medical history:  HTN, HLD  Significant events:  7/05 Admit to SDU, start solumedrol and heparin gtt 7/06 Cardiology consulted for elevated troponin 7/08 heparin gtt stopped 7/10 cardiology sign off  Studies:  CT angio chest 06/01/22 >> possible filling defect in subsegmental artery LLL Echo 06/02/22 >> EF 70 to 75%, mod LVH, grade 1 DD, RVSP 40.3 mmHg Doppler legs b/l 06/02/22 >> no DVT CT angio chest 06/02/22 >> diffuse bronchial wall thickening, no PE V/Q scan 06/04/22 >> no PE  Micro:  COVID 7/05 >> negative RVP 7/06 >> negative  Lines:     Antibiotics:  Zithromax 7/10 >>   Consults:  Cardiology    Interim history:  Still has cough, chest congestion and wheeze.  Vital signs:  BP 135/60 (BP Location: Right Arm)   Pulse 74   Temp 98.5 F (36.9 C) (Oral)   Resp 18   Ht '5\' 5"'$  (1.651 m)   Wt 81.8 kg   SpO2 95%   BMI 30.01 kg/m   Intake/output:  I/O last 3 completed shifts: In: 780 [P.O.:780] Out: 2200 [Urine:2200]   Physical exam:   General - alert Eyes - pupils reactive ENT - no sinus tenderness, no stridor Cardiac - regular rate/rhythm, no murmur Chest - b/l expiratory wheezing Abdomen - soft, non tender, + bowel sounds Extremities - no cyanosis, clubbing, or edema Skin - no rashes Neuro - normal strength, moves extremities, follows commands Psych - normal mood and behavior  Best practice:   DVT - SQ heparin SUP - Protonix Nutrition - Heart healthy    Assessment/plan:   Acute asthma exacerbation. - add zithromax 7/10 - continue solumedrol 60 mg bid - change to yupelri, brovana, pulmicort - prn albuterol - continue singulair, mucinex  Allergic rhinitis. - flonase, singulair, claritin, nasal irrigation  Cough. - change tessalon and hycodan to prn  Elevated troponin likely from demand ischemia. Hx of HTN, HLD. - cardiology consulted  Resolved hospital problems:    Goals of care/Family discussions:  Code status: full  Updated pt's family at bedside  Labs:      Latest Ref Rng & Units 06/06/2022    5:38 AM 06/04/2022    3:55 AM 06/03/2022    4:11 AM  CMP  Glucose 70 - 99 mg/dL 122  142  163   BUN 8 - 23 mg/dL 26  34  32   Creatinine 0.44 - 1.00 mg/dL 1.03  1.03  1.13   Sodium 135 - 145 mmol/L 136  136  135   Potassium 3.5 - 5.1 mmol/L 4.6  4.4  4.5   Chloride 98 - 111 mmol/L 106  103  105   CO2 22 - 32 mmol/L '26  26  22   '$ Calcium 8.9 - 10.3 mg/dL 8.6  9.0  9.5        Latest Ref Rng & Units 06/06/2022    5:38 AM 06/04/2022    3:55 AM 06/03/2022    4:11 AM  CBC  WBC 4.0 -  10.5 K/uL 18.1  19.5  25.8   Hemoglobin 12.0 - 15.0 g/dL 12.7  11.9  12.6   Hematocrit 36.0 - 46.0 % 39.4  37.1  39.8   Platelets 150 - 400 K/uL 307  316  322     ABG    Component Value Date/Time   HCO3 24.2 06/01/2022 2012   ACIDBASEDEF 0.7 06/01/2022 2012   O2SAT 86.1 06/01/2022 2012     Signature:  Chesley Mires, MD [place] Pager - [phone] 06/06/2022, 1:36 PM

## 2022-06-06 NOTE — Progress Notes (Signed)
PROGRESS NOTE   Tammy [name]  PTW:[id number] DOB: 1946/08/09 DOA: 06/01/2022 PCP: Celene Squibb, MD   Chief Complaint  Patient presents with   Shortness of Breath   Level of care: Med-Surg  Brief Admission History:   76 y.o. female with medical history significant of hypertension and controlled asthma who presents to ED with a chief complaint of dyspnea.  Patient reportedly was quite short of breath that hesitation, only able to speak in 2-3 word phrases, and tripoding.  At the time of my exam patient is no longer tripoding, but still cannot complete a sentence without having to close her eyes and focus on breathing.  She has an audible wheeze from the doorway.  Due to her work of breathing, history is quite limited.  She reports that her dyspnea was gradual in onset and progressively worsening since last night.  She was not able to lay flat at all last night.  She has not noticed any swelling in her legs.  She has no history of CHF/fluid overload.  Her dyspnea was also worse on exertion.  It became acutely worse in the middle of the night last night.  She denies chest pain.  Her sister, at bedside, provides some extra history.  She reports that patient has recently had family members staying in her home from [place] places including [place], [place], [place].    Assessment and Plan: * Acute respiratory failure with hypoxia (HCC) -secondary to severe asthma exacerbation -Patient has no oxygen requirement at baseline -wean oxygen as able - consulted to [facility] pulmonologist Dr. Melvyn Novas for assistance with diagnosis and mgmt.  -BiPAP ordered if needed -Continue Singulair, steroids, bronchodilators -With the wheezing, and seems to be asthma exacerbation -Troponin elevation likely demand ischemia -PE has been ruled out.  V/Q scan with no evidence of PE.  2nd CTA chest no finding of PE.  -COVID negative -respiratory pathogen panel negative  -Continue to monitor closely  Elevated troponin -  Troponin elevation likely demand ischemia in setting of respiratory distress (acute) -Continue to cycle troponins -EKG shows sinus tachycardia with a rate of 107, QTc 466, there is baseline artifact but appears to be some ST depression in the inferior leads -Denying chest pain at this time -Cardiology consulted and would like to get coronary CT when more clinically stable from respiratory standpoint.  -This is felt to be demand ischemia in the setting of acute hypoxic respiratory failure with possible PE -Repeat EKG for any new episodes of chest pain -Continue to monitor -completed 48 hours of IV heparin   Asthma, chronic, unspecified asthma severity, with acute exacerbation - Wheezing, cough, severe SOB has been slowly getting better but not near baseline.   -COVID negative.  Resp panel negative.  -Only new exposure in the home with recent visitors from out of the country -Patient is not a smoker -Chest x-ray shows no active disease -There is a leukocytosis, patient does have a mostly nonproductive cough, but has been afebrile with no sick contacts-check a procalcitonin which was <0.10 and reassuring -Continue steroids, nebs, mucolytics, singulair, cough suppressants -Continue to monitor in stepdown ICU  Hyperlipidemia - Continue Crestor  Leukocytosis - White blood cell count remains elevated but is coming down -leukemoid reaction from high dose steroids -No active disease on chest x-ray -COVID negative -Procalcitonin <0.10   Pulmonary Embolus RULED OUT - Presumed PE with possible subsegmental PE described on initial CTA chest -radiologist recommended repeat study in 24 hours for confirmation but repeat CT did  not show PE due to timing of contrast administration.   Ultrasound bilateral LE negative for DVT.  -Ultrasound DVT negative for clot in legs. -V/Q scan done on 7/8 with no evidence of PE also reassuring that there is no PE -Echo reassuring : no heart strain noted -DC  heparin/apixaban and full anticoagulation at this point as we have ruled out PE  Essential hypertension - Patient is on an ARB and HCTZ at baseline which is being held due to soft BP and bump in creatinine -Systolic blood pressure maintaining in the low 100s at this time, holding home medication -Continue to monitor; so far BPs have been stable  DVT prophylaxis: IV heparin  Code Status: Full  Family Communication: son at bedside Disposition: Status is: Inpatient Remains inpatient appropriate because: Intensity is deserving of inpatient status   Consultants:  PCCM Dr. Melvyn Novas , [facility] Procedures:   Antimicrobials:    Subjective: Pt is breathing a bit better overall but not back to baseline.    Objective: Vitals:   06/06/22 0425 06/06/22 0705 06/06/22 0900 06/06/22 1454  BP: (!) 151/75  135/60 138/74  Pulse: 76  74 69  Resp: '18  18 18  '$ Temp: 97.9 F (36.6 C)  98.5 F (36.9 C) 98 F (36.7 C)  TempSrc: Oral  Oral   SpO2: 94% 95% 95% 93%  Weight:      Height:        Intake/Output Summary (Last 24 hours) at 06/06/2022 1904 Last data filed at 06/06/2022 1700 Gross per 24 hour  Intake 440 ml  Output 700 ml  Net -260 ml   Filed Weights   06/01/22 2200 06/03/22 0500 06/05/22 0500  Weight: 79.4 kg 78.9 kg 81.8 kg   Examination:  General exam: persistent respiratory distress, able to sit up in chair.  Loud frequent coughing and wheezing.  Respiratory system: diffuse fine inspiratory/expiratory wheezing bilateral. Cardiovascular system: normal S1 & S2 heard. No JVD, murmurs, rubs, gallops or clicks. No pedal edema. Gastrointestinal system: Abdomen is nondistended, soft and nontender. No organomegaly or masses felt. Normal bowel sounds heard. Central nervous system: Alert and oriented. No focal neurological deficits. Extremities: Symmetric 5 x 5 power. Skin: No rashes, lesions or ulcers. Psychiatry: Judgement and insight appear normal. Mood & affect appropriate.   Data  Reviewed: I have personally reviewed following labs and imaging studies  CBC: Recent Labs  Lab 06/01/22 1514 06/02/22 0334 06/03/22 0411 06/04/22 0355 06/06/22 0538  WBC 19.0* 14.5* 25.8* 19.5* 18.1*  NEUTROABS 14.0*  --   --   --   --   HGB 13.9 12.6 12.6 11.9* 12.7  HCT 44.4 39.6 39.8 37.1 39.4  MCV 80.3 80.2 80.7 79.8* 79.8*  PLT 364 336 322 316 578    Basic Metabolic Panel: Recent Labs  Lab 06/01/22 1514 06/02/22 0334 06/03/22 0411 06/04/22 0355 06/06/22 0538  NA 138 136 135 136 136  K 3.7 3.1* 4.5 4.4 4.6  CL 101 104 105 103 106  CO2 '27 23 22 26 26  '$ GLUCOSE 167* 179* 163* 142* 122*  BUN 12 15 32* 34* 26*  CREATININE 0.81 0.90 1.13* 1.03* 1.03*  CALCIUM 9.5 9.3 9.5 9.0 8.6*  MG  --  2.2 2.4  --   --     CBG: No results for input(s): "GLUCAP" in the last 168 hours.  Recent Results (from the past 240 hour(s))  SARS Coronavirus 2 by RT PCR (hospital order, performed in [facility] hospital lab) *cepheid single result  test* Anterior Nasal Swab     Status: None   Collection Time: 06/01/22  8:01 PM   Specimen: Anterior Nasal Swab  Result Value Ref Range Status   SARS Coronavirus 2 by RT PCR NEGATIVE NEGATIVE Final    Comment: (NOTE) SARS-CoV-2 target nucleic acids are NOT DETECTED.  The SARS-CoV-2 RNA is generally detectable in upper and lower respiratory specimens during the acute phase of infection. The lowest concentration of SARS-CoV-2 viral copies this assay can detect is 250 copies / mL. A negative result does not preclude SARS-CoV-2 infection and should not be used as the sole basis for treatment or other patient management decisions.  A negative result may occur with improper specimen collection / handling, submission of specimen other than nasopharyngeal swab, presence of viral mutation(s) within the areas targeted by this assay, and inadequate number of viral copies (<250 copies / mL). A negative result must be combined with clinical observations,  patient history, and epidemiological information.  Fact Sheet for Patients:   https://www.patel.info/  Fact Sheet for Healthcare Providers: https://hall.com/  This test is not yet approved or  cleared by the [place] FDA and has been authorized for detection and/or diagnosis of SARS-CoV-2 by FDA under an Emergency Use Authorization (EUA).  This EUA will remain in effect (meaning this test can be used) for the duration of the COVID-19 declaration under Section 564(b)(1) of the Act, 21 U.S.C. section 360bbb-3(b)(1), unless the authorization is terminated or revoked sooner.  Performed at [facility], [address] [place], [place] [address]   MRSA Next Gen by PCR, Nasal     Status: None   Collection Time: 06/01/22  9:35 PM   Specimen: Nasal Mucosa; Nasal Swab  Result Value Ref Range Status   MRSA by PCR Next Gen NOT DETECTED NOT DETECTED Final    Comment: (NOTE) The GeneXpert MRSA Assay (FDA approved for NASAL specimens only), is one component of a comprehensive MRSA colonization surveillance program. It is not intended to diagnose MRSA infection nor to guide or monitor treatment for MRSA infections. Test performance is not FDA approved in patients less than 55 years old. Performed at [facility], [address] [place], [place] [address]   Respiratory (~20 pathogens) panel by PCR     Status: None   Collection Time: 06/02/22  8:33 AM   Specimen: Nasopharyngeal Swab; Respiratory  Result Value Ref Range Status   Adenovirus NOT DETECTED NOT DETECTED Final   Coronavirus 229E NOT DETECTED NOT DETECTED Final    Comment: (NOTE) The Coronavirus on the Respiratory Panel, DOES NOT test for the novel  Coronavirus (2019 nCoV)    Coronavirus HKU1 NOT DETECTED NOT DETECTED Final   Coronavirus NL63 NOT DETECTED NOT DETECTED Final   Coronavirus OC43 NOT DETECTED NOT DETECTED Final   Metapneumovirus NOT DETECTED NOT DETECTED Final    Rhinovirus / Enterovirus NOT DETECTED NOT DETECTED Final   Influenza A NOT DETECTED NOT DETECTED Final   Influenza B NOT DETECTED NOT DETECTED Final   Parainfluenza Virus 1 NOT DETECTED NOT DETECTED Final   Parainfluenza Virus 2 NOT DETECTED NOT DETECTED Final   Parainfluenza Virus 3 NOT DETECTED NOT DETECTED Final   Parainfluenza Virus 4 NOT DETECTED NOT DETECTED Final   Respiratory Syncytial Virus NOT DETECTED NOT DETECTED Final   Bordetella pertussis NOT DETECTED NOT DETECTED Final   Bordetella Parapertussis NOT DETECTED NOT DETECTED Final   Chlamydophila pneumoniae NOT DETECTED NOT DETECTED Final   Mycoplasma pneumoniae NOT DETECTED NOT DETECTED  Final    Comment: Performed at [facility], [place] [address], [place], [place] [address]     Radiology Studies: No results found.  Scheduled Meds:  arformoterol  15 mcg Nebulization BID   aspirin EC  81 mg Oral Daily   [START ON 06/07/2022] azithromycin  250 mg Oral Daily   budesonide (PULMICORT) nebulizer solution  0.5 mg Nebulization BID   Chlorhexidine Gluconate Cloth  6 each Topical Q0600   guaiFENesin  1,200 mg Oral BID   heparin injection (subcutaneous)  5,000 Units Subcutaneous Q8H   loratadine  10 mg Oral Daily   melatonin  6 mg Oral QHS   methylPREDNISolone (SOLU-MEDROL) injection  60 mg Intravenous Q12H   montelukast  10 mg Oral QHS   [START ON 06/07/2022] pantoprazole  40 mg Oral Daily   pneumococcal 20-valent conjugate vaccine  0.5 mL Intramuscular Tomorrow-1000   polyethylene glycol  17 g Oral Daily   revefenacin  175 mcg Nebulization Daily   rosuvastatin  5 mg Oral Once per day on Sun Thu   sodium chloride  2 spray Each Nare BID   Continuous Infusions:   LOS: 5 days     Irwin Brakeman, MD How to contact the [facility] Attending or Consulting provider [place] or covering provider during after hours [place], for this patient?  Check the care team in [facility] and look for a) attending/consulting [facility] provider listed and b)  the [facility] team listed Log into [email or web address] and use [place]'s universal password to access. If you do not have the password, please contact the hospital operator. Locate the [facility] provider you are looking for under [facility] and page to a number that you can be directly reached. If you still have difficulty reaching the provider, please page the [facility] (Director on Call) for the Hospitalists listed on amion for assistance.  06/06/2022, 7:04 PM

## 2022-06-06 NOTE — Progress Notes (Addendum)
Progress Note  Patient Name: Tammy [name] Date of Encounter: 06/06/2022  [facility] Cardiologist: Kate Sable, MD (Inactive)    Subjective   Still coughing and wheezing  Inpatient Medications    Scheduled Meds:  aspirin EC  81 mg Oral Daily   benzonatate  200 mg Oral TID   Chlorhexidine Gluconate Cloth  6 each Topical Q0600   chlorpheniramine-HYDROcodone  5 mL Oral Q6H   dextromethorphan-guaiFENesin  2 tablet Oral BID   heparin injection (subcutaneous)  5,000 Units Subcutaneous Q8H   ipratropium-albuterol  3 mL Nebulization QID   loratadine  10 mg Oral Daily   melatonin  6 mg Oral QHS   methylPREDNISolone (SOLU-MEDROL) injection  60 mg Intravenous Q12H   montelukast  10 mg Oral QHS   pantoprazole  40 mg Oral BID AC   pneumococcal 20-valent conjugate vaccine  0.5 mL Intramuscular Tomorrow-1000   polyethylene glycol  17 g Oral Daily   rosuvastatin  5 mg Oral Once per day on Sun Thu   Continuous Infusions:  PRN Meds: albuterol, morphine injection, ondansetron **OR** ondansetron (ZOFRAN) IV, mouth rinse, oxyCODONE   Vital Signs    Vitals:   06/06/22 0000 06/06/22 0045 06/06/22 0425 06/06/22 0705  BP: 140/68 (!) 152/90 (!) 151/75   Pulse: 73 82 76   Resp: '12 18 18   '$ Temp:  97.9 F (36.6 C) 97.9 F (36.6 C)   TempSrc:  Oral Oral   SpO2: 93% 96% 94% 95%  Weight:      Height:        Intake/Output Summary (Last 24 hours) at 06/06/2022 0843 Last data filed at 06/05/2022 2000 Gross per 24 hour  Intake 680 ml  Output 900 ml  Net -220 ml      06/05/2022    5:00 AM 06/03/2022    5:00 AM 06/01/2022   10:00 PM  Last 3 Weights  Weight (lbs) 180 lb 5.4 oz 173 lb 15.1 oz 175 lb 0.7 oz  Weight (kg) 81.8 kg 78.9 kg 79.4 kg      Telemetry    NSR - Personally Reviewed  ECG       Physical Exam    GEN: No acute distress.   Neck: No JVD Cardiac: RRR, no murmurs, rubs, or gallops.  Respiratory: diffuse wheezing GI: Soft, nontender, non-distended  MS: No  edema; No deformity. Neuro:  Nonfocal  Psych: Normal affect   Labs    High Sensitivity Troponin:   Recent Labs  Lab 06/01/22 1650 06/01/22 2011 06/02/22 0334 06/02/22 0839 06/03/22 0411  TROPONINIHS 270* 847* 2,083* 2,438* 1,034*     Chemistry Recent Labs  Lab 06/01/22 1514 06/02/22 0334 06/03/22 0411 06/04/22 0355 06/06/22 0538  NA 138 136 135 136 136  K 3.7 3.1* 4.5 4.4 4.6  CL 101 104 105 103 106  CO2 '27 23 22 26 26  '$ GLUCOSE 167* 179* 163* 142* 122*  BUN 12 15 32* 34* 26*  CREATININE 0.81 0.90 1.13* 1.03* 1.03*  CALCIUM 9.5 9.3 9.5 9.0 8.6*  MG  --  2.2 2.4  --   --   PROT 8.2* 7.5  --   --   --   ALBUMIN 4.6 4.0  --   --   --   AST 36 42*  --   --   --   ALT 28 28  --   --   --   ALKPHOS 45 37*  --   --   --  BILITOT 0.7 0.3  --   --   --   GFRNONAA >60 >60 50* 56* 56*  ANIONGAP '10 9 8 7 '$ 4*    Lipids  Recent Labs  Lab 06/03/22 0411  CHOL 177  TRIG 79  HDL 52  LDLCALC 109*  CHOLHDL 3.4    Hematology Recent Labs  Lab 06/03/22 0411 06/04/22 0355 06/06/22 0538  WBC 25.8* 19.5* 18.1*  RBC 4.93 4.65 4.94  HGB 12.6 11.9* 12.7  HCT 39.8 37.1 39.4  MCV 80.7 79.8* 79.8*  MCH 25.6* 25.6* 25.7*  MCHC 31.7 32.1 32.2  RDW 14.9 15.0 14.7  PLT 322 316 307   Thyroid No results for input(s): "TSH", "FREET4" in the last 168 hours.  BNP Recent Labs  Lab 06/01/22 1514  BNP 93.0    DDimer No results for input(s): "DDIMER" in the last 168 hours.   Radiology    NM Pulmonary Perfusion  Result Date: 06/04/2022 CLINICAL DATA:  Chest pain and cough. EXAM: NUCLEAR MEDICINE PERFUSION LUNG SCAN TECHNIQUE: Perfusion images were obtained in multiple projections after intravenous injection of radiopharmaceutical. Ventilation scans intentionally deferred if perfusion scan and chest x-ray adequate for interpretation during COVID 19 epidemic. RADIOPHARMACEUTICALS:  4.4 mCi Tc-70mMAA IV COMPARISON:  Chest CTA 06/02/2022 FINDINGS: Heterogeneous perfusion in the lung  apices may reflect an underlying component of emphysema. No peripheral segmental perfusion defect in either lung. IMPRESSION: No scintigraphic evidence for pulmonary embolus. Electronically Signed   By: EMisty StanleyM.D.   On: 06/04/2022 12:45    Cardiac Studies      2D echo 06/02/22   1. Left ventricular ejection fraction, by estimation, is 70 to 75%. The  left ventricle has hyperdynamic function. The left ventricle has no  regional wall motion abnormalities. There is moderate hypertrophy of the  basal septal segment. The rest of the LV   segments demonstrate mild left ventricular hypertrophy. Left ventricular  diastolic parameters are consistent with Grade I diastolic dysfunction  (impaired relaxation).   2. Right ventricular systolic function is normal. The right ventricular  size is normal. There is mildly elevated pulmonary artery systolic  pressure. The estimated right ventricular systolic pressure is 467.1mmHg.   3. The mitral valve is grossly normal. Trivial mitral valve  regurgitation.   4. The aortic valve is tricuspid. There is mild calcification of the  aortic valve. There is mild thickening of the aortic valve. Aortic valve  regurgitation is not visualized. Aortic valve sclerosis/calcification is  present, without any evidence of  aortic stenosis.   5. The inferior vena cava is dilated in size with >50% respiratory  variability, suggesting right atrial pressure of 8 mmHg.   Comparison(s): No prior Echocardiogram.   Patient Profile     76y.o. female  with  HTN and asthma admitted with severe respiratory distress and coughing requiring supplemental O2; clinical presentation c/w asthma exacerbation. Initial CTA raised question of small filling defect, repeat CTA could not re-evaluate this area. LE venous duplex negative. Pulmonology on board as primary presentation felt pulmonary. Cardiology consulted for elevated troponin peak 2,438.    Assessment & Plan     1.  Respiratory distress with shortness of breath and spastic coughing, concerned for asthma exacerbation - exam remains consistent with pulmonary decompensation - 2D echo and normal BNP argue against HF - resp pathogen panel negative including pertussis; Covid-19 swab negative upon arrival - further review of repeat CT findings/anticoag plan per primary team - remains quite wheezy -  per IM/pulm   2. Elevated troponin/suspected type 2 MI - hsTroponin peaked at 2,438 in setting of physiologic demands of respiratory failure. Demand ischemia suspected but cannot fully exclude underlying CAD. Not a candidate for myoview due to wheezing (Lexiscan or exercise) - will arrange outpatient coronary CTA when respiratory status improved  - continue low dose ASA - no beta blocker due to bronchospasm - LDL 109, on home statin dose 2x/week, can review plan as OP when clinically more stable - 2D echo EF 70-75%, no RWMA, mild LVH, G1DD, mildly elevated PASP (may be in the setting of resp disease), mild calcification of AV, dilated IVC   3. Essential HTN-up today with meds on hold   with mild AKI, recent hypokalemia - potassium improved, magnesium normal - Cr 1.03 -  losartan and HCTZ on hold, consider restarting low dose losartan and following renal.   5. Rare PVCs, brief atrial tach 7/6, rare sinus pause-NSR now - no sustained or clinically significant events - lytes OK  [facility] will sign off.   Medication Recommendations:    Other recommendations (labs, testing, etc):  will schedule coronary CTA at OP visit Follow up as an outpatient:  will arrange OP f/u in [place] office  For questions or updates, please contact [place] Please consult [email or web address] for contact info under        Signed, Ermalinda Barrios, PA-C  06/06/2022, 8:43 AM     Patient seen and examined   I agree with findings as noted aby M Lenze above  Pt coughing incessently   while I was in room [facility] CP  On  exam, Lungs with decreased airflow  Cardiac exam   RRR   No S3    Abd is supple  Ext are without edema  Elevated troponin  Most likely due to demand ischemia in setting of resp decompensatoin Given peak greater than 2000 wold recomm further testing as outpt   Only when resp improved    Will set up for CT angio in near future    Keep on statin   Takes 2 days per week   Will review after CT  Will sign off for now    Please  call with questions   Dorris Carnes  MD

## 2022-06-06 NOTE — Plan of Care (Signed)
  Problem: Acute Rehab PT Goals(only PT should resolve) Goal: Patient Will Transfer Sit To/From Stand Outcome: Progressing Flowsheets (Taken 06/06/2022 1211) Patient will transfer sit to/from stand: with supervision Goal: Pt Will Transfer Bed To Chair/Chair To Bed Outcome: Progressing Flowsheets (Taken 06/06/2022 1211) Pt will Transfer Bed to Chair/Chair to Bed: with supervision Goal: Pt Will Ambulate Outcome: Progressing Flowsheets (Taken 06/06/2022 1211) Pt will Ambulate:  > 125 feet  with supervision  with least restrictive assistive device Goal: Pt/caregiver will Perform Home Exercise Program Outcome: Progressing Flowsheets (Taken 06/06/2022 1211) Pt/caregiver will Perform Home Exercise Program:  For increased strengthening  For improved balance  With Supervision, verbal cues required/provided   Tammy [name]. Hartnett-Rands, MS, PT Per [place] [phone] 06/06/2022

## 2022-06-06 NOTE — Evaluation (Signed)
Physical Therapy Evaluation Patient Details Name: Tammy [name] MRN: [id number] DOB: July 16, 1946 Today's Date: 06/06/2022  History of Present Illness  76 y.o. female with medical history significant of hypertension and controlled asthma who presents to ED with a chief complaint of dyspnea.  Patient reportedly was quite short of breath that hesitation, only able to speak in 2-3 word phrases, and tripoding.  At the time of my exam patient is no longer tripoding, but still cannot complete a sentence without having to close her eyes and focus on breathing.  She has an audible wheeze from the doorway.  Due to her work of breathing, history is quite limited.  She reports that her dyspnea was gradual in onset and progressively worsening since last night.  She was not able to lay flat at all last night.  She has not noticed any swelling in her legs.  She has no history of CHF/fluid overload.  Her dyspnea was also worse on exertion.  It became acutely worse in the middle of the night last night.  She denies chest pain.  Her sister, at bedside, provides some extra history.  She reports that patient has recently had family members staying in her home from [place] places including [place], [place], [place].   Clinical Impression  Patient supine in bed upon therapist arrival and agreeable to participating in PT evaluation today. Family members sisters, son and daughter-in-law present in room during session. Patient performed well with bed mobility and transfers demonstrating good safety holding on to bed rail upon initial standing until she was steady on her feet. Patient was able to ambulate 200 feet using a slow, somewhat labored cadence without an assistive device and min guard for safety in an unfamiliar environment. Patient limited by fatigue and demonstrated dyspnea and coughing on exertion. Patient on room air throughout session.  Patient motivated to move. Patient would continue to benefit from skilled physical  therapy in current environment and next venue to continue return to prior function and increase strength, endurance, balance, coordination, and functional mobility and gait skills.       Recommendations for follow up therapy are one component of a multi-disciplinary discharge planning process, led by the attending physician.  Recommendations may be updated based on patient status, additional functional criteria and insurance authorization.  Follow Up Recommendations No PT follow up      Assistance Recommended at Discharge PRN  Patient can return home with the following  A little help with walking and/or transfers;A little help with bathing/dressing/bathroom;Assistance with cooking/housework;Assist for transportation;Help with stairs or ramp for entrance    Equipment Recommendations Cane  Recommendations for Other Services       Functional Status Assessment Patient has had a recent decline in their functional status and demonstrates the ability to make significant improvements in function in a reasonable and predictable amount of time.     Precautions / Restrictions Restrictions Weight Bearing Restrictions: No      Mobility  Bed Mobility Overal bed mobility: Modified Independent   General bed mobility comments: increased time    Transfers Overall transfer level: Needs assistance Equipment used: None Transfers: Sit to/from Stand, Bed to chair/wheelchair/BSC Sit to Stand: Supervision   Step pivot transfers: Supervision   Anterior-Posterior transfers: Modified independent (Device/Increase time)   General transfer comment: increased time to complete power up; initially stood holding on to bed rail to ensure she remained steady    Ambulation/Gait Ambulation/Gait assistance: Min guard Gait Distance (Feet): 200 Feet Assistive device: None Gait  Pattern/deviations: Step-through pattern, Decreased step length - right, Decreased step length - left, Decreased stride length,  Narrow base of support Gait velocity: decreased     General Gait Details: slow, somewhat labored cadence without an assistive device and min guard for safety in an unfamiliar environment; limited by fatigue; dyspnea and coughing on exertion; on room air throughout session  Stairs      Wheelchair Mobility    Modified Rankin (Stroke Patients Only)       Balance Overall balance assessment: Mild deficits observed, not formally tested         Pertinent Vitals/Pain Pain Assessment Pain Assessment: 0-10 Pain Score: 8  Pain Location: abdomen from  shots and chest from coughing Pain Intervention(s): Limited activity within patient's tolerance, Monitored during session    Home Living Family/patient expects to be discharged to:: Private residence Living Arrangements: Alone Available Help at Discharge: Family;Available 24 hours/day Type of Home: House Home Access: Stairs to enter   [employer] of Steps: 1   Home Layout: One level Home Equipment: Hand held shower head      Prior Function Prior Level of Function : Driving;Independent/Modified Independent         Hand Dominance        Extremity/Trunk Assessment   Upper Extremity Assessment Upper Extremity Assessment: Overall WFL for tasks assessed    Lower Extremity Assessment Lower Extremity Assessment: Overall WFL for tasks assessed    Cervical / Trunk Assessment Cervical / Trunk Assessment: Normal  Communication   Communication: No difficulties  Cognition Arousal/Alertness: Awake/alert Behavior During Therapy: WFL for tasks assessed/performed Overall Cognitive Status: Within Functional Limits for tasks assessed          General Comments      Exercises     Assessment/Plan    PT Assessment Patient needs continued PT services  PT Problem List Decreased mobility;Decreased activity tolerance;Decreased balance;Pain       PT Treatment Interventions DME instruction;Therapeutic exercise;Gait  training;Balance training;Neuromuscular re-education;Functional mobility training;Therapeutic activities;Patient/family education    PT Goals (Current goals can be found in the Care Plan section)  Acute Rehab PT Goals Patient Stated Goal: Go home PT Goal Formulation: With patient/family Time For Goal Achievement: 06/20/22 Potential to Achieve Goals: Good    Frequency Min 3X/week        AM-PAC PT "6 Clicks" Mobility  Outcome Measure Help needed turning from your back to your side while in a flat bed without using bedrails?: None Help needed moving from lying on your back to sitting on the side of a flat bed without using bedrails?: None Help needed moving to and from a bed to a chair (including a wheelchair)?: A Little Help needed standing up from a chair using your arms (e.g., wheelchair or bedside chair)?: A Little Help needed to walk in hospital room?: A Little Help needed climbing 3-5 steps with a railing? : A Little 6 Click Score: 20    End of Session Equipment Utilized During Treatment: Gait belt Activity Tolerance: Patient tolerated treatment well;Patient limited by fatigue Patient left: in chair;with call bell/phone within reach;with family/visitor present Nurse Communication: Mobility status PT Visit Diagnosis: Unsteadiness on feet (R26.81);Other abnormalities of gait and mobility (R26.89)    Time: 1115-1150 PT Time Calculation (min) (ACUTE ONLY): 35 min   Charges:   PT Evaluation $PT Eval Low Complexity: 1 Low PT Treatments $Therapeutic Activity: 8-22 mins       Floria Raveling. Hartnett-Rands, MS, PT Per [place] [phone]  Delisa Finck  Hartnett-Rands 06/06/2022, 12:07 PM

## 2022-06-07 DIAGNOSIS — E782 Mixed hyperlipidemia: Secondary | ICD-10-CM | POA: Diagnosis not present

## 2022-06-07 DIAGNOSIS — J4551 Severe persistent asthma with (acute) exacerbation: Secondary | ICD-10-CM

## 2022-06-07 DIAGNOSIS — I10 Essential (primary) hypertension: Secondary | ICD-10-CM | POA: Diagnosis not present

## 2022-06-07 DIAGNOSIS — J9601 Acute respiratory failure with hypoxia: Secondary | ICD-10-CM | POA: Diagnosis not present

## 2022-06-07 DIAGNOSIS — J45901 Unspecified asthma with (acute) exacerbation: Secondary | ICD-10-CM | POA: Diagnosis not present

## 2022-06-07 MED ORDER — ACETAMINOPHEN 325 MG PO TABS
650.0000 mg | ORAL_TABLET | Freq: Once | ORAL | Status: AC
Start: 2022-06-07 — End: 2022-06-07
  Administered 2022-06-07: 650 mg via ORAL
  Filled 2022-06-07: qty 2

## 2022-06-07 MED ORDER — HYDRALAZINE HCL 20 MG/ML IJ SOLN: 5.0000 mg | INTRAMUSCULAR | Status: DC | PRN

## 2022-06-07 MED ORDER — SACCHAROMYCES BOULARDII 250 MG PO CAPS
250.0000 mg | ORAL_CAPSULE | Freq: Two times a day (BID) | ORAL | Status: DC
  Administered 2022-06-07 – 2022-06-09 (×5): 250 mg via ORAL
  Filled 2022-06-07 (×5): qty 1

## 2022-06-07 MED ORDER — ACETAMINOPHEN 325 MG PO TABS: 650.0000 mg | ORAL_TABLET | Freq: Four times a day (QID) | ORAL | Status: DC | PRN

## 2022-06-07 MED ORDER — LOSARTAN POTASSIUM 50 MG PO TABS
100.0000 mg | ORAL_TABLET | Freq: Every day | ORAL | Status: DC
  Administered 2022-06-07 – 2022-06-09 (×3): 100 mg via ORAL
  Filled 2022-06-07 (×3): qty 2

## 2022-06-07 MED ORDER — LOSARTAN POTASSIUM-HCTZ 100-25 MG PO TABS
1.0000 | ORAL_TABLET | Freq: Every day | ORAL | Status: DC
Start: 2022-06-07 — End: 2022-06-07

## 2022-06-07 MED ORDER — HYDROCHLOROTHIAZIDE 25 MG PO TABS
25.0000 mg | ORAL_TABLET | Freq: Every day | ORAL | Status: DC
  Administered 2022-06-07 – 2022-06-09 (×3): 25 mg via ORAL
  Filled 2022-06-07 (×3): qty 1

## 2022-06-07 NOTE — Progress Notes (Signed)
[facility]   Patient name: Tammy [name] Admit date: 06/01/2022  DOB: [birth year] LOS: 6  MRN: [id number] Consult date: 06/02/2022  Referring provider: Dr. Wynetta Emery, [facility]: Dyspnea    History:  76 yo female former smoker with hx of asthma presented to [facility] with 4 days of worsening dyspnea with chest discomfort.  Noted to have wheezing.  She was found to have elevated troponin.  PCCM consulted to assist with respiratory management.  Past medical history:  HTN, HLD  Significant events:  7/05 Admit to SDU, start solumedrol and heparin gtt 7/06 Cardiology consulted for elevated troponin 7/08 heparin gtt stopped 7/10 cardiology sign off  Studies:  CT angio chest 06/01/22 >> possible filling defect in subsegmental artery LLL Echo 06/02/22 >> EF 70 to 75%, mod LVH, grade 1 DD, RVSP 40.3 mmHg Doppler legs b/l 06/02/22 >> no DVT CT angio chest 06/02/22 >> diffuse bronchial wall thickening, no PE V/Q scan 06/04/22 >> no PE  Micro:  COVID 7/05 >> negative RVP 7/06 >> negative  Lines:     Antibiotics:  Zithromax 7/10 >>   Consults:  Cardiology    Interim history:  Feels med changes have helped her breathing.  Has cough, but easier to bring up sputum.  Not having wheeze as much.    Vital signs:  BP (!) 163/75 (BP Location: Right Arm)   Pulse 75   Temp 98.6 F (37 C)   Resp 16   Ht '5\' 5"'$  (1.651 m)   Wt 81.8 kg   SpO2 98%   BMI 30.01 kg/m   Intake/output:  I/O last 3 completed shifts: In: 680 [P.O.:680] Out: 1450 [Urine:1450]   Physical exam:   General - alert Eyes - pupils reactive ENT - no sinus tenderness, no stridor Cardiac - regular rate/rhythm, no murmur Chest - scattered rhonchi, no wheeze Abdomen - soft, non tender, + bowel sounds Extremities - no cyanosis, clubbing, or edema Skin - no rashes Neuro - normal strength, moves extremities, follows commands Psych - normal mood and behavior    Best  practice:   DVT - SQ heparin SUP - Protonix Nutrition - Heart healthy   Assessment/plan:   Acute asthma exacerbation. - day 2 of zithromax; add probiotic while on antibiotic - continue solumedrol >> might be able to transition to prednisone in next 24 to 48 hours - continue yupleri, brovana, pulmicort - prn albuterol - continue flutter valve, singulair, mucinex  Allergic rhinitis. - continue flonase, singulair, claritin, nasal irrigation flonase, singulair, claritin, nasal irrigation  Cough. - prn tessalon and hycodan  Elevated troponin likely from demand ischemia. Hx of HTN, HLD. - per primary team - cardiology signed off  Resolved hospital problems:    Goals of care/Family discussions:  Code status: full  Updated pt's family at bedside  Labs:      Latest Ref Rng & Units 06/06/2022    5:38 AM 06/04/2022    3:55 AM 06/03/2022    4:11 AM  CMP  Glucose 70 - 99 mg/dL 122  142  163   BUN 8 - 23 mg/dL 26  34  32   Creatinine 0.44 - 1.00 mg/dL 1.03  1.03  1.13   Sodium 135 - 145 mmol/L 136  136  135   Potassium 3.5 - 5.1 mmol/L 4.6  4.4  4.5   Chloride 98 - 111 mmol/L 106  103  105   CO2 22 - 32 mmol/L 26  26  22  Calcium 8.9 - 10.3 mg/dL 8.6  9.0  9.5        Latest Ref Rng & Units 06/06/2022    5:38 AM 06/04/2022    3:55 AM 06/03/2022    4:11 AM  CBC  WBC 4.0 - 10.5 K/uL 18.1  19.5  25.8   Hemoglobin 12.0 - 15.0 g/dL 12.7  11.9  12.6   Hematocrit 36.0 - 46.0 % 39.4  37.1  39.8   Platelets 150 - 400 K/uL 307  316  322     ABG    Component Value Date/Time   HCO3 24.2 06/01/2022 2012   ACIDBASEDEF 0.7 06/01/2022 2012   O2SAT 86.1 06/01/2022 2012     Signature:  Chesley Mires, MD [place] Pager - [phone] 06/07/2022, 1:47 PM

## 2022-06-07 NOTE — Progress Notes (Signed)
Patient has a headache. No PRN orders for this. MD Zierle-Ghosh notified. Received order for one time dose of 650 mg Tylenol. Will continue to monitor.

## 2022-06-07 NOTE — Progress Notes (Signed)
PROGRESS NOTE   Tammy [name]  HUT:[id number] DOB: [birth year] DOA: 06/01/2022 PCP: Celene Squibb, MD   Chief Complaint  Patient presents with   Shortness of Breath   Level of care: Telemetry  Brief Admission History:   76 y.o. female with medical history significant of hypertension and controlled asthma who presents to ED with a chief complaint of dyspnea.  Patient reportedly was quite short of breath that hesitation, only able to speak in 2-3 word phrases, and tripoding.  At the time of my exam patient is no longer tripoding, but still cannot complete a sentence without having to close her eyes and focus on breathing.  She has an audible wheeze from the doorway.  Due to her work of breathing, history is quite limited.  She reports that her dyspnea was gradual in onset and progressively worsening since last night.  She was not able to lay flat at all last night.  She has not noticed any swelling in her legs.  She has no history of CHF/fluid overload.  Her dyspnea was also worse on exertion.  It became acutely worse in the middle of the night last night.  She denies chest pain.  Her sister, at bedside, provides some extra history.  She reports that patient has recently had family members staying in her home from [place] places including [place], [place], [place].    Assessment and Plan: * Acute respiratory failure with hypoxia (HCC) -secondary to severe asthma exacerbation -Patient has no oxygen requirement at baseline -wean oxygen as able - consulted to [facility] pulmonologist Dr. Melvyn Novas for assistance with diagnosis and mgmt.  -BiPAP ordered if needed -Continue Singulair, steroids, bronchodilators -With the wheezing, and seems to be asthma exacerbation -Troponin elevation likely demand ischemia -PE has been ruled out.  V/Q scan with no evidence of PE.  2nd CTA chest no finding of PE.  -COVID negative -respiratory pathogen panel negative  -Dr. Halford Chessman changed her over to brovana, yupelri and  pulmicort and she is much improved  Elevated troponin - Troponin elevation likely demand ischemia in setting of respiratory distress (acute) -Continue to cycle troponins -EKG shows sinus tachycardia with a rate of 107, QTc 466, there is baseline artifact but appears to be some ST depression in the inferior leads -Denying chest pain at this time -Cardiology consulted and would like to get coronary CT when more clinically stable from respiratory standpoint.  -This is felt to be demand ischemia in the setting of acute hypoxic respiratory failure with possible PE -Repeat EKG for any new episodes of chest pain -Continue to monitor -completed 48 hours of IV heparin   Severe asthma with exacerbation - Wheezing, cough, severe SOB has been slowly getting better but not near baseline.   -COVID negative.  Resp panel negative.  -Only new exposure in the home with recent visitors from out of [place] country -Patient is not a smoker -Chest x-ray shows no active disease -There is a leukocytosis, patient does have a mostly nonproductive cough, but has been afebrile with no sick contacts-check a procalcitonin which was <0.10 and reassuring -Continue steroids, nebs, mucolytics, singulair, cough suppressants -Dr. Halford Chessman changed over to pulmicort, brovana and yupelri and she is much improved!  Hyperlipidemia - Continue Crestor  Leukocytosis - White blood cell count remains elevated but is coming down -leukemoid reaction from high dose steroids -No active disease on chest x-ray -COVID negative -Procalcitonin <0.10   Pulmonary Embolus RULED OUT - Presumed PE with possible subsegmental PE described on initial  CTA chest -radiologist recommended repeat study in 24 hours for confirmation but repeat CT did not show PE due to timing of contrast administration.   Ultrasound bilateral LE negative for DVT.  -Ultrasound DVT negative for clot in legs. -V/Q scan done on 7/8 with no evidence of PE also reassuring that  there is no PE -Echo reassuring : no heart strain noted -DC heparin/apixaban and full anticoagulation at this point as we have ruled out PE  Essential hypertension - Patient is on an ARB and HCTZ at baseline which is being held due to soft BP and bump in creatinine -Systolic blood pressure maintaining in the low 100s at this time, holding home medication -Continue to monitor; so far BPs have been stable  DVT prophylaxis: IV heparin  Code Status: Full  Family Communication: son at bedside Disposition: Status is: Inpatient Remains inpatient appropriate because: Intensity is deserving of inpatient status   Consultants:  PCCM Dr. Melvyn Novas , Halford Chessman Procedures:   Antimicrobials:    Subjective: Pt reports for the first time she is really starting to see really good improvement in her breathing with the new medication changes.    Objective: Vitals:   06/06/22 2026 06/06/22 2108 06/07/22 0434 06/07/22 0710  BP:  (!) 159/80 (!) 163/75   Pulse:  89 75   Resp:  18 16   Temp:  99.1 F (37.3 C) 98.6 F (37 C)   TempSrc:      SpO2: 94% 96% 96% 98%  Weight:      Height:        Intake/Output Summary (Last 24 hours) at 06/07/2022 1308 Last data filed at 06/07/2022 0500 Gross per 24 hour  Intake 480 ml  Output 1050 ml  Net -570 ml   Filed Weights   06/01/22 2200 06/03/22 0500 06/05/22 0500  Weight: 79.4 kg 78.9 kg 81.8 kg   Examination:  General exam: speaking more words without cough, less wheezing heard.  Respiratory system: less wheezing and better air movement today. Cardiovascular system: normal S1 & S2 heard. No JVD, murmurs, rubs, gallops or clicks. No pedal edema. Gastrointestinal system: Abdomen is nondistended, soft and nontender. No organomegaly or masses felt. Normal bowel sounds heard. Central nervous system: Alert and oriented. No focal neurological deficits. Extremities: Symmetric 5 x 5 power. Skin: No rashes, lesions or ulcers. Psychiatry: Judgement and insight appear  normal. Mood & affect appropriate.   Data Reviewed: I have personally reviewed following labs and imaging studies  CBC: Recent Labs  Lab 06/01/22 1514 06/02/22 0334 06/03/22 0411 06/04/22 0355 06/06/22 0538  WBC 19.0* 14.5* 25.8* 19.5* 18.1*  NEUTROABS 14.0*  --   --   --   --   HGB 13.9 12.6 12.6 11.9* 12.7  HCT 44.4 39.6 39.8 37.1 39.4  MCV 80.3 80.2 80.7 79.8* 79.8*  PLT 364 336 322 316 086    Basic Metabolic Panel: Recent Labs  Lab 06/01/22 1514 06/02/22 0334 06/03/22 0411 06/04/22 0355 06/06/22 0538  NA 138 136 135 136 136  K 3.7 3.1* 4.5 4.4 4.6  CL 101 104 105 103 106  CO2 '27 23 22 26 26  '$ GLUCOSE 167* 179* 163* 142* 122*  BUN 12 15 32* 34* 26*  CREATININE 0.81 0.90 1.13* 1.03* 1.03*  CALCIUM 9.5 9.3 9.5 9.0 8.6*  MG  --  2.2 2.4  --   --     CBG: No results for input(s): "GLUCAP" in the last 168 hours.  Recent Results (from the past 240  hour(s))  SARS Coronavirus 2 by RT PCR (hospital order, performed in [facility] hospital lab) *cepheid single result test* Anterior Nasal Swab     Status: None   Collection Time: 06/01/22  8:01 PM   Specimen: Anterior Nasal Swab  Result Value Ref Range Status   SARS Coronavirus 2 by RT PCR NEGATIVE NEGATIVE Final    Comment: (NOTE) SARS-CoV-2 target nucleic acids are NOT DETECTED.  The SARS-CoV-2 RNA is generally detectable in upper and lower respiratory specimens during the acute phase of infection. The lowest concentration of SARS-CoV-2 viral copies this assay can detect is 250 copies / mL. A negative result does not preclude SARS-CoV-2 infection and should not be used as the sole basis for treatment or other patient management decisions.  A negative result may occur with improper specimen collection / handling, submission of specimen other than nasopharyngeal swab, presence of viral mutation(s) within the areas targeted by this assay, and inadequate number of viral copies (<250 copies / mL). A negative result must  be combined with clinical observations, patient history, and epidemiological information.  Fact Sheet for Patients:   https://www.patel.info/  Fact Sheet for Healthcare Providers: https://hall.com/  This test is not yet approved or  cleared by the [place] FDA and has been authorized for detection and/or diagnosis of SARS-CoV-2 by FDA under an Emergency Use Authorization (EUA).  This EUA will remain in effect (meaning this test can be used) for the duration of the COVID-19 declaration under Section 564(b)(1) of the Act, 21 U.S.C. section 360bbb-3(b)(1), unless the authorization is terminated or revoked sooner.  Performed at [facility], [address] [place] [address], [place], [place] [address]   MRSA Next Gen by PCR, Nasal     Status: None   Collection Time: 06/01/22  9:35 PM   Specimen: Nasal Mucosa; Nasal Swab  Result Value Ref Range Status   MRSA by PCR Next Gen NOT DETECTED NOT DETECTED Final    Comment: (NOTE) The GeneXpert MRSA Assay (FDA approved for NASAL specimens only), is one component of a comprehensive MRSA colonization surveillance program. It is not intended to diagnose MRSA infection nor to guide or monitor treatment for MRSA infections. Test performance is not FDA approved in patients less than 57 years old. Performed at [facility], [address], [place], [place] [address]   Respiratory (~20 pathogens) panel by PCR     Status: None   Collection Time: 06/02/22  8:33 AM   Specimen: Nasopharyngeal Swab; Respiratory  Result Value Ref Range Status   Adenovirus NOT DETECTED NOT DETECTED Final   Coronavirus 229E NOT DETECTED NOT DETECTED Final    Comment: (NOTE) The Coronavirus on the Respiratory Panel, DOES NOT test for the novel  Coronavirus (2019 nCoV)    Coronavirus HKU1 NOT DETECTED NOT DETECTED Final   Coronavirus NL63 NOT DETECTED NOT DETECTED Final   Coronavirus OC43 NOT DETECTED NOT DETECTED Final   Metapneumovirus NOT  DETECTED NOT DETECTED Final   Rhinovirus / Enterovirus NOT DETECTED NOT DETECTED Final   Influenza A NOT DETECTED NOT DETECTED Final   Influenza B NOT DETECTED NOT DETECTED Final   Parainfluenza Virus 1 NOT DETECTED NOT DETECTED Final   Parainfluenza Virus 2 NOT DETECTED NOT DETECTED Final   Parainfluenza Virus 3 NOT DETECTED NOT DETECTED Final   Parainfluenza Virus 4 NOT DETECTED NOT DETECTED Final   Respiratory Syncytial Virus NOT DETECTED NOT DETECTED Final   Bordetella pertussis NOT DETECTED NOT DETECTED Final   Bordetella Parapertussis NOT DETECTED NOT  DETECTED Final   Chlamydophila pneumoniae NOT DETECTED NOT DETECTED Final   Mycoplasma pneumoniae NOT DETECTED NOT DETECTED Final    Comment: Performed at [facility], [place] [address] [place] [address] [place], [place] [address]     Radiology Studies: No results found.  Scheduled Meds:  arformoterol  15 mcg Nebulization BID   aspirin EC  81 mg Oral Daily   azithromycin  250 mg Oral Daily   budesonide (PULMICORT) nebulizer solution  0.5 mg Nebulization BID   Chlorhexidine Gluconate Cloth  6 each Topical Q0600   guaiFENesin  1,200 mg Oral BID   heparin injection (subcutaneous)  5,000 Units Subcutaneous Q8H   loratadine  10 mg Oral Daily   melatonin  6 mg Oral QHS   methylPREDNISolone (SOLU-MEDROL) injection  60 mg Intravenous Q12H   montelukast  10 mg Oral QHS   pantoprazole  40 mg Oral Daily   pneumococcal 20-valent conjugate vaccine  0.5 mL Intramuscular Tomorrow-1000   polyethylene glycol  17 g Oral Daily   revefenacin  175 mcg Nebulization Daily   rosuvastatin  5 mg Oral Once per day on Sun Thu   sodium chloride  2 spray Each Nare BID   Continuous Infusions:   LOS: 6 days   Irwin Brakeman, MD How to contact the [facility] Attending or Consulting provider [place] or covering provider during after hours [place], for this patient?  Check the care team in [facility] and look for a) attending/consulting [facility] provider listed and b) the [facility] team  listed Log into [email or web address] and use [place]'s universal password to access. If you do not have the password, please contact the hospital operator. Locate the [facility] provider you are looking for under [facility] and page to a number that you can be directly reached. If you still have difficulty reaching the provider, please page the [facility] (Director on Call) for the Hospitalists listed on amion for assistance.  06/07/2022, 1:08 PM

## 2022-06-08 ENCOUNTER — Telehealth: Payer: Self-pay

## 2022-06-08 DIAGNOSIS — J9601 Acute respiratory failure with hypoxia: Secondary | ICD-10-CM | POA: Diagnosis not present

## 2022-06-08 DIAGNOSIS — J4551 Severe persistent asthma with (acute) exacerbation: Secondary | ICD-10-CM | POA: Diagnosis not present

## 2022-06-08 LAB — CBC
HCT: 35 % — ABNORMAL LOW (ref 36.0–46.0)
Hemoglobin: 11.6 g/dL — ABNORMAL LOW (ref 12.0–15.0)
MCH: 26 pg (ref 26.0–34.0)
MCHC: 33.1 g/dL (ref 30.0–36.0)
MCV: 78.3 fL — ABNORMAL LOW (ref 80.0–100.0)
Platelets: 343 10*3/uL (ref 150–400)
RBC: 4.47 MIL/uL (ref 3.87–5.11)
RDW: 15 % (ref 11.5–15.5)
WBC: 25.8 10*3/uL — ABNORMAL HIGH (ref 4.0–10.5)
nRBC: 0.2 % (ref 0.0–0.2)

## 2022-06-08 LAB — BASIC METABOLIC PANEL
Anion gap: 12 (ref 5–15)
BUN: 29 mg/dL — ABNORMAL HIGH (ref 8–23)
CO2: 25 mmol/L (ref 22–32)
Calcium: 8.5 mg/dL — ABNORMAL LOW (ref 8.9–10.3)
Chloride: 98 mmol/L (ref 98–111)
Creatinine, Ser: 1.05 mg/dL — ABNORMAL HIGH (ref 0.44–1.00)
GFR, Estimated: 54 mL/min — ABNORMAL LOW (ref >60)
Glucose, Bld: 122 mg/dL — ABNORMAL HIGH (ref 70–99)
Potassium: 3.6 mmol/L (ref 3.5–5.1)
Sodium: 135 mmol/L (ref 135–145)

## 2022-06-08 MED ORDER — PREDNISONE 20 MG PO TABS
30.0000 mg | ORAL_TABLET | Freq: Every day | ORAL | Status: DC
  Administered 2022-06-08 – 2022-06-09 (×2): 30 mg via ORAL
  Filled 2022-06-08 (×2): qty 1

## 2022-06-08 NOTE — Telephone Encounter (Signed)
Patient scheduled for 07/12/22 with Dr. Melvyn Novas next available. Appt reminder mailed. Nothing further needed.

## 2022-06-08 NOTE — Telephone Encounter (Signed)
-----   Message from Chesley Mires, MD sent at 06/08/2022  2:12 PM EDT ----- Tammy Brazen,  Ms. Tapscott is in [facility], but should be getting discharged later this week.  She needs HFU with Dr. Melvyn Novas in next couple of weeks.  Thanks.  V

## 2022-06-08 NOTE — Progress Notes (Signed)
Physical Therapy Treatment Patient Details Name: Tammy [name] MRN: [id number] DOB: Jan 23, 1946 Today's Date: 06/08/2022   History of Present Illness 76 y.o. female with medical history significant of hypertension and controlled asthma who presents to ED with a chief complaint of dyspnea.  Patient reportedly was quite short of breath that hesitation, only able to speak in 2-3 word phrases, and tripoding.  At the time of my exam patient is no longer tripoding, but still cannot complete a sentence without having to close her eyes and focus on breathing.  She has an audible wheeze from the doorway.  Due to her work of breathing, history is quite limited.  She reports that her dyspnea was gradual in onset and progressively worsening since last night.  She was not able to lay flat at all last night.  She has not noticed any swelling in her legs.  She has no history of CHF/fluid overload.  Her dyspnea was also worse on exertion.  It became acutely worse in the middle of the night last night.  She denies chest pain.  Her sister, at bedside, provides some extra history.  She reports that patient has recently had family members staying in her home from [place] places including [place], [place], [place].    PT Comments    Patient presents supine in bed with family members present. Patient consents to PT treatment session. Patient is modified independent with bed mobility requiring extended time with HOB elevated. Good trunk control demonstrated with ability to scoot to EOB. Patient was able to tolerate therapeutic exercises focusing on strengthening B LE. Patient is independent with transfers without AD. Minor balance deficits observed but patient able to ambulate to the bathroom and transfer from commode indicating good return for home and PLOF. Good coordination with slow cadence. Patient was able to ambulate 250 feet in hallway with supervision and no AD. Demonstrates decreased gait speed with minor gait  deviations but able to maintain appropriate gait pattern. Able to ambulate incline/decline surfaces but does experience decrease in balancing ability requiring patient to hold onto hand rail for safety. Primarily limited by fatigue. Patient left at bed side in room with family members present. Nursing staff notified of mobility status. Patient will benefit from continued skilled physical therapy in hospital and recommended venue below to increase strength, balance, endurance for safe ADLs and gait.    Recommendations for follow up therapy are one component of a multi-disciplinary discharge planning process, led by the attending physician.  Recommendations may be updated based on patient status, additional functional criteria and insurance authorization.  Follow Up Recommendations  No PT follow up     Assistance Recommended at Discharge PRN  Patient can return home with the following A little help with walking and/or transfers;A little help with bathing/dressing/bathroom;Assistance with cooking/housework;Assist for transportation;Help with stairs or ramp for entrance   Equipment Recommendations  Cane    Recommendations for Other Services       Precautions / Restrictions Precautions Precautions: Fall Restrictions Weight Bearing Restrictions: No     Mobility  Bed Mobility Overal bed mobility: Modified Independent             General bed mobility comments: Modified independent with bed mobility requiring extended time with HOB elevated. Good trunk control demonstrated.    Transfers Overall transfer level: Independent Equipment used: None Transfers: Sit to/from Stand, Bed to chair/wheelchair/BSC Sit to Stand: Independent           General transfer comment: Able to transfer independently  with good techincal form. Minor balance deviations but able to ambulate to bathroom and transfer from commode indicating good return for home. Good coordination with slow cadence.     Ambulation/Gait Ambulation/Gait assistance: Supervision Gait Distance (Feet): 250 Feet Assistive device: None Gait Pattern/deviations: Step-through pattern, Decreased step length - right, Decreased step length - left, Decreased stride length, Narrow base of support Gait velocity: decreased     General Gait Details: Able to ambulate 250 feet in hallway with supervision and no AD. Decreased gait speed with minor gait deviations but able to maintain gait pattern. Able to ambualte incline/decline surfaces but experiences loss of balance with need to hold onto railing for stability. Limited by fatigue.   Stairs             Wheelchair Mobility    Modified Rankin (Stroke Patients Only)       Balance Overall balance assessment: Mild deficits observed, not formally tested                                          Cognition Arousal/Alertness: Awake/alert Behavior During Therapy: WFL for tasks assessed/performed Overall Cognitive Status: Within Functional Limits for tasks assessed                                          Exercises General Exercises - Lower Extremity Long Arc Quad: AROM, 20 reps, Strengthening, Seated, Both Hip Flexion/Marching: AROM, 20 reps, Strengthening, Seated, Both Toe Raises: AROM, 20 reps, Strengthening, Seated, Both Heel Raises: AROM, 20 reps, Strengthening, Seated, Both    General Comments        Pertinent Vitals/Pain Pain Assessment Pain Assessment: No/denies pain    Home Living                          Prior Function            PT Goals (current goals can now be found in the care plan section) Acute Rehab PT Goals Patient Stated Goal: Go home PT Goal Formulation: With patient/family Time For Goal Achievement: 06/20/22 Potential to Achieve Goals: Good Progress towards PT goals: Progressing toward goals    Frequency    Min 3X/week      PT Plan Current plan remains appropriate     Co-evaluation              AM-PAC PT "6 Clicks" Mobility   Outcome Measure  Help needed turning from your back to your side while in a flat bed without using bedrails?: None Help needed moving from lying on your back to sitting on the side of a flat bed without using bedrails?: None Help needed moving to and from a bed to a chair (including a wheelchair)?: A Little Help needed standing up from a chair using your arms (e.g., wheelchair or bedside chair)?: A Little Help needed to walk in hospital room?: A Little Help needed climbing 3-5 steps with a railing? : A Little 6 Click Score: 20    End of Session   Activity Tolerance: Patient tolerated treatment well;Patient limited by fatigue Patient left: in chair;with call bell/phone within reach;with family/visitor present Nurse Communication: Mobility status PT Visit Diagnosis: Unsteadiness on feet (R26.81);Other abnormalities of gait and mobility (R26.89)  Time: 1425-1450 PT Time Calculation (min) (ACUTE ONLY): 25 min  Charges:  $Gait Training: 8-22 mins $Therapeutic Exercise: 8-22 mins                     3:06 PM, 06/08/22 Lestine Box, S/PT

## 2022-06-08 NOTE — Progress Notes (Signed)
[facility]   Patient name: Tammy [name] Admit date: 06/01/2022  DOB: [birth year] LOS: 7  MRN: [id number] Consult date: 06/02/2022  Referring provider: Dr. Wynetta Emery, [facility]: Dyspnea    History:  76 yo female former smoker with hx of asthma presented to [facility] with 4 days of worsening dyspnea with chest discomfort.  Noted to have wheezing.  She was found to have elevated troponin.  PCCM consulted to assist with respiratory management.  Past medical history:  HTN, HLD  Significant events:  7/05 Admit to SDU, start solumedrol and heparin gtt 7/06 Cardiology consulted for elevated troponin 7/08 heparin gtt stopped 7/10 cardiology sign off 7/12 change to prednisone  Studies:  CT angio chest 06/01/22 >> possible filling defect in subsegmental artery LLL Echo 06/02/22 >> EF 70 to 75%, mod LVH, grade 1 DD, RVSP 40.3 mmHg Doppler legs b/l 06/02/22 >> no DVT CT angio chest 06/02/22 >> diffuse bronchial wall thickening, no PE V/Q scan 06/04/22 >> no PE  Micro:  COVID 7/05 >> negative RVP 7/06 >> negative  Lines:     Antibiotics:  Zithromax 7/10 >>   Consults:  Cardiology    Interim history:  Not coughing as much.  Nasal saline spray made her throat burn.  Hasn't gotten out of bed much for past couple of days.    Vital signs:  BP 133/75 (BP Location: Right Arm)   Pulse 75   Temp 97.8 F (36.6 C)   Resp 18   Ht '5\' 5"'$  (1.651 m)   Wt 81.8 kg   SpO2 97%   BMI 30.01 kg/m   Intake/output:  I/O last 3 completed shifts: In: 540 [P.O.:540] Out: 1825 [Urine:1825]   Physical exam:   General - alert Eyes - pupils reactive ENT - no sinus tenderness, no stridor Cardiac - regular rate/rhythm, no murmur Chest - equal breath sounds b/l, no wheezing or rales Abdomen - soft, non tender, + bowel sounds Extremities - no cyanosis, clubbing, or edema Skin - no rashes Neuro - normal strength, moves extremities, follows  commands Psych - normal mood and behavior      Best practice:   DVT - SQ heparin SUP - Protonix Nutrition - Heart healthy   Assessment/plan:   Acute asthma exacerbation. - Abx day 3 of 5, currently on zithromax - change to prednisone 30 mg daily and wean off as tolerated - continue yupelri, brovana, pulmicort >> transition back to trelegy when she is discharged - continue singulair, mucinex, flutter valve - prn albuterol - would need to see how she does with mobility prior to discharge  Allergic rhinitis. - continue flonase, singulair, claritin - nasal irrigation caused throat irritation >> will d/c  Cough. - prn tessalon and hycodan  Elevated troponin likely from demand ischemia. Hx of HTN, HLD. - per primary team - cardiology signed off  Resolved hospital problems:    Goals of care/Family discussions:  Code status: full  Updated family at bedside  Labs:      Latest Ref Rng & Units 06/08/2022    5:59 AM 06/06/2022    5:38 AM 06/04/2022    3:55 AM  CMP  Glucose 70 - 99 mg/dL 122  122  142   BUN 8 - 23 mg/dL 29  26  34   Creatinine 0.44 - 1.00 mg/dL 1.05  1.03  1.03   Sodium 135 - 145 mmol/L 135  136  136   Potassium 3.5 - 5.1 mmol/L 3.6  4.6  4.4   Chloride 98 - 111 mmol/L 98  106  103   CO2 22 - 32 mmol/L '25  26  26   '$ Calcium 8.9 - 10.3 mg/dL 8.5  8.6  9.0        Latest Ref Rng & Units 06/08/2022    5:59 AM 06/06/2022    5:38 AM 06/04/2022    3:55 AM  CBC  WBC 4.0 - 10.5 K/uL 25.8  18.1  19.5   Hemoglobin 12.0 - 15.0 g/dL 11.6  12.7  11.9   Hematocrit 36.0 - 46.0 % 35.0  39.4  37.1   Platelets 150 - 400 K/uL 343  307  316     ABG    Component Value Date/Time   HCO3 24.2 06/01/2022 2012   ACIDBASEDEF 0.7 06/01/2022 2012   O2SAT 86.1 06/01/2022 2012     Signature:  Chesley Mires, MD [place] Pager - [phone] 06/08/2022, 2:04 PM

## 2022-06-08 NOTE — Progress Notes (Signed)
PROGRESS NOTE   ANNALEAH ARATA  SWF:[id number] DOB: 07/30/1946 DOA: 06/01/2022 PCP: Celene Squibb, MD   Chief Complaint  Patient presents with   Shortness of Breath   Level of care: Telemetry  Brief Admission History:   76 y.o. female with medical history significant of hypertension and controlled asthma who presents to ED with a chief complaint of dyspnea.  Patient reportedly was quite short of breath that hesitation, only able to speak in 2-3 word phrases, and tripoding.  At the time of my exam patient is no longer tripoding, but still cannot complete a sentence without having to close her eyes and focus on breathing.  She has an audible wheeze from the doorway.  Due to her work of breathing, history is quite limited.  She reports that her dyspnea was gradual in onset and progressively worsening since last night.  She was not able to lay flat at all last night.  She has not noticed any swelling in her legs.  She has no history of CHF/fluid overload.  Her dyspnea was also worse on exertion.  It became acutely worse in the middle of the night last night.  She denies chest pain.  Her sister, at bedside, provides some extra history.  She reports that patient has recently had family members staying in her home from [place] places including [place], [place], [place].    Assessment and Plan: * Acute respiratory failure with hypoxia (Protivin) -Patient is a reformed smoker VQ scan and CT chest suggest possible emphysema/COPD component given smoking history this is likely -Patient also had significant allergic rhinitis/reactive airway disease -Patient reports no prior official diagnosis of asthma in the past COPD Vs Asthma Vs reactive airway disease in a patient with significant allergic rhinitis -Patient will benefit from outpatient PFT/spirometry once acute symptoms resolve to better delineate exact diagnosis -Patient has no oxygen requirement at baseline Pulmonary consult from Dr. Halford Chessman appreciated -PE  work-up essentially negative -Neurology input appreciated -Continue steroids bronchodilators and mucolytics -Weaning off oxygen -COVID-negative  Elevated troponin - Troponin elevation likely demand ischemia in setting of respiratory distress (acute) --Cardiology consult appreciated no ACS concerns at this time -outpatient CT coronary advised    Hyperlipidemia - Continue Crestor  Leukocytosis - White blood cell count remains elevated but is coming down -leukemoid reaction from high dose steroids -No active disease on chest x-ray -COVID negative -Procalcitonin <0.10   Pulmonary Embolus RULED OUT - Presumed PE with possible subsegmental PE described on initial CTA chest -radiologist recommended repeat study in 24 hours for confirmation but repeat CT did not show PE due to timing of contrast administration.   Ultrasound bilateral LE negative for DVT.  -Ultrasound DVT negative for clot in legs. -V/Q scan done on 7/8 with no evidence of PE also reassuring that there is no PE -Echo reassuring : no heart strain noted -DC heparin/apixaban and full anticoagulation at this point as we have ruled out PE  Essential hypertension - Patient is on an ARB and HCTZ at baseline which is being held due to soft BP and bump in creatinine -Systolic blood pressure maintaining in the low 100s at this time, holding home medication -Continue to monitor; so far BPs have been stable  DVT prophylaxis: -SCDs  code Status: Full  Family Communication: son at bedside Disposition: Status is: Inpatient Remains inpatient appropriate because: Intensity is deserving of inpatient status   Consultants:  PCCM Dr. Melvyn Novas , Halford Chessman Procedures:   Antimicrobials:    Subjective: -Patient's son Ronalee Belts,  patient's sister and patient's daughter-in-law at bedside, questions answered -Wheezing cough on shortness of breath improving -Weaning off oxygen nicely -Told by Dr. Halford Chessman pulmonologist that she may be able to go home on  06/09/2022  Objective: Vitals:   06/07/22 2130 06/08/22 0521 06/08/22 0707 06/08/22 1619  BP:  133/75  119/70  Pulse:  75  90  Resp:  18  18  Temp:  97.8 F (36.6 C)  98.1 F (36.7 C)  TempSrc:      SpO2: 92% 95% 97% 97%  Weight:      Height:        Intake/Output Summary (Last 24 hours) at 06/08/2022 1941 Last data filed at 06/08/2022 1230 Gross per 24 hour  Intake 540 ml  Output 400 ml  Net 140 ml   Filed Weights   06/01/22 2200 06/03/22 0500 06/05/22 0500  Weight: 79.4 kg 78.9 kg 81.8 kg   Examination:  General exam: speaking more words without cough, less wheezing heard.  Respiratory system: less wheezing and better air movement today. Cardiovascular system: normal S1 & S2 heard. No JVD, murmurs, rubs, gallops or clicks. No pedal edema. Gastrointestinal system: Abdomen is nondistended, soft and nontender. No organomegaly or masses felt. Normal bowel sounds heard. Central nervous system: Alert and oriented. No focal neurological deficits. Extremities: Symmetric 5 x 5 power. Skin: No rashes, lesions or ulcers. Psychiatry: Judgement and insight appear normal. Mood & affect appropriate.   Data Reviewed: I have personally reviewed following labs and imaging studies  CBC: Recent Labs  Lab 06/02/22 0334 06/03/22 0411 06/04/22 0355 06/06/22 0538 06/08/22 0559  WBC 14.5* 25.8* 19.5* 18.1* 25.8*  HGB 12.6 12.6 11.9* 12.7 11.6*  HCT 39.6 39.8 37.1 39.4 35.0*  MCV 80.2 80.7 79.8* 79.8* 78.3*  PLT 336 322 316 307 096    Basic Metabolic Panel: Recent Labs  Lab 06/02/22 0334 06/03/22 0411 06/04/22 0355 06/06/22 0538 06/08/22 0559  NA 136 135 136 136 135  K 3.1* 4.5 4.4 4.6 3.6  CL 104 105 103 106 98  CO2 '23 22 26 26 25  '$ GLUCOSE 179* 163* 142* 122* 122*  BUN 15 32* 34* 26* 29*  CREATININE 0.90 1.13* 1.03* 1.03* 1.05*  CALCIUM 9.3 9.5 9.0 8.6* 8.5*  MG 2.2 2.4  --   --   --     CBG: No results for input(s): "GLUCAP" in the last 168 hours.  Recent Results  (from the past 240 hour(s))  SARS Coronavirus 2 by RT PCR (hospital order, performed in [facility] hospital lab) *cepheid single result test* Anterior Nasal Swab     Status: None   Collection Time: 06/01/22  8:01 PM   Specimen: Anterior Nasal Swab  Result Value Ref Range Status   SARS Coronavirus 2 by RT PCR NEGATIVE NEGATIVE Final    Comment: (NOTE) SARS-CoV-2 target nucleic acids are NOT DETECTED.  The SARS-CoV-2 RNA is generally detectable in upper and lower respiratory specimens during the acute phase of infection. The lowest concentration of SARS-CoV-2 viral copies this assay can detect is 250 copies / mL. A negative result does not preclude SARS-CoV-2 infection and should not be used as the sole basis for treatment or other patient management decisions.  A negative result may occur with improper specimen collection / handling, submission of specimen other than nasopharyngeal swab, presence of viral mutation(s) within the areas targeted by this assay, and inadequate number of viral copies (<250 copies / mL). A negative result must be combined with clinical  observations, patient history, and epidemiological information.  Fact Sheet for Patients:   https://www.patel.info/  Fact Sheet for Healthcare Providers: https://hall.com/  This test is not yet approved or  cleared by the [place] FDA and has been authorized for detection and/or diagnosis of SARS-CoV-2 by FDA under an Emergency Use Authorization (EUA).  This EUA will remain in effect (meaning this test can be used) for the duration of the COVID-19 declaration under Section 564(b)(1) of the Act, 21 U.S.C. section 360bbb-3(b)(1), unless the authorization is terminated or revoked sooner.  Performed at [facility], [address] [place], [place] [address]   MRSA Next Gen by PCR, Nasal     Status: None   Collection Time: 06/01/22  9:35 PM   Specimen: Nasal Mucosa; Nasal Swab   Result Value Ref Range Status   MRSA by PCR Next Gen NOT DETECTED NOT DETECTED Final    Comment: (NOTE) The GeneXpert MRSA Assay (FDA approved for NASAL specimens only), is one component of a comprehensive MRSA colonization surveillance program. It is not intended to diagnose MRSA infection nor to guide or monitor treatment for MRSA infections. Test performance is not FDA approved in patients less than 43 years old. Performed at [facility], [address], [place], [place] [address]   Respiratory (~20 pathogens) panel by PCR     Status: None   Collection Time: 06/02/22  8:33 AM   Specimen: Nasopharyngeal Swab; Respiratory  Result Value Ref Range Status   Adenovirus NOT DETECTED NOT DETECTED Final   Coronavirus 229E NOT DETECTED NOT DETECTED Final    Comment: (NOTE) The Coronavirus on the Respiratory Panel, DOES NOT test for the novel  Coronavirus (2019 nCoV)    Coronavirus HKU1 NOT DETECTED NOT DETECTED Final   Coronavirus NL63 NOT DETECTED NOT DETECTED Final   Coronavirus OC43 NOT DETECTED NOT DETECTED Final   Metapneumovirus NOT DETECTED NOT DETECTED Final   Rhinovirus / Enterovirus NOT DETECTED NOT DETECTED Final   Influenza A NOT DETECTED NOT DETECTED Final   Influenza B NOT DETECTED NOT DETECTED Final   Parainfluenza Virus 1 NOT DETECTED NOT DETECTED Final   Parainfluenza Virus 2 NOT DETECTED NOT DETECTED Final   Parainfluenza Virus 3 NOT DETECTED NOT DETECTED Final   Parainfluenza Virus 4 NOT DETECTED NOT DETECTED Final   Respiratory Syncytial Virus NOT DETECTED NOT DETECTED Final   Bordetella pertussis NOT DETECTED NOT DETECTED Final   Bordetella Parapertussis NOT DETECTED NOT DETECTED Final   Chlamydophila pneumoniae NOT DETECTED NOT DETECTED Final   Mycoplasma pneumoniae NOT DETECTED NOT DETECTED Final    Comment: Performed at [facility], [place] [address] [place], [place] [address]     Radiology Studies: No results found.  Scheduled Meds:  arformoterol   15 mcg Nebulization BID   aspirin EC  81 mg Oral Daily   azithromycin  250 mg Oral Daily   budesonide (PULMICORT) nebulizer solution  0.5 mg Nebulization BID   guaiFENesin  1,200 mg Oral BID   heparin injection (subcutaneous)  5,000 Units Subcutaneous Q8H   hydrochlorothiazide  25 mg Oral Daily   loratadine  10 mg Oral Daily   losartan  100 mg Oral Daily   melatonin  6 mg Oral QHS   montelukast  10 mg Oral QHS   pantoprazole  40 mg Oral Daily   pneumococcal 20-valent conjugate vaccine  0.5 mL Intramuscular Tomorrow-1000   polyethylene glycol  17 g Oral Daily   predniSONE  30 mg Oral Q breakfast  revefenacin  175 mcg Nebulization Daily   rosuvastatin  5 mg Oral Once per day on Sun Thu   saccharomyces boulardii  250 mg Oral BID   Continuous Infusions:   LOS: 7 days   Roxan Hockey, MD How to contact the [facility] Attending or Consulting provider [place] or covering provider during after hours [place], for this patient?  Check the care team in [facility] and look for a) attending/consulting [facility] provider listed and b) the [facility] team listed Log into [email or web address] and use [place]'s universal password to access. If you do not have the password, please contact the hospital operator. Locate the [facility] provider you are looking for under [facility] and page to a number that you can be directly reached. If you still have difficulty reaching the provider, please page the [facility] (Director on Call) for the Hospitalists listed on amion for assistance.  06/08/2022, 7:41 PM

## 2022-06-09 DIAGNOSIS — J4551 Severe persistent asthma with (acute) exacerbation: Secondary | ICD-10-CM | POA: Diagnosis not present

## 2022-06-09 MED ORDER — TRELEGY ELLIPTA 100-62.5-25 MCG/ACT IN AEPB
1.0000 | INHALATION_SPRAY | Freq: Every day | RESPIRATORY_TRACT | 3 refills | Status: DC
Start: 2022-06-09 — End: 2022-06-09

## 2022-06-09 MED ORDER — LOSARTAN POTASSIUM-HCTZ 100-25 MG PO TABS: 0.5000 | ORAL_TABLET | Freq: Every day | ORAL | 3 refills | Status: DC

## 2022-06-09 MED ORDER — OMEPRAZOLE MAGNESIUM 20 MG PO TBEC: 20.0000 mg | DELAYED_RELEASE_TABLET | Freq: Every day | ORAL | 1 refills | Status: DC

## 2022-06-09 MED ORDER — FLUTICASONE PROPIONATE 50 MCG/ACT NA SUSP: 2.0000 | Freq: Every day | NASAL | 2 refills | Status: DC

## 2022-06-09 MED ORDER — GUAIFENESIN ER 600 MG PO TB12: 600.0000 mg | ORAL_TABLET | Freq: Two times a day (BID) | ORAL | 2 refills | Status: DC

## 2022-06-09 MED ORDER — PREDNISONE 20 MG PO TABS: 20.0000 mg | ORAL_TABLET | ORAL | 0 refills | Status: AC

## 2022-06-09 MED ORDER — ALBUTEROL SULFATE HFA 108 (90 BASE) MCG/ACT IN AERS
2.0000 | INHALATION_SPRAY | RESPIRATORY_TRACT | 2 refills | Status: DC | PRN
Start: 2022-06-09 — End: 2022-06-09

## 2022-06-09 MED ORDER — ALBUTEROL SULFATE (2.5 MG/3ML) 0.083% IN NEBU: 2.5000 mg | INHALATION_SOLUTION | RESPIRATORY_TRACT | 2 refills | Status: DC | PRN

## 2022-06-09 MED ORDER — MONTELUKAST SODIUM 10 MG PO TABS: 10.0000 mg | ORAL_TABLET | Freq: Every day | ORAL | 2 refills | Status: DC

## 2022-06-09 MED ORDER — ALBUTEROL SULFATE HFA 108 (90 BASE) MCG/ACT IN AERS: 2.0000 | INHALATION_SPRAY | RESPIRATORY_TRACT | 2 refills | Status: DC | PRN

## 2022-06-09 MED ORDER — TRELEGY ELLIPTA 100-62.5-25 MCG/ACT IN AEPB: 1.0000 | INHALATION_SPRAY | Freq: Every day | RESPIRATORY_TRACT | 3 refills | Status: DC

## 2022-06-09 MED ORDER — ALBUTEROL SULFATE (2.5 MG/3ML) 0.083% IN NEBU
2.5000 mg | INHALATION_SOLUTION | RESPIRATORY_TRACT | 2 refills | Status: DC | PRN
Start: 2022-06-09 — End: 2022-06-09

## 2022-06-09 MED ORDER — PREDNISONE 20 MG PO TABS: 20.0000 mg | ORAL_TABLET | ORAL | 0 refills | Status: DC

## 2022-06-09 NOTE — Discharge Summary (Signed)
Tammy [name], is a 76 y.o. female  DOB [birth year]  MRN [id number].  Admission date:  06/01/2022  Admitting Physician  Rolla Plate, DO  Discharge Date:  06/09/2022   Primary MD  Celene Squibb, MD  Recommendations for primary care physician for things to follow:   1)Take Prednisone 20 mg tablets---Take 2 tablets daily with breakfast for 4 days then 1 tablet daily with breakfast for 4 days, then stop  2) Follow - up with Dr. Melvyn Novas Pulmonologist in [place]- on 07/12/22--- you may benefit from a pulmonary function test to determine if you have Asthma or COPD -Address: [address] [place], [place] [address] Phone: [phone] OR call [phone] -Patient needs to be seen in the [place] office, Not in [place]  3) please note that your blood pressure medication has been adjusted  Admission Diagnosis  Elevated troponin [R77.8] Acute respiratory failure with hypoxia ([place]) [J96.01] Severe asthma with exacerbation, unspecified whether persistent [J45.901] Single subsegmental pulmonary embolism without acute cor pulmonale (HCC) [I26.93]   Discharge Diagnosis  Elevated troponin [R77.8] Acute respiratory failure with hypoxia ([place]) [J96.01] Severe asthma with exacerbation, unspecified whether persistent [J45.901] Single subsegmental pulmonary embolism without acute cor pulmonale (HCC) [I26.93]    Principal Problem:   Acute respiratory failure with hypoxia (HCC) Active Problems:   Essential hypertension   Pulmonary Embolus RULED OUT   Leukocytosis   Hyperlipidemia   Severe asthma with exacerbation   Elevated troponin      Past Medical History:  Diagnosis Date   Asthma    Hypertension     Past Surgical History:  Procedure Laterality Date   ABDOMINAL HYSTERECTOMY  1988   CATARACT EXTRACTION W/PHACO Right 09/27/2019   Procedure: CATARACT EXTRACTION PHACO AND INTRAOCULAR LENS PLACEMENT  (IOC) (CDE: 4.94);  Surgeon: Baruch Goldmann, MD;  Location: [facility];  Service: Ophthalmology;  Laterality: Right;   CATARACT EXTRACTION W/PHACO Left 10/18/2019   Procedure: CATARACT EXTRACTION PHACO AND INTRAOCULAR LENS PLACEMENT (IOC);  Surgeon: Baruch Goldmann, MD;  Location: [facility];  Service: Ophthalmology;  Laterality: Left;  CDE: 7.18   COLONOSCOPY N/A 08/11/2016   Procedure: COLONOSCOPY;  Surgeon: Rogene Houston, MD;  Location: [facility];  Service: Endoscopy;  Laterality: N/A;  730   ELBOW FRACTURE SURGERY     KNEE ARTHROSCOPY     SHOULDER OPEN ROTATOR CUFF REPAIR Right 10/06/2016   Procedure: OPEN ROTATOR CUFF REPAIR RIGHT SHOULDER;  Surgeon: Carole Civil, MD;  Location: [facility];  Service: Orthopedics;  Laterality: Right;       HPI  from the history and physical done on the day of admission:   HPI: Tammy [name] is a 76 y.o. female with medical history significant of hypertension and controlled asthma who presents to ED with a chief complaint of dyspnea.  Patient reportedly was quite short of breath that hesitation, only able to speak in 2-3 word phrases, and tripoding.  At the time of my exam patient is no longer tripoding, but still cannot complete a sentence  without having to close her eyes and focus on breathing.  She has an audible wheeze from the doorway.  Due to her work of breathing, history is quite limited.  She reports that her dyspnea was gradual in onset and progressively worsening since last night.  She was not able to lay flat at all last night.  She has not noticed any swelling in her legs.  She has no history of CHF/fluid overload.  Her dyspnea was also worse on exertion.  It became acutely worse in the middle of the night last night.  She denies chest pain.  Her sister, at bedside, provides some extra history.  She reports that patient has recently had family members staying in her home from [place] places including [place], [place], [place].  They are the only new  exposures in her home.  Patient has no pets, no new laundry detergent, perfumes, etc.  Patient has had no recent history of travel/long car, plane, train rides.  She has had no hemoptysis.  She has not been hospitalized for asthma in the past.  She does not wear oxygen at home.  Patient denies any sick contacts.  She was vaccinated for COVID.  She has been afebrile.  We clarified, patient is full code including intubation.  Her son would be her decision-maker.  He will be at bedside tomorrow.  Further questioning deferred until patient's breathing is under control.     Review of Systems: unable to review all systems due to the inability of the patient to answer      Hospital Course:      76 y.o. female with medical history significant of hypertension and controlled asthma who presents to ED with a chief complaint of dyspnea.  Patient reportedly was quite short of breath that hesitation, only able to speak in 2-3 word phrases, and tripoding.  At the time of my exam patient is no longer tripoding, but still cannot complete a sentence without having to close her eyes and focus on breathing.  She has an audible wheeze from the doorway.  Due to her work of breathing, history is quite limited.  She reports that her dyspnea was gradual in onset and progressively worsening since last night.  She was not able to lay flat at all last night.  She has not noticed any swelling in her legs.  She has no history of CHF/fluid overload.  Her dyspnea was also worse on exertion.  It became acutely worse in the middle of the night last night.  She denies chest pain.  Her sister, at bedside, provides some extra history.  She reports that patient has recently had family members staying in her home from [place] places including [place], [place], [place].   Assessment and Plan: Acute respiratory failure with hypoxia ([place]) -Patient is a reformed smoker -- VQ scan and CT chest suggest possible emphysema/COPD component given  smoking history this is likely -Patient also had significant allergic rhinitis/reactive airway disease -Patient reports no prior official diagnosis of asthma in the past COPD Vs Asthma Vs reactive airway disease in a patient with significant allergic rhinitis -Patient will benefit from outpatient PFT/spirometry once acute symptoms resolve to better delineate exact diagnosis -Patient has no oxygen requirement at baseline Pulmonary consult from Dr. Halford Chessman appreciated -PE work-up essentially negative -Cardiology input appreciated -COVID-negative -Much improved respiratory status after steroids, -Hypoxia resolved, Patient has been weaned off oxygen -   Elevated troponin - Troponin elevation likely demand ischemia in setting of respiratory distress (acute) --  Cardiology consult appreciated  -no ACS concerns at this time -outpatient CT coronary advised      Hyperlipidemia - Continue Crestor   Leukocytosis - White blood cell count remains elevated but is coming down -leukemoid reaction from high dose steroids -No active disease on chest x-ray -COVID negative -Procalcitonin <0.10     Pulmonary Embolus RULED OUT --CTA chest without definite evidence of PE - Ultrasound bilateral LE negative for DVT.  -V/Q scan done on 7/8 with no evidence of PE also reassuring that there is no PE -Echo reassuring : no heart strain noted -DC heparin/apixaban and full anticoagulation at this point as we have ruled out PE   Essential hypertension - Patient is on an ARB and HCTZ at baseline which is being held due to soft BP and bump in creatinine -BP medications adjusted   code Status: Full  Family Communication: son and daughter-in-law at bedside Disposition: Home   Consultants:  PCCM Dr. Melvyn Novas , [facility]  Discharge Condition: stable  Follow UP   Follow-up Information     Tanda Rockers, MD. Go on 07/12/2022.   Specialty: Pulmonary Disease Why: Needs to be seen in [place]---may need PFT  tests Contact information: [address] [place] [address] [phone]                 Diet and Activity recommendation:  As advised  Discharge Instructions    Discharge Instructions     Call MD for:  difficulty breathing, headache or visual disturbances   Complete by: As directed    Call MD for:  persistant dizziness or light-headedness   Complete by: As directed    Call MD for:  persistant nausea and vomiting   Complete by: As directed    Call MD for:  severe uncontrolled pain   Complete by: As directed    Call MD for:  temperature >100.4   Complete by: As directed    Diet - low sodium heart healthy   Complete by: As directed    Discharge instructions   Complete by: As directed    1)Take Prednisone 20 mg tablets---Take 2 tablets daily with breakfast for 4 days then 1 tablet daily with breakfast for 4 days, then stop  2) Follow - up with Dr. Melvyn Novas Pulmonologist in [place]- on 07/12/22--- you may benefit from a pulmonary function test to determine if you have Asthma or COPD -Address: [address] [place], [place] [address] Phone: [phone] OR call [phone] -Patient needs to be seen in the [place] office, Not in [place]  3) please note that your blood pressure medication has been adjusted   Increase activity slowly   Complete by: As directed          Discharge Medications     Allergies as of 06/09/2022   No Known Allergies      Medication List     TAKE these medications    acetaminophen 500 MG tablet Commonly known as: TYLENOL Take 500 mg by mouth daily as needed for mild pain (for pain).   albuterol 108 (90 Base) MCG/ACT inhaler Commonly known as: VENTOLIN HFA Inhale 2 puffs into the lungs every 4 (four) hours as needed for wheezing or shortness of breath. What changed:  how much to take when to take this reasons to take this   albuterol (2.5 MG/3ML) 0.083% nebulizer solution Commonly known as: PROVENTIL Take 3  mLs (2.5 mg total) by nebulization every 4 (four) hours as needed for wheezing  or shortness of breath. What changed: You were already taking a medication with the same name, and this prescription was added. Make sure you understand how and when to take each.   cetirizine 10 MG tablet Commonly known as: ZYRTEC Take 10 mg by mouth daily.   diclofenac Sodium 1 % Gel Commonly known as: VOLTAREN Apply 2 g topically 4 (four) times daily.   fluticasone 50 MCG/ACT nasal spray Commonly known as: FLONASE Place 2 sprays into both nostrils daily. What changed:  how much to take when to take this   guaiFENesin 600 MG 12 hr tablet Commonly known as: Mucinex Take 1 tablet (600 mg total) by mouth 2 (two) times daily.   losartan-hydrochlorothiazide 100-25 MG tablet Commonly known as: HYZAAR Take 0.5 tablets by mouth daily. What changed: how much to take   montelukast 10 MG tablet Commonly known as: SINGULAIR Take 1 tablet (10 mg total) by mouth daily.   omeprazole 20 MG tablet Commonly known as: PriLOSEC OTC Take 1 tablet (20 mg total) by mouth daily.   predniSONE 20 MG tablet Commonly known as: DELTASONE Take 1-2 tablets (20-40 mg total) by mouth See admin instructions for 5 days. Take 2 tablets daily with breakfast for 4 days then 1 tablet daily with breakfast for 4 days, then stop   rosuvastatin 5 MG tablet Commonly known as: CRESTOR Take 5 mg by mouth 2 (two) times a week. 'Sundays & Thursdays   Trelegy Ellipta 100-62.5-25 MCG/ACT Aepb Generic drug: Fluticasone-Umeclidin-Vilant Inhale 1 puff into the lungs daily.   TRUBIOTICS PO Take 1 capsule by mouth daily.               Durable Medical Equipment  (From admission, onward)           Start     Ordered   06/09/22 0808  For home use only DME Nebulizer/meds  Once       Question Answer Comment  Patient needs a nebulizer to treat with the following condition COPD exacerbation (HCC)   Length of Need Lifetime       06/09/22 0807   06/09/22 0807  For home use only DME Nebulizer machine  Once       Question Answer Comment  Patient needs a nebulizer to treat with the following condition COPD (chronic obstructive pulmonary disease) (HCC)   Length of Need Lifetime      07'$ /13/23 0806   06/06/22 1214  For home use only DME Cane  Once       Comments: Patient exhibits decreased stability and difficulty walking.   06/06/22 1219            Major procedures and Radiology Reports - PLEASE review detailed and final reports for all details, in brief -    NM Pulmonary Perfusion  Result Date: 06/04/2022 CLINICAL DATA:  Chest pain and cough. EXAM: NUCLEAR MEDICINE PERFUSION LUNG SCAN TECHNIQUE: Perfusion images were obtained in multiple projections after intravenous injection of radiopharmaceutical. Ventilation scans intentionally deferred if perfusion scan and chest x-ray adequate for interpretation during COVID 19 epidemic. RADIOPHARMACEUTICALS:  4.4 mCi Tc-27mMAA IV COMPARISON:  Chest CTA 06/02/2022 FINDINGS: Heterogeneous perfusion in the lung apices may reflect an underlying component of emphysema. No peripheral segmental perfusion defect in either lung. IMPRESSION: No scintigraphic evidence for pulmonary embolus. Electronically Signed   By: EMisty StanleyM.D.   On: 06/04/2022 12:45   CT Angio Chest Pulmonary Embolism (PE) W or WO Contrast  Result Date: 06/02/2022 CLINICAL DATA:  Pulmonary embolism (PE) suspected, high prob. Repeat filling defect from CTA Chest on 06/01/2022, patient c/o shortness of breath EXAM: CT ANGIOGRAPHY CHEST WITH CONTRAST TECHNIQUE: Multidetector CT imaging of the chest was performed using the standard protocol during bolus administration of intravenous contrast. Multiplanar CT image reconstructions and MIPs were obtained to evaluate the vascular anatomy. RADIATION DOSE REDUCTION: This exam was performed according to the departmental dose-optimization program which includes automated  exposure control, adjustment of the mA and/or kV according to patient size and/or use of iterative reconstruction technique. CONTRAST:  161m OMNIPAQUE IOHEXOL 350 MG/ML SOLN COMPARISON:  CT angio chest 06/01/2022 FINDINGS: Cardiovascular: Preferential opacification of the thoracic aorta. Fair opacification of the pulmonary arteries to the proximal segmental level. No evidence of pulmonary embolism. Normal heart size. No significant pericardial effusion. No thoracic aorta dissection. The thoracic aorta is normal in caliber. Mild atherosclerotic plaque of the thoracic aorta. Three-vessel coronary artery calcifications. Mediastinum/Nodes: No enlarged mediastinal, hilar, or axillary lymph nodes. Thyroid gland, trachea, and esophagus demonstrate no significant findings. Lungs/Pleura: Diffuse bronchial wall thickening. No focal consolidation. No pulmonary nodule. No pulmonary mass. No pleural effusion. No pneumothorax. Upper Abdomen: No acute abnormality. Musculoskeletal: No chest wall abnormality. Right shoulder rotator cuff anchor suture. No suspicious lytic or blastic osseous lesions. No acute displaced fracture. Multilevel degenerative changes of the spine. Review of the MIP images confirms the above findings. IMPRESSION: 1. No central or proximal segmental pulmonary embolus. Markedly limited evaluation more distally due to timing of contrast. Unable to evaluate previously question left lower lobe pulmonary embolus. 2. No acute aortic abnormality. 3. Diffuse bronchial wall thickening consistent with smoking-related small airway disease. 4. Aortic Atherosclerosis (ICD10-I70.0) including three-vessel coronary calcification. Electronically Signed   By: MIven FinnM.D.   On: 06/02/2022 18:33   UKoreaVenous Img Lower Bilateral (DVT)  Result Date: 06/02/2022 CLINICAL DATA:  Shortness of breath.  Evaluate for DVT. EXAM: BILATERAL LOWER EXTREMITY VENOUS DOPPLER ULTRASOUND TECHNIQUE: Gray-scale sonography with graded  compression, as well as color Doppler and duplex ultrasound were performed to evaluate the lower extremity deep venous systems from the level of the common femoral vein and including the common femoral, femoral, profunda femoral, popliteal and calf veins including the posterior tibial, peroneal and gastrocnemius veins when visible. The superficial great saphenous vein was also interrogated. Spectral Doppler was utilized to evaluate flow at rest and with distal augmentation maneuvers in the common femoral, femoral and popliteal veins. COMPARISON:  Right lower extremity venous Doppler ultrasound-06/29/2006 (negative). FINDINGS: RIGHT LOWER EXTREMITY Common Femoral Vein: No evidence of thrombus. Normal compressibility, respiratory phasicity and response to augmentation. Saphenofemoral Junction: No evidence of thrombus. Normal compressibility and flow on color Doppler imaging. Profunda Femoral Vein: No evidence of thrombus. Normal compressibility and flow on color Doppler imaging. Femoral Vein: No evidence of thrombus. Normal compressibility, respiratory phasicity and response to augmentation. Popliteal Vein: No evidence of thrombus. Normal compressibility, respiratory phasicity and response to augmentation. Calf Veins: No evidence of thrombus. Normal compressibility and flow on color Doppler imaging. Superficial Great Saphenous Vein: No evidence of thrombus. Normal compressibility. Other Findings:  None. LEFT LOWER EXTREMITY Common Femoral Vein: No evidence of thrombus. Normal compressibility, respiratory phasicity and response to augmentation. Saphenofemoral Junction: No evidence of thrombus. Normal compressibility and flow on color Doppler imaging. Profunda Femoral Vein: No evidence of thrombus. Normal compressibility and flow on color Doppler imaging. Femoral Vein: No evidence of thrombus. Normal compressibility, respiratory phasicity and response to augmentation. Popliteal Vein: No evidence of thrombus. Normal  compressibility, respiratory phasicity and response to augmentation. Calf Veins: No evidence of thrombus. Normal compressibility and flow on color Doppler imaging. Superficial Great Saphenous Vein: No evidence of thrombus. Normal compressibility. Other Findings:  None. IMPRESSION: No evidence of DVT within either lower extremity. Electronically Signed   By: Sandi Mariscal M.D.   On: 06/02/2022 11:55   ECHOCARDIOGRAM COMPLETE  Result Date: 06/02/2022    ECHOCARDIOGRAM REPORT   Patient Name:   CECE MILHOUSE Date of Exam: 06/02/2022 Medical Rec #:  [id number]      Height:       65.0 in Accession #:    [id number]     Weight:       175.0 lb Date of Birth:  1946-03-25      BSA:          1.869 m Patient Age:    3 years       BP:           133/72 mmHg Patient Gender: F              HR:           102 bpm. Exam Location:  [facility] Procedure: 2D Echo, Cardiac Doppler and Color Doppler Indications:    Pulmonary embolus  History:        Patient has no prior history of Echocardiogram examinations.                 Risk Factors:Hypertension and Dyslipidemia.  Sonographer:    Wenda Low Referring Phys: [id number] ASIA B [place]  1. Left ventricular ejection fraction, by estimation, is 70 to 75%. The left ventricle has hyperdynamic function. The left ventricle has no regional wall motion abnormalities. There is moderate hypertrophy of the basal septal segment. The rest of the LV  segments demonstrate mild left ventricular hypertrophy. Left ventricular diastolic parameters are consistent with Grade I diastolic dysfunction (impaired relaxation).  2. Right ventricular systolic function is normal. The right ventricular size is normal. There is mildly elevated pulmonary artery systolic pressure. The estimated right ventricular systolic pressure is 13.2 mmHg.  3. The mitral valve is grossly normal. Trivial mitral valve regurgitation.  4. The aortic valve is tricuspid. There is mild calcification of the aortic valve.  There is mild thickening of the aortic valve. Aortic valve regurgitation is not visualized. Aortic valve sclerosis/calcification is present, without any evidence of aortic stenosis.  5. The inferior vena cava is dilated in size with >50% respiratory variability, suggesting right atrial pressure of 8 mmHg. Comparison(s): No prior Echocardiogram. FINDINGS  Left Ventricle: Left ventricular ejection fraction, by estimation, is 70 to 75%. The left ventricle has hyperdynamic function. The left ventricle has no regional wall motion abnormalities. The left ventricular internal cavity size was normal in size. There is moderate hypertrophy of the basal septal segment. The rest of the LV segments demonstrate mild left ventricular hypertrophy. Left ventricular diastolic parameters are consistent with Grade I diastolic dysfunction (impaired relaxation). Right Ventricle: The right ventricular size is normal. No increase in right ventricular wall thickness. Right ventricular systolic function is normal. There is mildly elevated pulmonary artery systolic pressure. The tricuspid regurgitant velocity is 2.84  m/s, and with an assumed right atrial pressure of 8 mmHg, the estimated right ventricular systolic pressure is 44.0 mmHg. Left Atrium: Left atrial size was normal in size. Right Atrium: Right atrial size was normal in size. Pericardium: There is no evidence of pericardial effusion. Mitral Valve: The mitral valve is grossly  normal. There is mild thickening of the mitral valve leaflet(s). Trivial mitral valve regurgitation. MV peak gradient, 4.7 mmHg. The mean mitral valve gradient is 2.0 mmHg. Tricuspid Valve: The tricuspid valve is normal in structure. Tricuspid valve regurgitation is trivial. Aortic Valve: The aortic valve is tricuspid. There is mild calcification of the aortic valve. There is mild thickening of the aortic valve. Aortic valve regurgitation is not visualized. Aortic valve sclerosis/calcification is present,  without any evidence of aortic stenosis. Aortic valve mean gradient measures 7.0 mmHg. Aortic valve peak gradient measures 14.8 mmHg. Aortic valve area, by VTI measures 2.17 cm. Pulmonic Valve: The pulmonic valve was normal in structure. Pulmonic valve regurgitation is trivial. Aorta: The aortic root is normal in size and structure. Venous: The inferior vena cava is dilated in size with greater than 50% respiratory variability, suggesting right atrial pressure of 8 mmHg. IAS/Shunts: The atrial septum is grossly normal.  LEFT VENTRICLE PLAX 2D LVIDd:         4.30 cm   Diastology LVIDs:         2.40 cm   LV e' medial:    6.85 cm/s LV PW:         1.30 cm   LV E/e' medial:  15.0 LV IVS:        1.20 cm   LV e' lateral:   10.00 cm/s LVOT diam:     2.00 cm   LV E/e' lateral: 10.3 LV SV:         75 LV SV Index:   40 LVOT Area:     3.14 cm  RIGHT VENTRICLE RV Basal diam:  3.20 cm RV Mid diam:    2.60 cm RV S prime:     17.60 cm/s TAPSE (M-mode): 2.5 cm LEFT ATRIUM             Index        RIGHT ATRIUM           Index LA diam:        3.60 cm 1.93 cm/m   RA Area:     15.40 cm LA Vol (A2C):   57.4 ml 30.71 ml/m  RA Volume:   36.00 ml  19.26 ml/m LA Vol (A4C):   53.3 ml 28.51 ml/m LA Biplane Vol: 58.3 ml 31.19 ml/m  AORTIC VALVE                     PULMONIC VALVE AV Area (Vmax):    2.20 cm      PV Vmax:       1.23 m/s AV Area (Vmean):   2.23 cm      PV Peak grad:  6.1 mmHg AV Area (VTI):     2.17 cm AV Vmax:           192.67 cm/s AV Vmean:          124.667 cm/s AV VTI:            0.348 m AV Peak Grad:      14.8 mmHg AV Mean Grad:      7.0 mmHg LVOT Vmax:         135.00 cm/s LVOT Vmean:        88.600 cm/s LVOT VTI:          0.240 m LVOT/AV VTI ratio: 0.69  AORTA Ao Root diam: 2.60 cm Ao Asc diam:  2.90 cm MITRAL VALVE  TRICUSPID VALVE MV Area (PHT): 4.29 cm     TR Peak grad:   32.3 mmHg MV Area VTI:   2.34 cm     TR Vmax:        284.00 cm/s MV Peak grad:  4.7 mmHg MV Mean grad:  2.0 mmHg     SHUNTS MV  Vmax:       1.08 m/s     Systemic VTI:  0.24 m MV Vmean:      71.0 cm/s    Systemic Diam: 2.00 cm MV Decel Time: 177 msec MV E velocity: 103.00 cm/s MV A velocity: 108.00 cm/s MV E/A ratio:  0.95 Gwyndolyn Kaufman MD Electronically signed by Gwyndolyn Kaufman MD Signature Date/Time: 06/02/2022/10:48:34 AM    Final    CT Angio Chest PE W and/or Wo Contrast  Result Date: 06/01/2022 CLINICAL DATA:  Shortness of breath, concern for pulmonary embolism. EXAM: CT ANGIOGRAPHY CHEST WITH CONTRAST TECHNIQUE: Multidetector CT imaging of the chest was performed using the standard protocol during bolus administration of intravenous contrast. Multiplanar CT image reconstructions and MIPs were obtained to evaluate the vascular anatomy. RADIATION DOSE REDUCTION: This exam was performed according to the departmental dose-optimization program which includes automated exposure control, adjustment of the mA and/or kV according to patient size and/or use of iterative reconstruction technique. CONTRAST:  89m OMNIPAQUE IOHEXOL 350 MG/ML SOLN COMPARISON:  Chest radiograph performed the same day. FINDINGS: Cardiovascular: Satisfactory opacification of the pulmonary arteries to the segmental level. There is a questionable filling defect in a subsegmental pulmonary artery supplying the left lower lobe (series 5, image [id number]). Vascular calcifications are seen in the aortic arch. Normal heart size. No pericardial effusion. Mediastinum/Nodes: No enlarged mediastinal, hilar, or axillary lymph nodes. Thyroid gland, trachea, and esophagus demonstrate no significant findings. Lungs/Pleura: Lungs are clear. No pleural effusion or pneumothorax. Upper Abdomen: No acute abnormality. Musculoskeletal: Degenerative changes are seen in the spine. Review of the MIP images confirms the above findings. IMPRESSION: 1. Questionable small filling defect in a subsegmental pulmonary artery supplying the left lower lobe may represent a pulmonary embolism. If it  would help direct clinical management, a follow-up CT PE protocol could be performed in 24 hours to confirm the presence/absence of this finding. Aortic Atherosclerosis (ICD10-I70.0). Electronically Signed   By: TZerita BoersM.D.   On: 06/01/2022 18:44   DG Chest Port 1 View  Result Date: 06/01/2022 CLINICAL DATA:  Shortness of breath EXAM: PORTABLE CHEST 1 VIEW COMPARISON:  None Available. FINDINGS: The heart size and mediastinal contours are within normal limits. Both lungs are clear. Degenerative joint changes of bilateral shoulders are noted. Scoliosis of spine noted. IMPRESSION: No active disease. Electronically Signed   By: WAbelardo DieselM.D.   On: 06/01/2022 15:32    Micro Results   Recent Results (from the past 240 hour(s))  SARS Coronavirus 2 by RT PCR (hospital order, [facility]) *cepheid single result test* Anterior Nasal Swab     Status: None   Collection Time: 06/01/22  8:01 PM   Specimen: Anterior Nasal Swab  Result Value Ref Range Status   SARS Coronavirus 2 by RT PCR NEGATIVE NEGATIVE Final    Comment: (NOTE) SARS-CoV-2 target nucleic acids are NOT DETECTED.  The SARS-CoV-2 RNA is generally detectable in upper and lower respiratory specimens during the acute phase of infection. The lowest concentration of SARS-CoV-2 viral copies this assay can detect is 250 copies / mL. A negative result does not preclude SARS-CoV-2  infection and should not be used as the sole basis for treatment or other patient management decisions.  A negative result may occur with improper specimen collection / handling, submission of specimen other than nasopharyngeal swab, presence of viral mutation(s) within the areas targeted by this assay, and inadequate number of viral copies (<250 copies / mL). A negative result must be combined with clinical observations, patient history, and epidemiological information.  Fact Sheet for Patients:    https://www.patel.info/  Fact Sheet for Healthcare Providers: https://hall.com/  This test is not yet approved or  cleared by the [place] FDA and has been authorized for detection and/or diagnosis of SARS-CoV-2 by FDA under an Emergency Use Authorization (EUA).  This EUA will remain in effect (meaning this test can be used) for the duration of the COVID-19 declaration under Section 564(b)(1) of the Act, 21 U.S.C. section 360bbb-3(b)(1), unless the authorization is terminated or revoked sooner.  Performed at [facility], [address], [place], [place] [address]   MRSA Next Gen by PCR, Nasal     Status: None   Collection Time: 06/01/22  9:35 PM   Specimen: Nasal Mucosa; Nasal Swab  Result Value Ref Range Status   MRSA by PCR Next Gen NOT DETECTED NOT DETECTED Final    Comment: (NOTE) The GeneXpert MRSA Assay (FDA approved for NASAL specimens only), is one component of a comprehensive MRSA colonization surveillance program. It is not intended to diagnose MRSA infection nor to guide or monitor treatment for MRSA infections. Test performance is not FDA approved in patients less than 1 years old. Performed at [facility], [address] [place], [place] [address]   Respiratory (~20 pathogens) panel by PCR     Status: None   Collection Time: 06/02/22  8:33 AM   Specimen: Nasopharyngeal Swab; Respiratory  Result Value Ref Range Status   Adenovirus NOT DETECTED NOT DETECTED Final   Coronavirus 229E NOT DETECTED NOT DETECTED Final    Comment: (NOTE) The Coronavirus on the Respiratory Panel, DOES NOT test for the novel  Coronavirus (2019 nCoV)    Coronavirus HKU1 NOT DETECTED NOT DETECTED Final   Coronavirus NL63 NOT DETECTED NOT DETECTED Final   Coronavirus OC43 NOT DETECTED NOT DETECTED Final   Metapneumovirus NOT DETECTED NOT DETECTED Final   Rhinovirus / Enterovirus NOT DETECTED NOT DETECTED Final   Influenza A NOT DETECTED NOT  DETECTED Final   Influenza B NOT DETECTED NOT DETECTED Final   Parainfluenza Virus 1 NOT DETECTED NOT DETECTED Final   Parainfluenza Virus 2 NOT DETECTED NOT DETECTED Final   Parainfluenza Virus 3 NOT DETECTED NOT DETECTED Final   Parainfluenza Virus 4 NOT DETECTED NOT DETECTED Final   Respiratory Syncytial Virus NOT DETECTED NOT DETECTED Final   Bordetella pertussis NOT DETECTED NOT DETECTED Final   Bordetella Parapertussis NOT DETECTED NOT DETECTED Final   Chlamydophila pneumoniae NOT DETECTED NOT DETECTED Final   Mycoplasma pneumoniae NOT DETECTED NOT DETECTED Final    Comment: Performed at [facility], [place] [address], [place], [place] [address]    Today   Subjective    Maleiyah Releford today has no new complaints -Patient's son and daughter-in-law at bedside No further hypoxia -No dyspnea on exertion -No productive cough No fever  Or chills           Patient has been seen and examined prior to discharge   Objective   Blood pressure 126/69, pulse 74, temperature (!) 97.4 F (36.3 C), temperature source Oral, resp.  rate 18, height '5\' 5"'$  (1.651 m), weight 81.8 kg, SpO2 94 %.   Intake/Output Summary (Last 24 hours) at 06/09/2022 0947 Last data filed at 06/09/2022 0900 Gross per 24 hour  Intake 480 ml  Output --  Net 480 ml    Exam Gen:- Awake Alert, no acute distress, speaking in complete sentences HEENT:- [place].AT, No sclera icterus Neck-Supple Neck,No JVD,.  Lungs-improved air movement, no wheezing  CV- S1, S2 normal, regular Abd-  +ve B.Sounds, Abd Soft, No tenderness,    Extremity/Skin:- No  edema,   good pulses Psych-affect is appropriate, oriented x3 Neuro-no new focal deficits, no tremors    Data Review   CBC w Diff:  Lab Results  Component Value Date   WBC 25.8 (H) 06/08/2022   HGB 11.6 (L) 06/08/2022   HCT 35.0 (L) 06/08/2022   PLT 343 06/08/2022   LYMPHOPCT 16 06/01/2022   MONOPCT 5 06/01/2022   EOSPCT 4 06/01/2022   BASOPCT 0 06/01/2022     CMP:  Lab Results  Component Value Date   NA 135 06/08/2022   K 3.6 06/08/2022   CL 98 06/08/2022   CO2 25 06/08/2022   BUN 29 (H) 06/08/2022   CREATININE 1.05 (H) 06/08/2022   PROT 7.5 06/02/2022   ALBUMIN 4.0 06/02/2022   BILITOT 0.3 06/02/2022   ALKPHOS 37 (L) 06/02/2022   AST 42 (H) 06/02/2022   ALT 28 06/02/2022  .  Total Discharge time is about 33 minutes  Roxan Hockey M.D on 06/09/2022 at 9:47 AM  Go to [email or web address] -  for contact info  [facility] - Office  [phone]

## 2022-06-09 NOTE — TOC Transition Note (Signed)
Transition of Care [facility]) - CM/SW Discharge Note   Patient Details  Name: TONYETTA BERKO MRN: [id number] Date of Birth: 08/26/1946  Transition of Care [facility]) CM/SW Contact:  Shade Flood, LCSW Phone Number: 06/09/2022, 10:18 AM   Clinical Narrative:     Pt stable for dc home with neb machine today per MD. Damaris Schooner with pt's son to review dc planning. Offered CMS provider options for DME and referred to [facility] for neb machine. Per [facility] at [place], they will deliver the machine to pt's home later today. They will call pt/family to schedule delivery.  There are no other TOC needs for dc.  Final next level of care: Home/Self Care Barriers to Discharge: Barriers Resolved   Patient Goals and CMS Choice Patient states their goals for this hospitalization and ongoing recovery are:: go home CMS Medicare.gov Compare Post Acute Care list provided to:: Patient Represenative (must comment) Choice offered to / list presented to : Adult Children  Discharge Placement                       Discharge Plan and Services                DME Arranged: Nebulizer machine DME Agency: [facility] Date DME Agency Contacted: 06/09/22   Representative spoke with at DME Agency: Caryl Pina            Social Determinants of Health ([place]) Interventions     Readmission Risk Interventions     No data to display

## 2022-06-09 NOTE — Progress Notes (Signed)
[facility]   Patient name: Tammy [name] Admit date: 06/01/2022  DOB: January 07, 1946 LOS: 8  MRN: [id number] Consult date: 06/02/2022  Referring provider: Dr. Wynetta Emery, [facility]: Dyspnea    History:  76 yo female former smoker with hx of asthma presented to [facility] with 4 days of worsening dyspnea with chest discomfort.  Noted to have wheezing.  She was found to have elevated troponin.  PCCM consulted to assist with respiratory management.  Past medical history:  HTN, HLD  Significant events:  7/05 Admit to SDU, start solumedrol and heparin gtt 7/06 Cardiology consulted for elevated troponin 7/08 heparin gtt stopped 7/10 cardiology sign off 7/12 change to prednisone  Studies:  CT angio chest 06/01/22 >> possible filling defect in subsegmental artery LLL Echo 06/02/22 >> EF 70 to 75%, mod LVH, grade 1 DD, RVSP 40.3 mmHg Doppler legs b/l 06/02/22 >> no DVT CT angio chest 06/02/22 >> diffuse bronchial wall thickening, no PE V/Q scan 06/04/22 >> no PE  Micro:  COVID 7/05 >> negative RVP 7/06 >> negative  Lines:     Antibiotics:  Zithromax 7/10 >>   Consults:  Cardiology    Interim history:  Did well with PT yesterday.  Feels like she is ready to go home.  Vital signs:  BP 126/69 (BP Location: Right Arm)   Pulse 74   Temp (!) 97.4 F (36.3 C) (Oral)   Resp 18   Ht '5\' 5"'$  (1.651 m)   Wt 81.8 kg   SpO2 94%   BMI 30.01 kg/m   Intake/output:  I/O last 3 completed shifts: In: 74 [P.O.:540] Out: 400 [Urine:400]   Physical exam:   General - alert Eyes - pupils reactive ENT - no sinus tenderness, no stridor Cardiac - regular rate/rhythm, no murmur Chest - equal breath sounds b/l, no wheezing or rales Abdomen - soft, non tender, + bowel sounds Extremities - no cyanosis, clubbing, or edema Skin - no rashes Neuro - normal strength, moves extremities, follows commands Psych - normal mood and behavior      Best  practice:   DVT - SQ heparin SUP - Protonix Nutrition - Heart healthy   Assessment/plan:   Acute asthma exacerbation. - Abx day 4 of 5, currently on zithromax - transition back to trelegy 100 one puff daily as home - prn albuterol; she will need to have a home nebulizer set up - continue singulair 10 mg qhs - prn mucinex, flutter valve when she has more productive cough - tessalon 200 mg tid prn when she has a dry cough - can change to prednisone 20 mg daily on 7/14, and decrease by 5 mg increments every 2 days until she is off prednisone - she has pulmonary follow up scheduled with Dr. Melvyn Novas on 07/12/22 in [place] office  Allergic rhinitis. - continue claritin, singulair - nose sprays caused throat irritation  Elevated troponin likely from demand ischemia. Hx of HTN, HLD. - per primary team - cardiology signed off  Please call if additional assistance needed while she is in hospital.  Goals of care/Family discussions:  Code status: full  Updated pt's family at bedside.  Labs:      Latest Ref Rng & Units 06/08/2022    5:59 AM 06/06/2022    5:38 AM 06/04/2022    3:55 AM  CMP  Glucose 70 - 99 mg/dL 122  122  142   BUN 8 - 23 mg/dL 29  26  34   Creatinine  0.44 - 1.00 mg/dL 1.05  1.03  1.03   Sodium 135 - 145 mmol/L 135  136  136   Potassium 3.5 - 5.1 mmol/L 3.6  4.6  4.4   Chloride 98 - 111 mmol/L 98  106  103   CO2 22 - 32 mmol/L '25  26  26   '$ Calcium 8.9 - 10.3 mg/dL 8.5  8.6  9.0        Latest Ref Rng & Units 06/08/2022    5:59 AM 06/06/2022    5:38 AM 06/04/2022    3:55 AM  CBC  WBC 4.0 - 10.5 K/uL 25.8  18.1  19.5   Hemoglobin 12.0 - 15.0 g/dL 11.6  12.7  11.9   Hematocrit 36.0 - 46.0 % 35.0  39.4  37.1   Platelets 150 - 400 K/uL 343  307  316     ABG    Component Value Date/Time   HCO3 24.2 06/01/2022 2012   ACIDBASEDEF 0.7 06/01/2022 2012   O2SAT 86.1 06/01/2022 2012     Signature:  Chesley Mires, MD [place] Pager - [phone] 06/09/2022, 7:51 AM

## 2022-06-24 DIAGNOSIS — Z6829 Body mass index (BMI) 29.0-29.9, adult: Secondary | ICD-10-CM | POA: Diagnosis not present

## 2022-06-24 DIAGNOSIS — J4551 Severe persistent asthma with (acute) exacerbation: Secondary | ICD-10-CM | POA: Diagnosis not present

## 2022-06-24 DIAGNOSIS — I10 Essential (primary) hypertension: Secondary | ICD-10-CM | POA: Diagnosis not present

## 2022-06-24 DIAGNOSIS — R778 Other specified abnormalities of plasma proteins: Secondary | ICD-10-CM | POA: Diagnosis not present

## 2022-06-24 DIAGNOSIS — E663 Overweight: Secondary | ICD-10-CM | POA: Diagnosis not present

## 2022-06-28 DIAGNOSIS — I10 Essential (primary) hypertension: Secondary | ICD-10-CM | POA: Diagnosis not present

## 2022-07-12 ENCOUNTER — Ambulatory Visit (INDEPENDENT_AMBULATORY_CARE_PROVIDER_SITE_OTHER): Payer: Medicare Other | Admitting: Internal Medicine

## 2022-07-12 ENCOUNTER — Encounter: Payer: Self-pay | Admitting: Internal Medicine

## 2022-07-12 DIAGNOSIS — I10 Essential (primary) hypertension: Secondary | ICD-10-CM

## 2022-07-12 DIAGNOSIS — J9601 Acute respiratory failure with hypoxia: Secondary | ICD-10-CM | POA: Diagnosis not present

## 2022-07-12 DIAGNOSIS — R058 Other specified cough: Secondary | ICD-10-CM | POA: Insufficient documentation

## 2022-07-12 MED ORDER — OMEPRAZOLE 20 MG PO CPDR: DELAYED_RELEASE_CAPSULE | ORAL | Status: DC

## 2022-07-12 MED ORDER — PREDNISONE 10 MG PO TABS: ORAL_TABLET | ORAL | 0 refills | Status: DC

## 2022-07-12 MED ORDER — VALSARTAN-HYDROCHLOROTHIAZIDE 160-25 MG PO TABS: 1.0000 | ORAL_TABLET | Freq: Every day | ORAL | 2 refills | Status: DC

## 2022-07-12 MED ORDER — GABAPENTIN 100 MG PO CAPS: 100.0000 mg | ORAL_CAPSULE | Freq: Three times a day (TID) | ORAL | 2 refills | Status: DC

## 2022-07-12 MED ORDER — PANTOPRAZOLE SODIUM 40 MG PO TBEC: 40.0000 mg | DELAYED_RELEASE_TABLET | Freq: Every day | ORAL | 2 refills | Status: DC

## 2022-07-12 MED ORDER — ACETAMINOPHEN-CODEINE 300-30 MG PO TABS: 1.0000 | ORAL_TABLET | ORAL | 0 refills | Status: AC | PRN

## 2022-07-12 MED ORDER — FAMOTIDINE 20 MG PO TABS: ORAL_TABLET | ORAL | 11 refills | Status: DC

## 2022-07-12 NOTE — Patient Instructions (Addendum)
Gabapentin 100 mg three times daily and every few days increase to 300 mg three times daily   Pantoprazole (protonix) 40 mg  (or omeprazole 20 x 2)   Take  30-60 min before first meal of the day and Pepcid (famotidine)  20 mg after supper until return to office - this is the best way to tell whether stomach acid is contributing to your problem.    For drainage / throat tickle try take CHLORPHENIRAMINE  4 mg  ("Allergy Relief" '4mg'$   at [facility] should be easiest to find in the blue box usually on bottom shelf)  take one every 4 hours as needed - extremely effective and inexpensive over the counter- may cause drowsiness so start with just a dose or two an hour before bedtime and see how you tolerate it before trying in daytime.   Prednisone 10 mg take  4 each am x 2 days,   2 each am x 2 days,  1 each am x 2 days and stop   For breathing difficulty > albuterol  neb up to every 4 hours as needed  Take delsym two tsp every 12 hours and supplement if needed with  Tylenol #3   up to 1-2 every 4 hours to suppress the urge to cough. Swallowing water and/or using ice chips/non mint and menthol containing candies (such as lifesavers or sugarless jolly ranchers) are also effective.  You should rest your voice and avoid activities that you know make you cough.  Once you have eliminated the cough for 3 straight days try reducing the Tylenol #3 first,  then the delsym as tolerated.     Valsartan 160-25 one daily in place of losartan   Please schedule a follow up office visit in 2 weeks, sooner if needed  with all medications /inhalers/ solutions in hand so we can verify exactly what you are taking. This includes all medications from all doctors and over the counters

## 2022-07-12 NOTE — Assessment & Plan Note (Signed)
Try off losartan 07/12/2022 due to cough  - rx valsartan 160-25 one daily 07/12/2022 >>>   Advised on how to do med reconciliation at home "like a check book/balancing"   Each maintenance medication was reviewed in detail including emphasizing most importantly the difference between maintenance and prns and under what circumstances the prns are to be triggered using an action plan format where appropriate.  Total time for H and P, chart review, counseling, reviewing neb device(s) , directly observing portions of ambulatory 02 saturation study/ and generating customized AVS unique to this office visit / same day charting = > 40 min post hosp f/u ov

## 2022-07-12 NOTE — Assessment & Plan Note (Signed)
Onset was June 2023 - Allergy screen 07/12/2022 >  Eos 0. /  IgE   - 07/12/2022 started gabapentin titrate to max of 100 mg x 3 tid / max gerd rx and 1st gen H1 blockers per guidelines  Hs and f/u q 2 weeks/ off losartan   Upper airway cough syndrome (previously labeled PNDS),  is so named because it's frequently impossible to sort out how much is  CR/sinusitis with freq throat clearing (which can be related to primary GERD)   vs  causing  secondary (" extra esophageal")  GERD from wide swings in gastric pressure that occur with throat clearing, often  promoting self use of mint and menthol lozenges that reduce the lower esophageal sphincter tone and exacerbate the problem further in a cyclical fashion.   These are the same pts (now being labeled as having "irritable larynx syndrome" by some cough centers) who not infrequently have a history of having failed to tolerate ace inhibitors ( or lorartan for reasons that may related to vascular permability and nitric oxide pathways but not elevated  bradykinin levels (as seen with  ACEi use) losartan in the generic form has been reported now from mulitple sources  to cause a similar pattern of non-specific  upper airway symptoms as seen with acei.)   This has not been reported with exposure to the other ARB's to date, so it seems reasonable for now to try either generic diovan or avapro if ARB needed or use an alternative class altogether.  See:  Lelon Frohlich Allergy Asthma Immunol  2008: 101: p [phone]    dry powder inhalers or biphosphonates or report having atypical/extraesophageal reflux symptoms that don't respond to standard doses of PPI  and are easily confused as having aecopd or asthma flares by even experienced allergists/ pulmonologists (myself included).

## 2022-07-12 NOTE — Progress Notes (Signed)
Jani Files, female    DOB: 1946-06-12    MRN: [id number]   Brief patient profile:  13  yobf  stopped smoking age 76  referred to pulmonary clinic in [place]  07/12/2022 by Dr Maryellen Pile for acute resp failure/ severe UACS  with w/u by Benjamine Mola 2019 c/w GERD dx.   Admission date:  06/01/2022  Admitting Physician  Rolla Plate, DO Discharge Date:  06/09/2022    Discharge Diagnosis  Elevated troponin [R77.8] Acute respiratory failure with hypoxia (HCC) [J96.01] Severe asthma with exacerbation, unspecified whether persistent [J45.901] Single subsegmental pulmonary embolism without acute cor pulmonale (HCC) [I26.93]     Principal Problem:   Acute respiratory failure with hypoxia (HCC)   Essential hypertension   Pulmonary Embolus RULED OUT   Leukocytosis   Hyperlipidemia   Severe asthma with exacerbation   Elevated troponin  Acute respiratory failure with hypoxia ([place]) -Patient is a reformed smoker -- VQ scan and CT chest suggest possible emphysema/COPD component given smoking history this is likely -Patient also had significant allergic rhinitis/reactive airway disease -Patient reports no prior official diagnosis of asthma in the past COPD Vs Asthma Vs reactive airway disease in a patient with significant allergic rhinitis -Patient will benefit from outpatient PFT/spirometry once acute symptoms resolve to better delineate exact diagnosis -Patient has no oxygen requirement at baseline Pulmonary consult from Dr. Halford Chessman appreciated -PE work-up essentially negative -Cardiology input appreciated -COVID-negative -Much improved respiratory status after steroids, -Hypoxia resolved, Patient has been weaned off oxygen   History of Present Illness  07/12/2022  Pulmonary/ 1st office eval/ Aleksander Edmiston / [place] [facility] re refractory cough  Chief Complaint  Patient presents with   Hospitalization Follow-up    HFU from admission to [facility] 7/5-7/13 for chronic resp failure.  Patient has still had a  persistent cough since coming home.   Onset nasal stuffy nose /runny late 1970s allergy shots helped  but still needed allergy meds esp spring / fall and worse x 2010 rx by Dr Nevada Crane and bette typically p 2 weeks  rx nasal spray, cortisol, albuterol, short term only  This episode dates back to ? Mid June 2023  Dyspnea:  walking inside house ok  Cough: brought on by use of voice dry  hack worse since x one week p last pred 7/21/ Sleep: pillows on flat bed and still coughs immediately at hs  SABA use: multiple inhalers not helping  No obvious day to day or daytime pattern/variability or assoc excess/ purulent sputum or mucus plugs or hemoptysis or cp or chest tightness, subjective wheeze or overt sinus or hb symptoms.    . Also denies any obvious fluctuation of symptoms with weather or environmental changes or other aggravating or alleviating factors except as outlined above   No unusual exposure hx or h/o childhood pna/ asthma or knowledge of premature birth.  Current Allergies, Complete Past Medical History, Past Surgical History, Family History, and Social History were reviewed in [employer] record.  ROS  The following are not active complaints unless bolded Hoarseness, sore throat, dysphagia= globus  dental problems, itching, sneezing,  nasal congestion or discharge of excess mucus or purulent secretions, ear ache,   fever, chills, sweats, unintended wt loss or wt gain, classically pleuritic or exertional cp,  orthopnea pnd or arm/hand swelling  or leg swelling, presyncope, palpitations, abdominal pain, anorexia, nausea, vomiting, diarrhea  or change in bowel habits or change in bladder habits, change in stools or change in urine, dysuria, hematuria,  rash, arthralgias, visual complaints, headache, numbness, weakness or ataxia or problems with walking or coordination,  change in mood or  memory.            Past Medical History:  Diagnosis Date   Asthma    Hypertension      Outpatient Medications Prior to Visit - - NOTE:   Unable to verify as accurately reflecting what pt takes    Medication Sig Dispense Refill   acetaminophen (TYLENOL) 500 MG tablet Take 500 mg by mouth daily as needed for mild pain (for pain).     albuterol (PROVENTIL) (2.5 MG/3ML) 0.083% nebulizer solution Take 3 mLs (2.5 mg total) by nebulization every 4 (four) hours as needed for wheezing or shortness of breath. 75 mL 2   albuterol (VENTOLIN HFA) 108 (90 Base) MCG/ACT inhaler Inhale 2 puffs into the lungs every 4 (four) hours as needed for wheezing or shortness of breath. 18 g 2   BREZTRI AEROSPHERE 160-9-4.8 MCG/ACT AERO Inhale 2 puffs into the lungs 2 (two) times daily.     cetirizine (ZYRTEC) 10 MG tablet Take 10 mg by mouth daily.     diclofenac Sodium (VOLTAREN) 1 % GEL Apply 2 g topically 4 (four) times daily.     fluticasone (FLONASE) 50 MCG/ACT nasal spray Place 2 sprays into both nostrils daily. 16 g 2   guaiFENesin (MUCINEX) 600 MG 12 hr tablet Take 1 tablet (600 mg total) by mouth 2 (two) times daily. 60 tablet 2   losartan-hydrochlorothiazide (HYZAAR) 100-25 MG tablet Take 0.5 tablets by mouth daily. 45 tablet 3   montelukast (SINGULAIR) 10 MG tablet Take 1 tablet (10 mg total) by mouth daily. 30 tablet 2   omeprazole (PRILOSEC) 20 MG capsule Take 20 mg by mouth daily.     Probiotic Product (TRUBIOTICS PO) Take 1 capsule by mouth daily.     rosuvastatin (CRESTOR) 5 MG tablet Take 5 mg by mouth 2 (two) times a week. Sundays & Thursdays     omeprazole (PRILOSEC OTC) 20 MG tablet Take 1 tablet (20 mg total) by mouth daily. 28 tablet 1   TRELEGY ELLIPTA 100-62.5-25 MCG/ACT AEPB Inhale 1 puff into the lungs daily. 60 each 3   No facility-administered medications prior to visit.     Objective:     BP 132/78 (BP Location: Left Arm, Patient Position: Sitting)   Pulse 84   Temp 98.4 F (36.9 C) (Temporal)   Ht '5\' 5"'$  (1.651 m)   Wt 178 lb 12.8 oz (81.1 kg)   SpO2 98% Comment:  ra  BMI 29.75 kg/m   SpO2: 98 % (ra)  Amb bf coughs whenever using voice   HEENT : Oropharynx  clear      Nasal turbinates nl    NECK :  without  apparent JVD/ palpable Nodes/TM    LUNGS: no acc muscle use,  Nl contour chest which is clear to A and P bilaterally without cough on insp or exp maneuvers   CV:  RRR  no s3 or murmur or increase in P2, and no edema   ABD:  soft and nontender with nl inspiratory excursion in the supine position. No bruits or organomegaly appreciated   MS:  Nl gait/ ext warm without deformities Or obvious joint restrictions  calf tenderness, cyanosis or clubbing    SKIN: warm and dry without lesions    NEURO:  alert, approp, nl sensorium with  no motor or cerebellar deficits apparent.  Assessment   Upper airway cough syndrome Onset was June 2023 - Allergy screen 07/12/2022 >  Eos 0. /  IgE   - 07/12/2022 started gabapentin titrate to max of 100 mg x 3 tid / max gerd rx and 1st gen H1 blockers per guidelines  Hs and f/u q 2 weeks/ off losartan   Upper airway cough syndrome (previously labeled PNDS),  is so named because it's frequently impossible to sort out how much is  CR/sinusitis with freq throat clearing (which can be related to primary GERD)   vs  causing  secondary (" extra esophageal")  GERD from wide swings in gastric pressure that occur with throat clearing, often  promoting self use of mint and menthol lozenges that reduce the lower esophageal sphincter tone and exacerbate the problem further in a cyclical fashion.   These are the same pts (now being labeled as having "irritable larynx syndrome" by some cough centers) who not infrequently have a history of having failed to tolerate ace inhibitors ( or lorartan for reasons that may related to vascular permability and nitric oxide pathways but not elevated  bradykinin levels (as seen with  ACEi use) losartan in the generic form has been reported now from mulitple sources  to cause a similar  pattern of non-specific  upper airway symptoms as seen with acei.)   This has not been reported with exposure to the other ARB's to date, so it seems reasonable for now to try either generic diovan or avapro if ARB needed or use an alternative class altogether.  See:  Lelon Frohlich Allergy Asthma Immunol  2008: 101: p [phone]    dry powder inhalers or biphosphonates or report having atypical/extraesophageal reflux symptoms that don't respond to standard doses of PPI  and are easily confused as having aecopd or asthma flares by even experienced allergists/ pulmonologists (myself included).   Acute respiratory failure with hypoxia [facility]) See admit July 2023 [facility] - 07/12/2022   Walked on RA   x  3  lap(s) =  approx 450  ft  @ mod pace, stopped due to end of study, min sob  with lowest 02 sats 94%   Lungs clear on exam, sats fine walking/ use neb prn saba and avoid hfa for now in fear of aggravating cough   Essential hypertension Try off losartan 07/12/2022 due to cough  - rx valsartan 160-25 one daily 07/12/2022 >>>   Advised on how to do med reconciliation at home "like a check book/balancing"   Each maintenance medication was reviewed in detail including emphasizing most importantly the difference between maintenance and prns and under what circumstances the prns are to be triggered using an action plan format where appropriate.  Total time for H and P, chart review, counseling, reviewing neb device(s) , directly observing portions of ambulatory 02 saturation study/ and generating customized AVS unique to this office visit / same day charting = > 40 min post hosp f/u ov                   Christinia Gully, MD 07/12/2022

## 2022-07-12 NOTE — Assessment & Plan Note (Signed)
See admit July 2023 [facility] - 07/12/2022   Walked on RA   x  3  lap(s) =  approx 450  ft  @ mod pace, stopped due to end of study, min sob  with lowest 02 sats 94%   Lungs clear on exam, sats fine walking/ use neb prn saba and avoid hfa for now in fear of aggravating cough

## 2022-07-14 ENCOUNTER — Telehealth: Payer: Self-pay | Admitting: Internal Medicine

## 2022-07-15 LAB — CBC WITH DIFFERENTIAL/PLATELET
Basophils Absolute: 0.1 10*3/uL (ref 0.0–0.2)
Basos: 1 %
EOS (ABSOLUTE): 0.3 10*3/uL (ref 0.0–0.4)
Eos: 3 %
Hematocrit: 40 % (ref 34.0–46.6)
Hemoglobin: 12.8 g/dL (ref 11.1–15.9)
Immature Grans (Abs): 0.2 10*3/uL — ABNORMAL HIGH (ref 0.0–0.1)
Immature Granulocytes: 1 %
Lymphocytes Absolute: 4.7 10*3/uL — ABNORMAL HIGH (ref 0.7–3.1)
Lymphs: 38 %
MCH: 26.3 pg — ABNORMAL LOW (ref 26.6–33.0)
MCHC: 32 g/dL (ref 31.5–35.7)
MCV: 82 fL (ref 79–97)
Monocytes Absolute: 0.8 10*3/uL (ref 0.1–0.9)
Monocytes: 6 %
Neutrophils Absolute: 6.2 10*3/uL (ref 1.4–7.0)
Neutrophils: 51 %
Platelets: 450 10*3/uL (ref 150–450)
RBC: 4.87 x10E6/uL (ref 3.77–5.28)
RDW: 15.4 % (ref 11.7–15.4)
WBC: 12.3 10*3/uL — ABNORMAL HIGH (ref 3.4–10.8)

## 2022-07-15 LAB — IGE: IgE (Immunoglobulin E), Serum: 105 IU/mL (ref 6–495)

## 2022-07-15 NOTE — Telephone Encounter (Signed)
Spoke with pt and reviewed Crestor instructions as documented in pt's med list. Nothing further needed at this time.

## 2022-07-25 ENCOUNTER — Ambulatory Visit: Payer: Medicare Other | Attending: Cardiology | Admitting: Cardiology

## 2022-07-25 ENCOUNTER — Encounter: Payer: Self-pay | Admitting: Cardiology

## 2022-07-25 VITALS — BP 132/80 | HR 72 | Ht 65.0 in | Wt 180.4 lb

## 2022-07-25 DIAGNOSIS — I2584 Coronary atherosclerosis due to calcified coronary lesion: Secondary | ICD-10-CM | POA: Insufficient documentation

## 2022-07-25 DIAGNOSIS — E782 Mixed hyperlipidemia: Secondary | ICD-10-CM | POA: Diagnosis not present

## 2022-07-25 DIAGNOSIS — I2510 Atherosclerotic heart disease of native coronary artery without angina pectoris: Secondary | ICD-10-CM | POA: Diagnosis not present

## 2022-07-25 DIAGNOSIS — I10 Essential (primary) hypertension: Secondary | ICD-10-CM | POA: Insufficient documentation

## 2022-07-25 MED ORDER — ROSUVASTATIN CALCIUM 5 MG PO TABS: 5.0000 mg | ORAL_TABLET | Freq: Every day | ORAL | 2 refills | Status: DC

## 2022-07-25 NOTE — Patient Instructions (Addendum)
Medication Instructions:  Your physician has recommended you make the following change in your medication:  Start crestor 5 mg daily Continue other medications the same  Labwork: none  Testing/Procedures: none  Follow-Up: Your physician recommends that you schedule a follow-up appointment in: 6 months  Any Other Special Instructions Will Be Listed Below (If Applicable).  If you need a refill on your cardiac medications before your next appointment, please call your pharmacy.

## 2022-07-25 NOTE — Progress Notes (Signed)
Cardiology Office Note:    Date:  07/25/2022   ID:  Tammy [name], DOB 12/01/1945, MRN [id number]  PCP:  Celene Squibb, MD   [facility] Providers Cardiologist:  Candee Furbish, MD     Referring MD: Celene Squibb, MD    History of Present Illness:    Tammy [name] is a 76 y.o. female with prior respiratory failure in July 2023, PVCs brief atrial tachycardia, type II MI with troponin of 2500 and respiratory failure here for follow-up.    Past Medical History:  Diagnosis Date   Asthma    Hypertension     Past Surgical History:  Procedure Laterality Date   ABDOMINAL HYSTERECTOMY  1988   CATARACT EXTRACTION W/PHACO Right 09/27/2019   Procedure: CATARACT EXTRACTION PHACO AND INTRAOCULAR LENS PLACEMENT (IOC) (CDE: 4.94);  Surgeon: Baruch Goldmann, MD;  Location: [facility];  Service: Ophthalmology;  Laterality: Right;   CATARACT EXTRACTION W/PHACO Left 10/18/2019   Procedure: CATARACT EXTRACTION PHACO AND INTRAOCULAR LENS PLACEMENT (IOC);  Surgeon: Baruch Goldmann, MD;  Location: [facility];  Service: Ophthalmology;  Laterality: Left;  CDE: 7.18   COLONOSCOPY N/A 08/11/2016   Procedure: COLONOSCOPY;  Surgeon: Rogene Houston, MD;  Location: [facility];  Service: Endoscopy;  Laterality: N/A;  730   ELBOW FRACTURE SURGERY     KNEE ARTHROSCOPY     SHOULDER OPEN ROTATOR CUFF REPAIR Right 10/06/2016   Procedure: OPEN ROTATOR CUFF REPAIR RIGHT SHOULDER;  Surgeon: Carole Civil, MD;  Location: [facility];  Service: Orthopedics;  Laterality: Right;    Current Medications: Current Meds  Medication Sig   acetaminophen-codeine (TYLENOL #3) 300-30 MG tablet Take 1 tablet by mouth every 4 (four) hours as needed for moderate pain.   chlorpheniramine (CHLOR-TRIMETON) 4 MG tablet Take 4 mg by mouth daily.   famotidine (PEPCID) 20 MG tablet One after supper   gabapentin (NEURONTIN) 100 MG capsule Take 1 capsule (100 mg total) by mouth 3 (three) times daily.   predniSONE (DELTASONE) 10 MG tablet Take   4 each am x 2 days,   2 each am x 2 days,  1 each am x 2 days and stop   rosuvastatin (CRESTOR) 5 MG tablet Take 1 tablet (5 mg total) by mouth daily.   valsartan-hydrochlorothiazide (DIOVAN HCT) 160-25 MG tablet Take 1 tablet by mouth daily.     Allergies:   Patient has no known allergies.   Social History   Socioeconomic History   Marital status: Widowed    Spouse name: Not on file   Number of children: Not on file   Years of education: Not on file   Highest education level: Not on file  Occupational History   Not on file  Tobacco Use   Smoking status: Former    Packs/day: 0.75    Years: 15.00    Total pack years: 11.25    Types: Cigarettes    Quit date: 11/28/1983    Years since quitting: 38.6   Smokeless tobacco: Never  Vaping Use   Vaping Use: Never used  Substance and Sexual Activity   Alcohol use: No   Drug use: No   Sexual activity: Not on file  Other Topics Concern   Not on file  Social History Narrative   Not on file   Social Determinants of Health   Financial Resource Strain: Not on file  Food Insecurity: Not on file  Transportation Needs: Not on file  Physical Activity: Not on file  Stress: Not on file  Social Connections: Not on file     Family History: The patient's family history includes Cancer in her maternal grandmother.  ROS:   Please see the history of present illness.     All other systems reviewed and are negative.  EKGs/Labs/Other Studies Reviewed:    The following studies were reviewed today: Echocardiogram July 2023   1. Left ventricular ejection fraction, by estimation, is 70 to 75%. The  left ventricle has hyperdynamic function. The left ventricle has no  regional wall motion abnormalities. There is moderate hypertrophy of the  basal septal segment. The rest of the LV   segments demonstrate mild left ventricular hypertrophy. Left ventricular  diastolic parameters are consistent with Grade I diastolic dysfunction  (impaired  relaxation).   2. Right ventricular systolic function is normal. The right ventricular  size is normal. There is mildly elevated pulmonary artery systolic  pressure. The estimated right ventricular systolic pressure is 07.3 mmHg.   3. The mitral valve is grossly normal. Trivial mitral valve  regurgitation.   4. The aortic valve is tricuspid. There is mild calcification of the  aortic valve. There is mild thickening of the aortic valve. Aortic valve  regurgitation is not visualized. Aortic valve sclerosis/calcification is  present, without any evidence of  aortic stenosis.   5. The inferior vena cava is dilated in size with >50% respiratory  variability, suggesting right atrial pressure of 8 mmHg.   Nuclear stress test 2018-low risk no ischemia  Chest CT-scattered mild coronary calcifications noted   Recent Labs: 06/01/2022: B Natriuretic Peptide 93.0 06/02/2022: ALT 28 06/03/2022: Magnesium 2.4 06/08/2022: BUN 29; Creatinine, Ser 1.05; Potassium 3.6; Sodium 135 07/12/2022: Hemoglobin 12.8; Platelets 450  Recent Lipid Panel    Component Value Date/Time   CHOL 177 06/03/2022 0411   TRIG 79 06/03/2022 0411   HDL 52 06/03/2022 0411   CHOLHDL 3.4 06/03/2022 0411   VLDL 16 06/03/2022 0411   LDLCALC 109 (H) 06/03/2022 0411     Risk Assessment/Calculations:              Physical Exam:    VS:  BP 132/80   Pulse 72   Ht '5\' 5"'$  (1.651 m)   Wt 180 lb 6.4 oz (81.8 kg)   SpO2 95%   BMI 30.02 kg/m     Wt Readings from Last 3 Encounters:  07/25/22 180 lb 6.4 oz (81.8 kg)  07/12/22 178 lb 12.8 oz (81.1 kg)  06/05/22 180 lb 5.4 oz (81.8 kg)     GEN:  Well nourished, well developed in no acute distress HEENT: Normal NECK: No JVD; No carotid bruits LYMPHATICS: No lymphadenopathy CARDIAC: RRR, no murmurs, no rubs, gallops RESPIRATORY: Decreased breath sounds bilaterally ABDOMEN: Soft, non-tender, non-distended MUSCULOSKELETAL:  No edema; No deformity  SKIN: Warm and  dry NEUROLOGIC:  Alert and oriented x 3 PSYCHIATRIC:  Normal affect   ASSESSMENT:    No diagnosis found. PLAN:    In order of problems listed above:  Acute respiratory failure with hypoxia - Dr. Melvyn Novas note reviewed.  Outpatient PFTs once symptoms resolve completely.  No oxygen requirement. - Echocardiogram reassuring.  Coronary artery calcifications - Personally reviewed and interpreted on chest CT.  We will go ahead and place her back on Crestor instead of 2 days a week, Crestor 5 mg daily.  Goal LDL will be less than 70.  She may need a higher dose.  Last LDL in the low 100 range.  Primary hypertension -  Medications reviewed.  Stable  Type II myocardial infarction - Troponin peaked at 2400.  Supply/demand mismatch.  In the setting of respiratory failure.  Treating with Crestor.  Prior nuclear stress test reassuring no ischemia.     Medication Adjustments/Labs and Tests Ordered: Current medicines are reviewed at length with the patient today.  Concerns regarding medicines are outlined above.  No orders of the defined types were placed in this encounter.  Meds ordered this encounter  Medications   rosuvastatin (CRESTOR) 5 MG tablet    Sig: Take 1 tablet (5 mg total) by mouth daily.    Dispense:  90 tablet    Refill:  2    07/25/2022 NEW    Patient Instructions  Medication Instructions:  Your physician has recommended you make the following change in your medication:  Start crestor 5 mg daily Continue other medications the same  Labwork: none  Testing/Procedures: none  Follow-Up: Your physician recommends that you schedule a follow-up appointment in: 6 months  Any Other Special Instructions Will Be Listed Below (If Applicable).  If you need a refill on your cardiac medications before your next appointment, please call your pharmacy.   Signed, Candee Furbish, MD  07/25/2022 3:12 PM    [place]

## 2022-07-29 ENCOUNTER — Ambulatory Visit (INDEPENDENT_AMBULATORY_CARE_PROVIDER_SITE_OTHER): Payer: Medicare Other | Admitting: Internal Medicine

## 2022-07-29 ENCOUNTER — Encounter: Payer: Self-pay | Admitting: Internal Medicine

## 2022-07-29 DIAGNOSIS — I2510 Atherosclerotic heart disease of native coronary artery without angina pectoris: Secondary | ICD-10-CM | POA: Diagnosis not present

## 2022-07-29 DIAGNOSIS — I2584 Coronary atherosclerosis due to calcified coronary lesion: Secondary | ICD-10-CM

## 2022-07-29 DIAGNOSIS — R058 Other specified cough: Secondary | ICD-10-CM | POA: Diagnosis not present

## 2022-07-29 DIAGNOSIS — I10 Essential (primary) hypertension: Secondary | ICD-10-CM

## 2022-07-29 MED ORDER — PREDNISONE 10 MG PO TABS: ORAL_TABLET | ORAL | 0 refills | Status: DC

## 2022-07-29 NOTE — Progress Notes (Signed)
Tammy [name], female    DOB: June 02, 1946    MRN: [id number]   Brief patient profile:  9  yobf  stopped smoking age 76  referred to pulmonary clinic in [place]  07/12/2022 by Dr Maryellen Pile for acute resp failure/ severe UACS  with w/u by Benjamine Mola 2019 c/w GERD dx.   Admission date:  06/01/2022  Admitting Physician  Rolla Plate, DO Discharge Date:  06/09/2022    Discharge Diagnosis  Elevated troponin [R77.8] Acute respiratory failure with hypoxia (HCC) [J96.01] Severe asthma with exacerbation, unspecified whether persistent [J45.901] Single subsegmental pulmonary embolism without acute cor pulmonale (HCC) [I26.93]     Principal Problem:   Acute respiratory failure with hypoxia (HCC)   Essential hypertension   Pulmonary Embolus RULED OUT   Leukocytosis   Hyperlipidemia   Severe asthma with exacerbation   Elevated troponin  Acute respiratory failure with hypoxia ([place]) -Patient is a reformed smoker -- VQ scan and CT chest suggest possible emphysema/COPD component given smoking history this is likely -Patient also had significant allergic rhinitis/reactive airway disease -Patient reports no prior official diagnosis of asthma in the past COPD Vs Asthma Vs reactive airway disease in a patient with significant allergic rhinitis -Patient will benefit from outpatient PFT/spirometry once acute symptoms resolve to better delineate exact diagnosis -Patient has no oxygen requirement at baseline Pulmonary consult from Dr. Halford Chessman appreciated -PE work-up essentially negative -Cardiology input appreciated -COVID-negative -Much improved respiratory status after steroids, -Hypoxia resolved, Patient has been weaned off oxygen   History of Present Illness  07/12/2022  Pulmonary/ 1st office eval/ Tammy [name] / [place] [facility] re refractory cough  Chief Complaint  Patient presents with   Hospitalization Follow-up    HFU from admission to [facility] 7/5-7/13 for chronic resp failure.  Patient has still had a  persistent cough since coming home.   Onset nasal stuffy nose /runny late 1970s allergy shots helped  but still needed allergy meds esp spring / fall and worse x 2010 rx by Dr Nevada Crane and bette typically p 2 weeks  rx nasal spray, cortisol, albuterol, short term only  This episode dates back to ? Mid June 2023  Dyspnea:  walking inside house ok  Cough: brought on by use of voice dry  hack worse since x one week p last pred 06/17/22 Sleep: pillows on flat bed and still coughs immediately at hs  SABA use: multiple inhalers not helping Rec Gabapentin 100 mg three times daily and every few days increase to 300 mg three times daily  Pantoprazole (protonix) 40 mg  (or omeprazole 20 x 2)   Take  30-60 min before first meal of the day and Pepcid (famotidine)  20 mg after supper until return to office   For drainage / throat tickle try take CHLORPHENIRAMINE  4 mg  Prednisone 10 mg take  4 each am x 2 days,   2 each am x 2 days,  1 each am x 2 days and stop  For breathing difficulty > albuterol  neb up to every 4 hours as needed Take delsym two tsp every 12 hours and supplement if needed with  Tylenol #3   up to 1-2 every 4 hours Once you have eliminated the cough for 3 straight days try reducing the Tylenol #3 first,  then the delsym as tolerated.    Valsartan 160-25 one daily in place of losartan  Please schedule a follow up office visit in 2 weeks, sooner if needed  with all medications /inhalers/  solutions in hand         07/29/2022  f/u ov/[place] office/Tammy [name] re: cough x  maint on gerd rx/ did not titrate up gabapentin, last saw Teoh  ov 09/18/18  Dx: severe LPR, worse off GERD rx so rec continue gerd rx indefinitely and f/u q 6 m (not done)  Chief Complaint  Patient presents with   Follow-up    Feels better but cough is starting to come back   Dyspnea:  not really limited by breathing if not coughing  Cough: dry/ raspy with hoarseness  Sleeping: better p h2  SABA use: none  02: none  Covid  status: all x the last      No obvious day to day or daytime variability or assoc excess/ purulent sputum or mucus plugs or hemoptysis or cp or chest tightness, subjective wheeze or overt sinus or hb symptoms.   Sleeping fine now  without nocturnal  or early am exacerbation  of respiratory  c/o's or need for noct saba. Also denies any obvious fluctuation of symptoms with weather or environmental changes or other aggravating or alleviating factors except as outlined above   No unusual exposure hx or h/o childhood pna/ asthma or knowledge of premature birth.  Current Allergies, Complete Past Medical History, Past Surgical History, Family History, and Social History were reviewed in [employer] record.  ROS  The following are not active complaints unless bolded Hoarseness, sore throat, dysphagia, dental problems, itching, sneezing,  nasal congestion or discharge of excess mucus or purulent secretions, ear ache,   fever, chills, sweats, unintended wt loss or wt gain, classically pleuritic or exertional cp,  orthopnea pnd or arm/hand swelling  or leg swelling, presyncope, palpitations, abdominal pain, anorexia, nausea, vomiting, diarrhea  or change in bowel habits or change in bladder habits, change in stools or change in urine, dysuria, hematuria,  rash, arthralgias, visual complaints, headache, numbness, weakness or ataxia or problems with walking or coordination,  change in mood or  memory.        Current Meds  Medication Sig   acetaminophen-codeine (TYLENOL #3) 300-30 MG tablet Take 1 tablet by mouth every 4 (four) hours as needed for moderate pain.   albuterol (PROVENTIL) (2.5 MG/3ML) 0.083% nebulizer solution Take 3 mLs (2.5 mg total) by nebulization every 4 (four) hours as needed for wheezing or shortness of breath.   chlorpheniramine (CHLOR-TRIMETON) 4 MG tablet Take 4 mg by mouth daily.   famotidine (PEPCID) 20 MG tablet One after supper   gabapentin (NEURONTIN) 100  MG capsule Take 1 capsule (100 mg total) by mouth 3 (three) times daily.   pantoprazole (PROTONIX) 40 MG tablet Take 40 mg by mouth every morning.   rosuvastatin (CRESTOR) 5 MG tablet Take 1 tablet (5 mg total) by mouth daily.   valsartan-hydrochlorothiazide (DIOVAN HCT) 160-25 MG tablet Take 1 tablet by mouth daily.        Past Medical History:  Diagnosis Date   Asthma    Hypertension         Objective:    Wt Readings from Last 3 Encounters:  07/29/22 182 lb (82.6 kg)  07/25/22 180 lb 6.4 oz (81.8 kg)  07/12/22 178 lb 12.8 oz (81.1 kg)      Vital signs reviewed  07/29/2022  - Note at rest 02 sats  98% on RA   General appearance:    amb very hoarse bf nad   HEENT : Oropharynx  clear  Nasal turbinates mild non-specific edema bilaterally    NECK :  without  apparent JVD/ palpable Nodes/TM    LUNGS: no acc muscle use,  Nl contour chest which is clear to A and P bilaterally without cough on insp or exp maneuvers   CV:  RRR  no s3 or murmur or increase in P2, and no edema   ABD:  soft and nontender with nl inspiratory excursion in the supine position. No bruits or organomegaly appreciated   MS:  Nl gait/ ext warm without deformities Or obvious joint restrictions  calf tenderness, cyanosis or clubbing    SKIN: warm and dry without lesions    NEURO:  alert, approp, nl sensorium with  no motor or cerebellar deficits apparent.          Assessment

## 2022-07-29 NOTE — Assessment & Plan Note (Addendum)
Try off losartan 07/12/2022 due to cough  - rx valsartan 160-25 one daily 07/12/2022 >>> improved some ? If related to change   Although even in retrospect it may not be clear the losartan contributed to the pt's symptoms,    adding it  back at this point or in the future would risk confusion in interpretation of non-specific respiratory symptoms to which this patient is prone  ie  Better not to muddy the waters here esp with good bp control on diovan 160-25 one daily > f/u PCP          Each maintenance medication was reviewed in detail including emphasizing most importantly the difference between maintenance and prns and under what circumstances the prns are to be triggered using an action plan format where appropriate.  Total time for H and P, chart review, counseling,   and generating customized AVS unique to this office visit / same day charting > 30 min for   refractory respiratory  symptoms of uncertain etiology

## 2022-07-29 NOTE — Assessment & Plan Note (Addendum)
Onset was June 2023 but original cough was 2005 onset of allergy symptoms 1980s took shots x 6 y  helped  Teoh eval  09/18/18  Dx: severe LPR, worse off GERD rx so rec continue gerd rx indefinitely and f/u q 6 m (not done)  - Allergy screen 07/12/2022 >  Eos 0.3  /  IgE  105 - 07/12/2022 started gabapentin titrate to max of 100 mg x 3 tid / max gerd rx and 1st gen H1 blockers per guidelines  Hs and f/u q 2 weeks/ off losartan  - 07/29/2022 transiently improved but recurred w/in a week of d/c pred x 6 so rec pred x 8 days, titrate gabapentin to max of 300 tid and referred for  allergy eval    Her lungs are perfectly clear and she no longer has any noct cough at all p h2 and 1st gen H1 blockers per guidelines  > ok to increase the latter daytime as tolerated and work to find best dose of gapapentin while continuing max gerd rx >>> also so added 8 day taper off  Prednisone starting at 40 mg per day in case of component of Th-2 driven upper or lower airways inflammation (if cough responds short term only to relapse before return while will on full rx for uacs (as above), then  that would point to allergic rhinitis/ asthma or eos bronchitis as alternative dx)   >>> f/u in 4 weeks with all meds in hand using a trust but verify approach to confirm accurate Medication  Reconciliation The principal here is that until we are certain that the  patients are doing what we've asked, it makes no sense to ask them to do more.

## 2022-07-29 NOTE — Patient Instructions (Addendum)
Prednisone Take 4 for two days three for two days two for two days one for two days   Gabapentin 100 mg three times daily and every few days increase by 100 mg until cough is 100% maximum dose 300 mg three times daily   Pantoprazole (protonix) 40 mg  (or omeprazole 20 x 2)   Take  30-60 min before first meal of the day and Pepcid (famotidine)  20 mg after supper until return to office - this is the best way to tell whether stomach acid is contributing to your problem.    For drainage / throat tickle try take CHLORPHENIRAMINE  4 mg  ("Allergy Relief" '4mg'$   at [facility] should be easiest to find in the blue box usually on bottom shelf)  take one every 4 hours as needed - extremely effective and inexpensive over the counter- may cause drowsiness so start with just a dose or two an hour before bedtime and see how you tolerate it before trying in daytime.    For breathing difficulty > albuterol  neb up to every 4 hours as needed  Take delsym two tsp every 12 hours and supplement if needed with  Tylenol #3   up to 1-2 every 4 hours to suppress the urge to cough. Swallowing water and/or using ice chips/non mint and menthol containing candies (such as lifesavers or sugarless jolly ranchers) are also effective.  You should rest your voice and avoid activities that you know make you cough.  Once you have eliminated the cough for 3 straight days try reducing the Tylenol #3 first,  then the delsym as tolerated.     My office will be contacting you by phone for referral to allergy in [place]   - if you don't hear back from my office within one week please call us back or notify us thru MyChart and we'll address it right away.    Please schedule a follow up office visit in 4 weeks, call sooner if needed with all medications /inhalers/ solutions in hand so we can verify exactly what you are taking. This includes all medications from all doctors and over the [place] separate them into two  bags:  the ones you take automatically, no matter what, vs the ones you take just when you feel you need them "BAG #2 is UP TO YOU"  - this will really help [place] help you take your medications more effectively.

## 2022-08-02 ENCOUNTER — Other Ambulatory Visit: Payer: Self-pay | Admitting: Internal Medicine

## 2022-08-02 ENCOUNTER — Telehealth: Payer: Self-pay | Admitting: Internal Medicine

## 2022-08-02 NOTE — Telephone Encounter (Signed)
Called and spoke to patient, she received a mychart message about a referral and wanted the number for Allergy and Asthma where we referred her to. Gave her their number according to google [phone]. Nothing further needed at this time.

## 2022-08-05 ENCOUNTER — Telehealth: Payer: Self-pay | Admitting: Internal Medicine

## 2022-08-05 NOTE — Telephone Encounter (Signed)
Called [facility] and spoke to [place]. She needed to verify instructions and quantity of tablets. Verbally permitted Stephanie to change quantity to 20 tablets since patient was instructed to take 4 tabs for 2 days, 3 tabs for 2 days, 2 tabs for 2 days and 1 tab for 2 days= 20 tablets  ATC patient to notify that medication was verified by our office so should be sent to her soon. No answer. Nothing further needed at this time.

## 2022-08-05 NOTE — Telephone Encounter (Signed)
Pt was calling about prednisone.  See previous message from Howard.  Pt phone:  [phone]

## 2022-08-08 ENCOUNTER — Telehealth: Payer: Self-pay | Admitting: Internal Medicine

## 2022-08-08 MED ORDER — PREDNISONE 10 MG PO TABS: ORAL_TABLET | ORAL | 0 refills | Status: AC

## 2022-08-08 NOTE — Telephone Encounter (Signed)
Called and notified patient.Prednisone reordered for [facility] nothing further needed

## 2022-08-08 NOTE — Telephone Encounter (Signed)
Called and spoke to patient. She states she is still coughing but cannot cough any sputum up. She is stopped up and has congestion in chest. No fever. Has some SOB but states it comes and goes. She has been taking albuterol treatments as directed at last ov with Dr. Melvyn Novas.  Patient was prescribed prednisone by Dr. Melvyn Novas but mail order pharmacy has not shipped it yet. Patient is asking if we can send in the order to [facility] and is asking if there is anything else she should take.

## 2022-08-08 NOTE — Telephone Encounter (Signed)
Prednisone Take 4 for three days 3 for three days 2 for three days 1 for three days and stop

## 2022-08-26 ENCOUNTER — Ambulatory Visit (INDEPENDENT_AMBULATORY_CARE_PROVIDER_SITE_OTHER): Payer: Medicare Other | Admitting: Internal Medicine

## 2022-08-26 ENCOUNTER — Encounter: Payer: Self-pay | Admitting: Internal Medicine

## 2022-08-26 DIAGNOSIS — R058 Other specified cough: Secondary | ICD-10-CM | POA: Diagnosis not present

## 2022-08-26 DIAGNOSIS — I2510 Atherosclerotic heart disease of native coronary artery without angina pectoris: Secondary | ICD-10-CM | POA: Diagnosis not present

## 2022-08-26 DIAGNOSIS — I2584 Coronary atherosclerosis due to calcified coronary lesion: Secondary | ICD-10-CM | POA: Diagnosis not present

## 2022-08-26 MED ORDER — GABAPENTIN 300 MG PO CAPS: 300.0000 mg | ORAL_CAPSULE | Freq: Two times a day (BID) | ORAL | 5 refills | Status: DC

## 2022-08-26 NOTE — Progress Notes (Signed)
Tammy [name], female    DOB: June 02, 1946    MRN: [id number]   Brief patient profile:  9  yobf  stopped smoking age 76  referred to pulmonary clinic in [place]  07/12/2022 by Dr Maryellen Pile for acute resp failure/ severe UACS  with w/u by Benjamine Mola 2019 c/w GERD dx.   Admission date:  06/01/2022  Admitting Physician  Rolla Plate, DO Discharge Date:  06/09/2022    Discharge Diagnosis  Elevated troponin [R77.8] Acute respiratory failure with hypoxia (HCC) [J96.01] Severe asthma with exacerbation, unspecified whether persistent [J45.901] Single subsegmental pulmonary embolism without acute cor pulmonale (HCC) [I26.93]     Principal Problem:   Acute respiratory failure with hypoxia (HCC)   Essential hypertension   Pulmonary Embolus RULED OUT   Leukocytosis   Hyperlipidemia   Severe asthma with exacerbation   Elevated troponin  Acute respiratory failure with hypoxia ([place]) -Patient is a reformed smoker -- VQ scan and CT chest suggest possible emphysema/COPD component given smoking history this is likely -Patient also had significant allergic rhinitis/reactive airway disease -Patient reports no prior official diagnosis of asthma in the past COPD Vs Asthma Vs reactive airway disease in a patient with significant allergic rhinitis -Patient will benefit from outpatient PFT/spirometry once acute symptoms resolve to better delineate exact diagnosis -Patient has no oxygen requirement at baseline Pulmonary consult from Dr. Halford Chessman appreciated -PE work-up essentially negative -Cardiology input appreciated -COVID-negative -Much improved respiratory status after steroids, -Hypoxia resolved, Patient has been weaned off oxygen   History of Present Illness  07/12/2022  Pulmonary/ 1st office eval/ [name] / [place] [facility] re refractory cough  Chief Complaint  Patient presents with   Hospitalization Follow-up    HFU from admission to [facility] 7/5-7/13 for chronic resp failure.  Patient has still had a  persistent cough since coming home.   Onset nasal stuffy nose /runny late 1970s allergy shots helped  but still needed allergy meds esp spring / fall and worse x 2010 rx by Dr Nevada Crane and bette typically p 2 weeks  rx nasal spray, cortisol, albuterol, short term only  This episode dates back to ? Mid June 2023  Dyspnea:  walking inside house ok  Cough: brought on by use of voice dry  hack worse since x one week p last pred 06/17/22 Sleep: pillows on flat bed and still coughs immediately at hs  SABA use: multiple inhalers not helping Rec Gabapentin 100 mg three times daily and every few days increase to 300 mg three times daily  Pantoprazole (protonix) 40 mg  (or omeprazole 20 x 2)   Take  30-60 min before first meal of the day and Pepcid (famotidine)  20 mg after supper until return to office   For drainage / throat tickle try take CHLORPHENIRAMINE  4 mg  Prednisone 10 mg take  4 each am x 2 days,   2 each am x 2 days,  1 each am x 2 days and stop  For breathing difficulty > albuterol  neb up to every 4 hours as needed Take delsym two tsp every 12 hours and supplement if needed with  Tylenol #3   up to 1-2 every 4 hours Once you have eliminated the cough for 3 straight days try reducing the Tylenol #3 first,  then the delsym as tolerated.    Valsartan 160-25 one daily in place of losartan  Please schedule a follow up office visit in 2 weeks, sooner if needed  with all medications /inhalers/  solutions in hand         07/29/2022  f/u ov/[name] office/Tyton Abdallah re: cough x  maint on gerd rx/ did not titrate up gabapentin, last saw Teoh  ov 09/18/18  Dx: severe LPR, worse off GERD rx so rec continue gerd rx indefinitely and f/u q 6 m (not done)  Chief Complaint  Patient presents with   Follow-up    Feels better but cough is starting to come back   Dyspnea:  not really limited by breathing if not coughing  Cough: dry/ raspy with hoarseness  Sleeping: better p h2  SABA use: none  02: none  Covid  status: all x the last  Rec Prednisone Take 4 for two days three for two days two for two days one for two days  Gabapentin 100 mg three times daily and every few days increase by 100 mg until cough is 100% maximum dose 300 mg three times daily  Pantoprazole (protonix) 40 mg  (or omeprazole 20 x 2)   Take  30-60 min before first meal of the day and Pepcid (famotidine)  20 mg after supper until return to office -  For drainage / throat tickle try take CHLORPHENIRAMINE  4 mg    For breathing difficulty > albuterol  neb up to every 4 hours as needed Take delsym two tsp every 12 hours and supplement if needed with  Tylenol #3   up to 1-2 every 4 hour Once you have eliminated the cough for 3 straight days try reducing the Tylenol #3 first,  then the delsym as tolerated.     referral to allergy in [place]  > pending as of 08/26/2022    Please schedule a follow up office visit in 4 weeks, call sooner if needed with all medications /inhalers/ solutions in hand   08/26/2022  f/u ov/[place] office/[name] re: cough x  1970s/ worse since 2010 and much worse June 2023 maint on gerd rx and gabapentin 400 mg   Chief Complaint  Patient presents with   Follow-up    Cough has improved since last ov but still present    Dyspnea:  Not limited by breathing from desired activities  - [place]  Cough: sporadic  Sleeping: ok p 1st gen H1 blockers per guidelines  x 4 mg daily  SABA use: none now  02: none      No obvious day to day or daytime variability or assoc excess/ purulent sputum or mucus plugs or hemoptysis or cp or chest tightness, subjective wheeze or overt  hb symptoms.   sleeping without nocturnal  or early am exacerbation  of respiratory  c/o's or need for noct saba. Also denies any obvious fluctuation of symptoms with weather or environmental changes or other aggravating or alleviating factors except as outlined above   No unusual exposure hx or h/o childhood pna/ asthma or  knowledge of premature birth.  Current Allergies, Complete Past Medical History, Past Surgical History, Family History, and Social History were reviewed in [employer] record.  ROS  The following are not active complaints unless bolded Hoarseness, sore throat, dysphagia, dental problems, itching, sneezing,  nasal congestion or discharge of excess mucus or purulent secretions, ear ache,   fever, chills, sweats, unintended wt loss or wt gain, classically pleuritic or exertional cp,  orthopnea pnd or arm/hand swelling  or leg swelling, presyncope, palpitations, abdominal pain, anorexia, nausea, vomiting, diarrhea  or change in bowel habits or change in bladder  habits, change in stools or change in urine, dysuria, hematuria,  rash, arthralgias, visual complaints, headache, numbness, weakness or ataxia or problems with walking or coordination,  change in mood or  memory.        Current Meds  Medication Sig   acetaminophen-codeine (TYLENOL #3) 300-30 MG tablet Take 1 tablet by mouth every 4 (four) hours as needed for moderate pain.   albuterol (PROVENTIL) (2.5 MG/3ML) 0.083% nebulizer solution Take 3 mLs (2.5 mg total) by nebulization every 4 (four) hours as needed for wheezing or shortness of breath.   chlorpheniramine (CHLOR-TRIMETON) 4 MG tablet Take 4 mg by mouth daily.   famotidine (PEPCID) 20 MG tablet One after supper   pantoprazole (PROTONIX) 40 MG tablet TAKE 1 TABLET (40 MG TOTAL) BY MOUTH DAILY. TAKE 30-60 MIN BEFORE FIRST MEAL OF THE DAY   rosuvastatin (CRESTOR) 5 MG tablet Take 1 tablet (5 mg total) by mouth daily.   valsartan-hydrochlorothiazide (DIOVAN HCT) 160-25 MG tablet Take 1 tablet by mouth daily.               Past Medical History:  Diagnosis Date   Asthma    Hypertension         Objective:    08/26/2022        185   07/29/22 182 lb (82.6 kg)  07/25/22 180 lb 6.4 oz (81.8 kg)  07/12/22 178 lb 12.8 oz (81.1 kg)    Vital signs reviewed   08/26/2022  - Note at rest 02 sats  98% on RA   General appearance:    amb bf nad still occ throat clearing / occ sniffles / voice fatigue    HEENT : Oropharynx  clear     Nasal turbinates nl    NECK :  without  apparent JVD/ palpable Nodes/TM    LUNGS: no acc muscle use,  Nl contour chest which is clear to A and P bilaterally without cough on insp or exp maneuvers   CV:  RRR  no s3 or murmur or increase in P2, and no edema   ABD:  soft and nontender with nl inspiratory excursion in the supine position. No bruits or organomegaly appreciated   MS:  Nl gait/ ext warm without deformities Or obvious joint restrictions  calf tenderness, cyanosis or clubbing    SKIN: warm and dry without lesions    NEURO:  alert, approp, nl sensorium with  no motor or cerebellar deficits apparent.                Assessment

## 2022-08-26 NOTE — Patient Instructions (Signed)
Increase gabapentin to 300 mg twice daily   Keep appt with allergy   Please schedule a follow up visit in 3 months but call sooner if needed (after the holidays is fine)

## 2022-08-26 NOTE — Assessment & Plan Note (Signed)
Onset was June 2023 but original cough was 2005 onset of allergy symptoms 1980s took shots x 6 y  helped  Teoh eval  09/18/18  Dx: severe LPR, worse off GERD rx so rec continue gerd rx indefinitely and f/u q 6 m (not done)  - Allergy screen 07/12/2022 >  Eos 0.3  /  IgE  105 - 07/12/2022 started gabapentin titrate to max of 100 mg x 3 tid / max gerd rx and 1st gen H1 blockers per guidelines  Hs and f/u q 2 weeks/ off losartan  - 07/29/2022 transiently improved but recurred w/in a week of d/c pred x 6 so rec pred x 8 days, titrate gabapentin to max of 300 tid and allergy eval > referred  - 08/26/2022 increased gabapentin to 300 mg bid and f/u allergy  Finally getting a handle on cyclical cough but has ongoing sniffles "just like this time every year"  So rec titrate up gabapentin to 300 mg bid and also  1st gen H1 blockers per guidelines    No evidence asthma component so f/u here in 3-4 months, sooner if needed         Each maintenance medication was reviewed in detail including emphasizing most importantly the difference between maintenance and prns and under what circumstances the prns are to be triggered using an action plan format where appropriate.  Total time for H and P, chart review, counseling, and generating customized AVS unique to this office visit / same day charting = 25 min

## 2022-08-30 DIAGNOSIS — J4551 Severe persistent asthma with (acute) exacerbation: Secondary | ICD-10-CM | POA: Diagnosis not present

## 2022-08-30 DIAGNOSIS — I10 Essential (primary) hypertension: Secondary | ICD-10-CM | POA: Diagnosis not present

## 2022-08-30 DIAGNOSIS — E782 Mixed hyperlipidemia: Secondary | ICD-10-CM | POA: Diagnosis not present

## 2022-09-07 ENCOUNTER — Encounter: Payer: Self-pay | Admitting: Allergy & Immunology

## 2022-09-07 ENCOUNTER — Ambulatory Visit (INDEPENDENT_AMBULATORY_CARE_PROVIDER_SITE_OTHER): Payer: Medicare Other | Admitting: Allergy & Immunology

## 2022-09-07 VITALS — BP 130/82 | HR 89 | Temp 98.0°F | Resp 16 | Ht 62.21 in | Wt 187.8 lb

## 2022-09-07 DIAGNOSIS — I2584 Coronary atherosclerosis due to calcified coronary lesion: Secondary | ICD-10-CM | POA: Diagnosis not present

## 2022-09-07 DIAGNOSIS — R053 Chronic cough: Secondary | ICD-10-CM

## 2022-09-07 DIAGNOSIS — J3089 Other allergic rhinitis: Secondary | ICD-10-CM

## 2022-09-07 DIAGNOSIS — I2510 Atherosclerotic heart disease of native coronary artery without angina pectoris: Secondary | ICD-10-CM

## 2022-09-07 DIAGNOSIS — K219 Gastro-esophageal reflux disease without esophagitis: Secondary | ICD-10-CM | POA: Diagnosis not present

## 2022-09-07 DIAGNOSIS — J310 Chronic rhinitis: Secondary | ICD-10-CM

## 2022-09-07 MED ORDER — IPRATROPIUM BROMIDE 0.03 % NA SOLN: 2.0000 | Freq: Three times a day (TID) | NASAL | 5 refills | Status: DC

## 2022-09-07 NOTE — Patient Instructions (Signed)
1. Chronic cough - Lung testing looked BEAUTIFUL today. - Continue with your plan per Dr. Melvyn Novas.   2. Chronic rhinitis - Testing today showed: trees. - Copy of test results provided.  - Avoidance measures provided. - We are going to get blood work to confirm this. - Medications to help dry up postnasal drip could still be helpful.  - Continue with: Chlortab per Dr. Melvyn Novas - Start taking: Atrovent (ipratropium) 0.03% one spray per nostril 2-3 times daily as needed (CAN BE OVER DRYING) - You can use an extra dose of the antihistamine, if needed, for breakthrough symptoms.  - Consider nasal saline rinses 1-2 times daily to remove allergens from the nasal cavities as well as help with mucous clearance (this is especially helpful to do before the nasal sprays are given)  3. Gastroesophageal reflux disease, unspecified whether esophagitis present - Continue with your reflux medications that you are on right now. - Consider going to see GI for an endoscopy if the cough persists.   4. No follow-ups on file.    Please inform us of any Emergency Department visits, hospitalizations, or changes in symptoms. Call us before going to the ED for breathing or allergy symptoms since we might be able to fit you in for a sick visit. Feel free to contact us anytime with any questions, problems, or concerns.  It was a pleasure to meet you today!  Websites that have reliable patient information: 1. American Academy of Asthma, Allergy, and Immunology: www.aaaai.org 2. Food Allergy Research and Education (FARE): foodallergy.org 3. Mothers of Asthmatics: http://www.asthmacommunitynetwork.org 4. American College of Allergy, Asthma, and Immunology: www.acaai.org   COVID-19 Vaccine Information can be found at: [email or web address] For questions related to vaccine distribution or appointments, please email [email or web address] or call [phone].   We  realize that you might be concerned about having an allergic reaction to the COVID19 vaccines. To help with that concern, WE ARE OFFERING THE COVID19 VACCINES IN OUR OFFICE! Ask the front desk for dates!     "Like" [place] on Facebook and Instagram for our latest updates!      A healthy democracy works best when [employer] participate! Make sure you are registered to vote! If you have moved or changed any of your contact information, you will need to get this updated before voting!  In some cases, you MAY be able to register to vote online: [email or web address]     Airborne Adult Perc - 09/07/22 1033     Time Antigen Placed [place] Lavella Hammock    Location Back    Number of Test 59    1. Control-Buffer 50% Glycerol Negative    2. Control-Histamine 1 mg/ml 2+    3. Albumin saline Negative    4. Earth Negative    5. [place] Negative    6. Johnson Negative    7. [place] Blue Negative    8. Meadow Fescue Negative    9. Perennial Rye Negative    10. Sweet Vernal Negative    11. Timothy Negative    12. Cocklebur Negative    13. Burweed Marshelder Negative    14. Ragweed, short Negative    15. Ragweed, Giant Negative    16. Plantain,  English Negative    17. Lamb's Quarters Negative    18. Sheep Sorrell Negative    19. Rough Pigweed Negative    20. Marsh Elder, Rough Negative    21. Mugwort, Common Negative    22. Ash mix Negative  23Wendee Copp mix Negative    24. Beech American Negative    25. Box, Elder Negative    26. Cedar, red Negative    27. Cottonwood, [place] Negative    28. Elm mix Negative    29. Hickory Negative    30. Maple mix 3+    31. Oak, [place] mix Negative    32. Pecan Pollen Negative    33. Pine mix Negative    34. Sycamore Eastern Negative    35. [place], Black Pollen 2+    36. Alternaria alternata Negative    37. Cladosporium Herbarum Negative    38. Aspergillus mix Negative    39. Penicillium mix Negative     40. Bipolaris sorokiniana (Helminthosporium) Negative    41. Drechslera spicifera (Curvularia) Negative    42. Mucor plumbeus Negative    43. Fusarium moniliforme Negative    44. Aureobasidium pullulans (pullulara) Negative    45. Rhizopus oryzae Negative    46. Botrytis cinera Negative    47. Epicoccum nigrum Negative    48. Phoma betae Negative    49. Candida Albicans Negative    50. Trichophyton mentagrophytes Negative    51. Mite, D Farinae  5,000 AU/ml Negative    52. Mite, D Pteronyssinus  5,000 AU/ml Negative    53. Cat Hair 10,000 BAU/ml Negative    54.  Dog Epithelia Negative    55. Mixed Feathers Negative    56. Horse Epithelia Negative    57. Cockroach, German Negative    58. Mouse Negative    59. Tobacco Leaf Negative             Intradermal - 09/07/22 1100     Time Antigen Placed 1045    Allergen Manufacturer Greer    Location Arm    Number of Test 14    Control Negative    [place] Negative    Johnson Negative    7 Grass Negative    Ragweed mix Negative    Weed mix Negative    Mold 1 Negative    Mold 2 Negative    Mold 3 Negative    Mold 4 Negative    Cat Negative    Dog Negative    Cockroach Negative    Mite mix Negative             Reducing Pollen Exposure  The American Academy of Allergy, Asthma and Immunology suggests the following steps to reduce your exposure to pollen during allergy seasons.    Do not hang sheets or clothing out to dry; pollen may collect on these items. Do not mow lawns or spend time around freshly cut grass; mowing stirs up pollen. Keep windows closed at night.  Keep car windows closed while driving. Minimize morning activities outdoors, a time when pollen counts are usually at their highest. Stay indoors as much as possible when pollen counts or humidity is high and on windy days when pollen tends to remain in the air longer. Use air conditioning when possible.  Many air conditioners have filters that trap the  pollen spores. Use a HEPA room air filter to remove pollen form the indoor air you breathe.

## 2022-09-07 NOTE — Progress Notes (Signed)
NEW PATIENT  Date of Service/Encounter:  09/07/22  Consult requested by: Celene Squibb, MD   Assessment:   Chronic cough - doubt allergic trigger  Mixed rhinitis - with sensitization to trees, but clearly this is more than just a reaction to tree pollen given the time of the year that it occurs  Gastroesophageal reflux disease - on famotidine and pantoprazole  Plan/Recommendations:   1. Chronic cough - Lung testing looked BEAUTIFUL today. - Continue with your plan per Dr. Melvyn Novas.   2. Chronic rhinitis - Testing today showed: trees. - Copy of test results provided.  - Avoidance measures provided. - We are going to get blood work to confirm this. - Medications to help dry up postnasal drip could still be helpful.  - Continue with: Chlortab per Dr. Melvyn Novas - Start taking: Atrovent (ipratropium) 0.03% one spray per nostril 2-3 times daily as needed (CAN BE OVER DRYING) - You can use an extra dose of the antihistamine, if needed, for breakthrough symptoms.  - Consider nasal saline rinses 1-2 times daily to remove allergens from the nasal cavities as well as help with mucous clearance (this is especially helpful to do before the nasal sprays are given)  3. Gastroesophageal reflux disease - Continue with your reflux medications that you are on right now. - Consider going to see GI for an endoscopy if the cough persists.   4. Follow up in 3 months or earlier if needed.    This note in its entirety was forwarded to the Provider who requested this consultation.  Subjective:   Tammy [name] is a 76 y.o. female presenting today for evaluation of  Chief Complaint  Patient presents with   Cough    Says she has seen pulmonology and now here as she has issues with a cough and voice hoarseness.    Allergic Rhinitis     States that in 1976 she was administering home allergy injections daily for ten years.     Tammy [name] has a history of the following: Patient Active Problem List    Diagnosis Date Noted   Coronary artery calcification 07/25/2022   Upper airway cough syndrome 07/12/2022   Acute respiratory failure with hypoxia ([place]) 06/01/2022   Essential hypertension 06/01/2022   Pulmonary Embolus RULED OUT 06/01/2022   Leukocytosis 06/01/2022   Hyperlipidemia 06/01/2022   Severe asthma with exacerbation 06/01/2022   Elevated troponin 06/01/2022   S/P right rotator cuff repair 10/06/2016   Complete tear of left rotator cuff     History obtained from: chart review and patient.  Tammy [name] was referred by Celene Squibb, MD.     Tammy [name] is a 76 y.o. female presenting for an evaluation of difficulty breathing . She reports that she normally has some allergies and she goes to the PCP. She will have steroids and it gets better.   In July, she had a particularly terrible bout. She was in the hospital for 5 days. Then she went to see Dr. Melvyn Novas and she was referred here for further workup, in particular to see whether this is related to allergies at all. Dr. Melvyn Novas thinks that this is likely GERD.    She can be doing just fine. And then it just gets worse immediately. She is currently on the nebulizer with albuterol just as needed. Currently she is doing this twice daily. She is also on gabapentin BID as well as famotidine and pantoprazole. She has prednisone to use as needed for  flares. She has been given chlorpheniramine. She has Tessalon pearls for the cough. Her gabapentin was recently increased at the end of September to '300mg'$  BID. She has seen ENT in the past. She was a smoker for two years from age 80 through age 35.   Allergic Rhinitis Symptom History: She has bene on allergy shots in the late 1970s and she was on them for ten years total. This seemed to help.  Currently she does have some drainage down her throat and she has some swallowing difficulties.   Otherwise, there is no history of other atopic diseases, including food allergies, drug allergies, stinging  insect allergies, eczema, urticaria, or contact dermatitis. There is no significant infectious history. Vaccinations are up to date.    Past Medical History: Patient Active Problem List   Diagnosis Date Noted   Coronary artery calcification 07/25/2022   Upper airway cough syndrome 07/12/2022   Acute respiratory failure with hypoxia ([place]) 06/01/2022   Essential hypertension 06/01/2022   Pulmonary Embolus RULED OUT 06/01/2022   Leukocytosis 06/01/2022   Hyperlipidemia 06/01/2022   Severe asthma with exacerbation 06/01/2022   Elevated troponin 06/01/2022   S/P right rotator cuff repair 10/06/2016   Complete tear of left rotator cuff     Medication List:  Allergies as of 09/07/2022   No Known Allergies      Medication List        Accurate as of September 07, 2022  1:29 PM. If you have any questions, ask your nurse or doctor.          STOP taking these medications    acetaminophen-codeine 300-30 MG tablet Commonly known as: TYLENOL #3 Stopped by: Valentina Shaggy, MD       TAKE these medications    albuterol (2.5 MG/3ML) 0.083% nebulizer solution Commonly known as: PROVENTIL Take 3 mLs (2.5 mg total) by nebulization every 4 (four) hours as needed for wheezing or shortness of breath.   benzonatate 100 MG capsule Commonly known as: TESSALON Take 100 mg by mouth 3 (three) times daily.   chlorpheniramine 4 MG tablet Commonly known as: CHLOR-TRIMETON Take 4 mg by mouth daily.   famotidine 20 MG tablet Commonly known as: Pepcid One after supper   gabapentin 300 MG capsule Commonly known as: Neurontin Take 1 capsule (300 mg total) by mouth 2 (two) times daily.   ipratropium 0.03 % nasal spray Commonly known as: ATROVENT Place 2 sprays into both nostrils 3 (three) times daily. Started by: Valentina Shaggy, MD   pantoprazole 40 MG tablet Commonly known as: PROTONIX TAKE 1 TABLET (40 MG TOTAL) BY MOUTH DAILY. TAKE 30-60 MIN BEFORE FIRST MEAL OF THE  DAY   rosuvastatin 5 MG tablet Commonly known as: CRESTOR Take 1 tablet (5 mg total) by mouth daily.   valsartan-hydrochlorothiazide 160-25 MG tablet Commonly known as: Diovan HCT Take 1 tablet by mouth daily.        Birth History: non-contributory  Developmental History: non-contributory  Past Surgical History: Past Surgical History:  Procedure Laterality Date   ABDOMINAL HYSTERECTOMY  1988   CATARACT EXTRACTION W/PHACO Right 09/27/2019   Procedure: CATARACT EXTRACTION PHACO AND INTRAOCULAR LENS PLACEMENT (IOC) (CDE: 4.94);  Surgeon: Baruch Goldmann, MD;  Location: [facility];  Service: Ophthalmology;  Laterality: Right;   CATARACT EXTRACTION W/PHACO Left 10/18/2019   Procedure: CATARACT EXTRACTION PHACO AND INTRAOCULAR LENS PLACEMENT (IOC);  Surgeon: Baruch Goldmann, MD;  Location: [facility];  Service: Ophthalmology;  Laterality: Left;  CDE: 7.18  COLONOSCOPY N/A 08/11/2016   Procedure: COLONOSCOPY;  Surgeon: Rogene Houston, MD;  Location: [facility];  Service: Endoscopy;  Laterality: N/A;  730   ELBOW FRACTURE SURGERY     KNEE ARTHROSCOPY     SHOULDER OPEN ROTATOR CUFF REPAIR Right 10/06/2016   Procedure: OPEN ROTATOR CUFF REPAIR RIGHT SHOULDER;  Surgeon: Carole Civil, MD;  Location: [facility];  Service: Orthopedics;  Laterality: Right;     Family History: Family History  Problem Relation Age of Onset   Eczema Father    Asthma Father    Cancer Maternal Grandmother      Social History: Lanyah lives at home with her husband.  They live in a house that is around 78 years old.  There is carpeting throughout the home.  They have gas heating and central cooling.  There are no animals inside or outside of the home.  There are dust mite covers on the bedding.  There is no tobacco exposure.  She is retired.  They have 2 sons.    Review of Systems  Constitutional: Negative.  Negative for fever, malaise/fatigue and weight loss.  HENT: Negative.  Negative for congestion, ear  discharge and ear pain.   Eyes:  Negative for pain, discharge and redness.  Respiratory:  Positive for cough. Negative for sputum production, shortness of breath and wheezing.   Cardiovascular: Negative.  Negative for chest pain and palpitations.  Gastrointestinal:  Negative for abdominal pain, heartburn, nausea and vomiting.  Skin: Negative.  Negative for itching and rash.  Neurological:  Negative for dizziness and headaches.  Endo/Heme/Allergies:  Positive for environmental allergies. Does not bruise/bleed easily.       Objective:   Blood pressure 130/82, pulse 89, temperature 98 F (36.7 C), temperature source Temporal, resp. rate 16, height 5' 2.21" (1.58 m), weight 187 lb 12.8 oz (85.2 kg), SpO2 98 %. Body mass index is 34.12 kg/m.     Physical Exam Vitals reviewed.  Constitutional:      Appearance: She is well-developed.     Comments: Hoarseness.   HENT:     Head: Normocephalic and atraumatic.     Right Ear: Tympanic membrane, ear canal and external ear normal. No drainage, swelling or tenderness. Tympanic membrane is not injected, scarred, erythematous, retracted or bulging.     Left Ear: Tympanic membrane, ear canal and external ear normal. No drainage, swelling or tenderness. Tympanic membrane is not injected, scarred, erythematous, retracted or bulging.     Nose: Mucosal edema and rhinorrhea present. No nasal deformity or septal deviation.     Right Sinus: No maxillary sinus tenderness or frontal sinus tenderness.     Left Sinus: No maxillary sinus tenderness or frontal sinus tenderness.     Mouth/Throat:     Mouth: Mucous membranes are not pale and not dry.     Pharynx: Uvula midline.  Eyes:     General:        Right eye: No discharge.        Left eye: No discharge.     Conjunctiva/sclera: Conjunctivae normal.     Right eye: Right conjunctiva is not injected. No chemosis.    Left eye: Left conjunctiva is not injected. No chemosis.    Pupils: Pupils are equal,  round, and reactive to light.  Cardiovascular:     Rate and Rhythm: Normal rate and regular rhythm.     Heart sounds: Normal heart sounds.  Pulmonary:     Effort: Pulmonary effort is normal.  No tachypnea, accessory muscle usage or respiratory distress.     Breath sounds: Normal breath sounds. No wheezing, rhonchi or rales.  Chest:     Chest wall: No tenderness.  Abdominal:     Tenderness: There is no abdominal tenderness. There is no guarding or rebound.  Lymphadenopathy:     Head:     Right side of head: No submandibular, tonsillar or occipital adenopathy.     Left side of head: No submandibular, tonsillar or occipital adenopathy.     Cervical: No cervical adenopathy.  Skin:    Coloration: Skin is not pale.     Findings: No abrasion, erythema, petechiae or rash. Rash is not papular, urticarial or vesicular.  Neurological:     Mental Status: She is alert.  Psychiatric:        Behavior: Behavior is cooperative.      Diagnostic studies:    Spirometry: results normal (FEV1: 1.13/71%, FVC: 1.66/81%, FEV1/FVC: 68%).    Spirometry consistent with normal pattern.   Allergy Studies:     Airborne Adult Perc - 09/07/22 1033     Time Antigen Placed 1018    Allergen Manufacturer Lavella Hammock    Location Back    Number of Test 59    1. Control-Buffer 50% Glycerol Negative    2. Control-Histamine 1 mg/ml 2+    3. Albumin saline Negative    4. [place] Negative    5. [place] Negative    6. Johnson Negative    7. [place] Blue Negative    8. Meadow Fescue Negative    9. Perennial Rye Negative    10. Sweet Vernal Negative    11. Timothy Negative    12. Cocklebur Negative    13. Burweed Marshelder Negative    14. Ragweed, short Negative    15. Ragweed, Giant Negative    16. Plantain,  English Negative    17. Lamb's Quarters Negative    18. Sheep Sorrell Negative    19. Rough Pigweed Negative    20. Marsh Elder, Rough Negative    21. Mugwort, Common Negative    22. Ash mix Negative     23. Birch mix Negative    24. Beech American Negative    25. Box, Elder Negative    26. Cedar, red Negative    27. Cottonwood, [place] Negative    28. Elm mix Negative    29. Hickory Negative    30. Maple mix 3+    31. Oak, [place] mix Negative    32. Pecan Pollen Negative    33. Pine mix Negative    34. Sycamore Eastern Negative    35. [place], Black Pollen 2+    36. Alternaria alternata Negative    37. Cladosporium Herbarum Negative    38. Aspergillus mix Negative    39. Penicillium mix Negative    40. Bipolaris sorokiniana (Helminthosporium) Negative    41. Drechslera spicifera (Curvularia) Negative    42. Mucor plumbeus Negative    43. Fusarium moniliforme Negative    44. Aureobasidium pullulans (pullulara) Negative    45. Rhizopus oryzae Negative    46. Botrytis cinera Negative    47. Epicoccum nigrum Negative    48. Phoma betae Negative    49. Candida Albicans Negative    50. Trichophyton mentagrophytes Negative    51. Mite, D Farinae  5,000 AU/ml Negative    52. Mite, D Pteronyssinus  5,000 AU/ml Negative    53. Cat Hair 10,000 BAU/ml Negative  54.  Dog Epithelia Negative    55. Mixed Feathers Negative    56. Horse Epithelia Negative    57. Cockroach, German Negative    58. Mouse Negative    59. Tobacco Leaf Negative             Intradermal - 09/07/22 1100     Time Antigen Placed 1045    Allergen Manufacturer Greer    Location Arm    Number of Test 14    Control Negative    [place] Negative    Johnson Negative    7 Grass Negative    Ragweed mix Negative    Weed mix Negative    Mold 1 Negative    Mold 2 Negative    Mold 3 Negative    Mold 4 Negative    Cat Negative    Dog Negative    Cockroach Negative    Mite mix Negative             Allergy testing results were read and interpreted by myself, documented by clinical staff.         Salvatore Marvel, MD [facility] [place] [facility]

## 2022-09-15 ENCOUNTER — Encounter (INDEPENDENT_AMBULATORY_CARE_PROVIDER_SITE_OTHER): Payer: Self-pay | Admitting: *Deleted

## 2022-09-17 DIAGNOSIS — Z23 Encounter for immunization: Secondary | ICD-10-CM | POA: Diagnosis not present

## 2022-09-20 DIAGNOSIS — R053 Chronic cough: Secondary | ICD-10-CM | POA: Diagnosis not present

## 2022-09-20 DIAGNOSIS — N393 Stress incontinence (female) (male): Secondary | ICD-10-CM | POA: Diagnosis not present

## 2022-09-20 DIAGNOSIS — K219 Gastro-esophageal reflux disease without esophagitis: Secondary | ICD-10-CM | POA: Diagnosis not present

## 2022-10-02 ENCOUNTER — Other Ambulatory Visit: Payer: Self-pay | Admitting: Internal Medicine

## 2022-10-17 DIAGNOSIS — R053 Chronic cough: Secondary | ICD-10-CM | POA: Diagnosis not present

## 2022-10-17 DIAGNOSIS — R093 Abnormal sputum: Secondary | ICD-10-CM | POA: Diagnosis not present

## 2022-10-17 DIAGNOSIS — K219 Gastro-esophageal reflux disease without esophagitis: Secondary | ICD-10-CM | POA: Diagnosis not present

## 2022-10-17 DIAGNOSIS — J4551 Severe persistent asthma with (acute) exacerbation: Secondary | ICD-10-CM | POA: Diagnosis not present

## 2022-11-14 ENCOUNTER — Other Ambulatory Visit (HOSPITAL_COMMUNITY): Payer: Self-pay | Admitting: Internal Medicine

## 2022-11-14 DIAGNOSIS — Z1231 Encounter for screening mammogram for malignant neoplasm of breast: Secondary | ICD-10-CM

## 2022-11-25 ENCOUNTER — Ambulatory Visit (INDEPENDENT_AMBULATORY_CARE_PROVIDER_SITE_OTHER): Payer: Medicare Other | Admitting: Internal Medicine

## 2022-11-25 ENCOUNTER — Encounter: Payer: Self-pay | Admitting: Internal Medicine

## 2022-11-25 VITALS — BP 132/80 | HR 90 | Temp 98.0°F | Ht 62.0 in | Wt 182.8 lb

## 2022-11-25 DIAGNOSIS — R058 Other specified cough: Secondary | ICD-10-CM | POA: Diagnosis not present

## 2022-11-25 DIAGNOSIS — I2584 Coronary atherosclerosis due to calcified coronary lesion: Secondary | ICD-10-CM | POA: Diagnosis not present

## 2022-11-25 DIAGNOSIS — I2510 Atherosclerotic heart disease of native coronary artery without angina pectoris: Secondary | ICD-10-CM | POA: Diagnosis not present

## 2022-11-25 MED ORDER — BENZONATATE 200 MG PO CAPS: 200.0000 mg | ORAL_CAPSULE | Freq: Three times a day (TID) | ORAL | 1 refills | Status: DC | PRN

## 2022-11-25 NOTE — Assessment & Plan Note (Addendum)
Onset was June 2023 but original cough was 2005 onset of allergy symptoms 1980s took shots x 6 y  helped  [facility] eval  09/18/18  Dx: severe LPR, worse off GERD rx so rec continue gerd rx indefinitely and f/u q 6 m (not done)  - Allergy screen 07/12/2022 >  Eos 0.3  /  IgE  105 - 07/12/2022 started gabapentin titrate to max of 100 mg x 3 tid / max gerd rx and 1st gen H1 blockers per guidelines  Hs and f/u q 2 weeks/ off losartan  - 07/29/2022 transiently improved but recurred w/in a week of d/c pred x 6 so rec pred x 8 days, titrate gabapentin to max of 300 tid and allergy eval > referred  - 08/26/2022 increased gabapentin to 300 mg bid and f/u allergy - 11/25/2022 doing fine off gabapentin and all inhalers so rec just max gerd rx and  1st gen H1 blockers per guidelines     Upper airway cough syndrome (previously labeled PNDS),  is so named because it's frequently impossible to sort out how much is  CR/sinusitis with freq throat clearing (which can be related to primary GERD)   vs  causing  secondary (" extra esophageal")  GERD from wide swings in gastric pressure that occur with throat clearing, often  promoting self use of mint and menthol lozenges that reduce the lower esophageal sphincter tone and exacerbate the problem further in a cyclical fashion.   These are the same pts (now being labeled as having "irritable larynx syndrome" by some cough centers) who not infrequently have a history of having failed to tolerate ace inhibitors,  dry powder inhalers or biphosphonates or report having atypical/extraesophageal reflux symptoms that don't respond to standard doses of PPI  and are easily confused as having aecopd or asthma flares by even experienced allergists/ pulmonologists (myself included).   For now ok to just use saba neb prn as it's the least likely to aggravate UACS   I continue to believe this is not asthma though MCT would be needed to prove it - she's getting confused with multiple providers rec  changes in her meds and since I don't believe this is a pulmonary problem I will see her back as needed with all meds in hand using a trust but verify approach to confirm accurate Medication  Reconciliation The principal here is that until we are certain that the  patients are doing what we've asked, it makes no sense to ask them to do more.          Each maintenance medication was reviewed in detail including emphasizing most importantly the difference between maintenance and prns and under what circumstances the prns are to be triggered using an action plan format where appropriate.  Total time for H and P, chart review, counseling, reviewing neb  device(s) and generating customized AVS unique to this office visit / same day charting = 30 min summary final f/u ov

## 2022-11-25 NOTE — Patient Instructions (Addendum)
Be sure protonix (pantoprazole ) 40 mg Take 30- 60 min before your first and last meals of the day   Continue pepcid 20 mg one  and chlorpheniramine '4mg'$  1-2  an hour before bedtime   For cough > tessalon 200 mg every 6 hours as needed  For breathing problems >  albuterol nebulizer as needed and if needing it more than a few times a week restart your aformoterol/budesonide twice daily   Pulmonary follow up is if needed >>> bring all medications /inhalers/ solutions in hand so we can verify exactly what you are taking. This includes all medications from all doctors and over the [place] separate them into two bags:  the ones you take automatically, no matter what, vs the ones you take just when you feel you need them "BAG #2 is UP TO YOU"  - this will really help [place] help you take your medications more effectively.

## 2022-11-25 NOTE — Progress Notes (Signed)
Tammy [name], female    DOB: June 02, 1946    MRN: [id number]   Brief patient profile:  9  yobf  stopped smoking age 76  referred to pulmonary clinic in [place]  07/12/2022 by Dr Maryellen Pile for acute resp failure/ severe UACS  with w/u by Benjamine Mola 2019 c/w GERD dx.   Admission date:  06/01/2022  Admitting Physician  Rolla Plate, DO Discharge Date:  06/09/2022    Discharge Diagnosis  Elevated troponin [R77.8] Acute respiratory failure with hypoxia (HCC) [J96.01] Severe asthma with exacerbation, unspecified whether persistent [J45.901] Single subsegmental pulmonary embolism without acute cor pulmonale (HCC) [I26.93]     Principal Problem:   Acute respiratory failure with hypoxia (HCC)   Essential hypertension   Pulmonary Embolus RULED OUT   Leukocytosis   Hyperlipidemia   Severe asthma with exacerbation   Elevated troponin  Acute respiratory failure with hypoxia ([place]) -Patient is a reformed smoker -- VQ scan and CT chest suggest possible emphysema/COPD component given smoking history this is likely -Patient also had significant allergic rhinitis/reactive airway disease -Patient reports no prior official diagnosis of asthma in the past COPD Vs Asthma Vs reactive airway disease in a patient with significant allergic rhinitis -Patient will benefit from outpatient PFT/spirometry once acute symptoms resolve to better delineate exact diagnosis -Patient has no oxygen requirement at baseline Pulmonary consult from Dr. Halford Chessman appreciated -PE work-up essentially negative -Cardiology input appreciated -COVID-negative -Much improved respiratory status after steroids, -Hypoxia resolved, Patient has been weaned off oxygen   History of Present Illness  07/12/2022  Pulmonary/ 1st office eval/ Leitha Hyppolite / [place] [facility] re refractory cough  Chief Complaint  Patient presents with   Hospitalization Follow-up    HFU from admission to [facility] 7/5-7/13 for chronic resp failure.  Patient has still had a  persistent cough since coming home.   Onset nasal stuffy nose /runny late 1970s allergy shots helped  but still needed allergy meds esp spring / fall and worse x 2010 rx by Dr Nevada Crane and bette typically p 2 weeks  rx nasal spray, cortisol, albuterol, short term only  This episode dates back to ? Mid June 2023  Dyspnea:  walking inside house ok  Cough: brought on by use of voice dry  hack worse since x one week p last pred 06/17/22 Sleep: pillows on flat bed and still coughs immediately at hs  SABA use: multiple inhalers not helping Rec Gabapentin 100 mg three times daily and every few days increase to 300 mg three times daily  Pantoprazole (protonix) 40 mg  (or omeprazole 20 x 2)   Take  30-60 min before first meal of the day and Pepcid (famotidine)  20 mg after supper until return to office   For drainage / throat tickle try take CHLORPHENIRAMINE  4 mg  Prednisone 10 mg take  4 each am x 2 days,   2 each am x 2 days,  1 each am x 2 days and stop  For breathing difficulty > albuterol  neb up to every 4 hours as needed Take delsym two tsp every 12 hours and supplement if needed with  Tylenol #3   up to 1-2 every 4 hours Once you have eliminated the cough for 3 straight days try reducing the Tylenol #3 first,  then the delsym as tolerated.    Valsartan 160-25 one daily in place of losartan  Please schedule a follow up office visit in 2 weeks, sooner if needed  with all medications /inhalers/  solutions in hand         07/29/2022  f/u ov/[place] [facility] re: cough x  maint on gerd rx/ did not titrate up gabapentin, last saw [facility]  [facility] 09/18/18  Dx: severe LPR, worse off GERD rx so rec continue gerd rx indefinitely and f/u q 6 m (not done)  Chief Complaint  Patient presents with   Follow-up    Feels better but cough is starting to come back   Dyspnea:  not really limited by breathing if not coughing  Cough: dry/ raspy with hoarseness  Sleeping: better p h2  SABA use: none  02: none  Covid  status: all x the last  Rec Prednisone Take 4 for two days three for two days two for two days one for two days  Gabapentin 100 mg three times daily and every few days increase by 100 mg until cough is 100% maximum dose 300 mg three times daily  Pantoprazole (protonix) 40 mg  (or omeprazole 20 x 2)   Take  30-60 min before first meal of the day and Pepcid (famotidine)  20 mg after supper until return to office -  For drainage / throat tickle try take CHLORPHENIRAMINE  4 mg    For breathing difficulty > albuterol  neb up to every 4 hours as needed Take delsym two tsp every 12 hours and supplement if needed with  Tylenol #3   up to 1-2 every 4 hour Once you have eliminated the cough for 3 straight days try reducing the Tylenol #3 first,  then the delsym as tolerated.     referral to allergy in [place]  > see note       08/26/2022  f/u ov/[place] office/[facility] re: cough x  1970s/ worse since 2010 and much worse June 2023 maint on gerd rx and gabapentin 400 mg   Chief Complaint  Patient presents with   Follow-up    Cough has improved since last ov but still present    Dyspnea:  Not limited by breathing from desired activities  - [place]  Cough: sporadic  Sleeping: ok p 1st gen H1 blockers per guidelines  x 4 mg daily  SABA use: none now  02: none  Rec Increase gabapentin to 300 mg twice daily  Keep appt with allergy > seen 09/07/22 with nl spirometry ? Before or after rx      11/25/2022  f/u ov/[place] office/Margreat Widener re: uiacs  maint on gerd rx  / stopped gabapentin due to legs hurting/ improved and no worse cough Chief Complaint  Patient presents with   Follow-up    Cough has improved    Dyspnea:  not really limited Cough: much better  Sleeping: flat bed with pillows  SABA use: confused with maint vs prns, has brovana/pulmocort/yupelri but not using  02: none  Covid status: vax max  Very confused with instructions re inhalers/ nebs so not using any       No obvious day to day or daytime variability or assoc excess/ purulent sputum or mucus plugs or hemoptysis or cp or chest tightness, subjective wheeze or overt sinus or hb symptoms.   Sleeping  without nocturnal  or early am exacerbation  of respiratory  c/o's or need for noct saba. Also denies any obvious fluctuation of symptoms with weather or environmental changes or other aggravating or alleviating factors except as outlined above   No unusual exposure hx or h/o childhood pna/ asthma or knowledge of premature  birth.  Current Allergies, Complete Past Medical History, Past Surgical History, Family History, and Social History were reviewed in [employer] record.  ROS  The following are not active complaints unless bolded Hoarseness, sore throat, dysphagia, dental problems, itching, sneezing,  nasal congestion or discharge of excess mucus or purulent secretions, ear ache,   fever, chills, sweats, unintended wt loss or wt gain, classically pleuritic or exertional cp,  orthopnea pnd or arm/hand swelling  or leg swelling, presyncope, palpitations, abdominal pain, anorexia, nausea, vomiting, diarrhea  or change in bowel habits or change in bladder habits, change in stools or change in urine, dysuria, hematuria,  rash, arthralgias, visual complaints, headache, numbness, weakness or ataxia or problems with walking or coordination,  change in mood or  memory.        Current Meds  - - NOTE:   Unable to verify as accurately reflecting what pt takes    Medication Sig   albuterol (PROVENTIL) (2.5 MG/3ML) 0.083% nebulizer solution Take 3 mLs (2.5 mg total) by nebulization every 4 (four) hours as needed for wheezing or shortness of breath.   benzonatate (TESSALON) 100 MG capsule Take 100 mg by mouth 3 (three) times daily.   Budesonide (PULMICORT IN) Inhale into the lungs.   famotidine (PEPCID) 20 MG tablet One after supper   ipratropium (ATROVENT) 0.03 % nasal spray Place 2 sprays  into both nostrils 3 (three) times daily.   montelukast (SINGULAIR) 10 MG tablet Take 10 mg by mouth at bedtime.   pantoprazole (PROTONIX) 40 MG tablet TAKE 1 TABLET (40 MG TOTAL) BY MOUTH DAILY. TAKE 30-60 MIN BEFORE FIRST MEAL OF THE DAY   revefenacin (YUPELRI) 175 MCG/3ML nebulizer solution Take 175 mcg by nebulization daily.   rosuvastatin (CRESTOR) 5 MG tablet Take 1 tablet (5 mg total) by mouth daily.   valsartan-hydrochlorothiazide (DIOVAN-HCT) 160-25 MG tablet TAKE 1 TABLET BY MOUTH EVERY DAY                   Past Medical History:  Diagnosis Date   Asthma    Hypertension         Objective:     Wts  11/25/2022      182  08/26/2022        185   07/29/22 182 lb (82.6 kg)  07/25/22 180 lb 6.4 oz (81.8 kg)  07/12/22 178 lb 12.8 oz (81.1 kg)    Vital signs reviewed  11/25/2022  - Note at rest 02 sats  97% on RA   General appearance:    amb bf, no spont cough or throat clearing    HEENT : Oropharynx  clar      Nasal turbinates nl    NECK :  without  apparent JVD/ palpable Nodes/TM    LUNGS: no acc muscle use,  Nl contour chest which is clear to A and P bilaterally without cough on insp or exp maneuvers   CV:  RRR  no s3 or murmur or increase in P2, and no edema   ABD:  soft and nontender with nl inspiratory excursion in the supine position. No bruits or organomegaly appreciated   MS:  Nl gait/ ext warm without deformities Or obvious joint restrictions  calf tenderness, cyanosis or clubbing    SKIN: warm and dry without lesions    NEURO:  alert, approp, nl sensorium with  no motor or cerebellar deficits apparent.  Assessment

## 2022-12-08 ENCOUNTER — Encounter (INDEPENDENT_AMBULATORY_CARE_PROVIDER_SITE_OTHER): Payer: Self-pay | Admitting: Gastroenterology

## 2022-12-08 ENCOUNTER — Ambulatory Visit (INDEPENDENT_AMBULATORY_CARE_PROVIDER_SITE_OTHER): Payer: Medicare Other | Admitting: Gastroenterology

## 2022-12-08 VITALS — BP 133/76 | HR 93 | Temp 97.3°F | Ht 65.0 in | Wt 184.5 lb

## 2022-12-08 DIAGNOSIS — K219 Gastro-esophageal reflux disease without esophagitis: Secondary | ICD-10-CM | POA: Insufficient documentation

## 2022-12-08 DIAGNOSIS — R053 Chronic cough: Secondary | ICD-10-CM | POA: Diagnosis not present

## 2022-12-08 MED ORDER — OMEPRAZOLE 40 MG PO CPDR: 40.0000 mg | DELAYED_RELEASE_CAPSULE | Freq: Two times a day (BID) | ORAL | 2 refills | Status: DC

## 2022-12-08 NOTE — Progress Notes (Addendum)
Referring Provider: Celene Squibb, MD Primary Care Physician:  Celene Squibb, MD Primary GI Physician: new   Chief Complaint  Patient presents with   Cough    Patient here today due to a cough she has had since July 2023.She has to constantly clear her throat. She is taking pantoprazole 40 mg bid, and famotidine 20 mg after dinner. She has occasional diarrhea, when this happens she has the occasional issue with fecal incontinence.   HPI:   Tammy [name] is a 76 y.o. female with past medical history of asthma/hypertension   Patient presenting today as a new patient for cough/GERD   Patient reports chronic cough since July 2023, she notes that she had a stent in the ICU for about a week due to lung issues, she presented initially for SOB. She states that she has always had an issues with bad allergies. She sees pulmonology (Dr. Melvyn Novas) and has seen an allergist, felt that cough was not solely related to her lungs or allergies from her workup and advised to see GI for further evaluation of GERD related cough. Prior to starting pantoprazole she had some heartburn and acid regurgitation, she did trial not taking PPI for a few days and by the 4th day of not being on it, her cough was worse. She was increased to twice a day thereafter after discussion between PCP and Dr. Melvyn Novas. Has little to no acid reflux or acid regurgitation. She sometimes feels that something is stuck in her throat on the right side, this will trigger her cough. Cough is sometimes worse at night, sometimes can even be very bad during the day, she tries to keep HOB elevated. No real issues with dysphagia, occasional feels that larger pills go down slower. She is hoarse often. She does note that being outside for any amount of time triggers a cough as well. She is on allergy treatment, nebulizer, pulmicort, nasal spray, tessalon currently.   NSAID URK:YHCW tylenol as needed  Social hx:no etoh or tobacco use  Fam hx: no CRC or liver  disease  Last Colonoscopy:07/2016 - Diverticulosis in the sigmoid colon, in the descending colon and in the transverse colon.  - External hemorrhoids. - Anal papilla(e) were hypertrophied. - No specimens collected. Last Endoscopy:never  Recommendations:  No repeat colonoscopy recommended due to age   Past Medical History:  Diagnosis Date   Asthma    Hypertension     Past Surgical History:  Procedure Laterality Date   ABDOMINAL HYSTERECTOMY  1988   CATARACT EXTRACTION W/PHACO Right 09/27/2019   Procedure: CATARACT EXTRACTION PHACO AND INTRAOCULAR LENS PLACEMENT (IOC) (CDE: 4.94);  Surgeon: Baruch Goldmann, MD;  Location: [facility];  Service: Ophthalmology;  Laterality: Right;   CATARACT EXTRACTION W/PHACO Left 10/18/2019   Procedure: CATARACT EXTRACTION PHACO AND INTRAOCULAR LENS PLACEMENT (IOC);  Surgeon: Baruch Goldmann, MD;  Location: [facility];  Service: Ophthalmology;  Laterality: Left;  CDE: 7.18   COLONOSCOPY N/A 08/11/2016   Procedure: COLONOSCOPY;  Surgeon: Rogene Houston, MD;  Location: [facility] SUITE;  Service: Endoscopy;  Laterality: N/A;  730   ELBOW FRACTURE SURGERY     KNEE ARTHROSCOPY     SHOULDER OPEN ROTATOR CUFF REPAIR Right 10/06/2016   Procedure: OPEN ROTATOR CUFF REPAIR RIGHT SHOULDER;  Surgeon: Carole Civil, MD;  Location: [facility];  Service: Orthopedics;  Laterality: Right;    Current Outpatient Medications  Medication Sig Dispense Refill   albuterol (PROVENTIL) (2.5 MG/3ML) 0.083% nebulizer solution Take 3  mLs (2.5 mg total) by nebulization every 4 (four) hours as needed for wheezing or shortness of breath. 75 mL 2   benzonatate (TESSALON) 200 MG capsule Take 1 capsule (200 mg total) by mouth 3 (three) times daily as needed for cough. 30 capsule 1   Budesonide (PULMICORT IN) Inhale into the lungs.     chlorpheniramine (CHLOR-TRIMETON) 4 MG tablet Take 4 mg by mouth every 4 (four) hours as needed for allergies.     famotidine (PEPCID) 20 MG tablet One after  supper 30 tablet 11   ipratropium (ATROVENT) 0.03 % nasal spray Place 2 sprays into both nostrils 3 (three) times daily. 30 mL 5   montelukast (SINGULAIR) 10 MG tablet Take 10 mg by mouth at bedtime.     pantoprazole (PROTONIX) 40 MG tablet TAKE 1 TABLET (40 MG TOTAL) BY MOUTH DAILY. TAKE 30-60 MIN BEFORE FIRST MEAL OF THE DAY (Patient taking differently: Take 40 mg by mouth 2 (two) times daily.) 90 tablet 1   revefenacin (YUPELRI) 175 MCG/3ML nebulizer solution Take 175 mcg by nebulization daily.     rosuvastatin (CRESTOR) 5 MG tablet Take 1 tablet (5 mg total) by mouth daily. 90 tablet 2   valsartan-hydrochlorothiazide (DIOVAN-HCT) 160-25 MG tablet TAKE 1 TABLET BY MOUTH EVERY DAY 90 tablet 6   No current facility-administered medications for this visit.    Allergies as of 12/08/2022   (No Known Allergies)    Family History  Problem Relation Age of Onset   Eczema Father    Asthma Father    Cancer Maternal Grandmother     Social History   Socioeconomic History   Marital status: Widowed    Spouse name: Not on file   Number of children: Not on file   Years of education: Not on file   Highest education level: Not on file  Occupational History   Not on file  Tobacco Use   Smoking status: Former    Packs/day: 0.75    Years: 15.00    Total pack years: 11.25    Types: Cigarettes    Quit date: 11/28/1983    Years since quitting: 39.0   Smokeless tobacco: Never  Vaping Use   Vaping Use: Never used  Substance and Sexual Activity   Alcohol use: No   Drug use: No   Sexual activity: Not Currently  Other Topics Concern   Not on file  Social History Narrative   Not on file   Social Determinants of Health   Financial Resource Strain: Not on file  Food Insecurity: Not on file  Transportation Needs: Not on file  Physical Activity: Not on file  Stress: Not on file  Social Connections: Not on file    Review of systems General: negative for malaise, night sweats, fever,  chills, weight loss Neck: Negative for lumps, goiter, pain and significant neck swelling Resp: Negative for wheezing, dyspnea at rest +cough  CV: Negative for chest pain, leg swelling, palpitations, orthopnea GI: denies melena, hematochezia, nausea, vomiting, diarrhea, constipation, dysphagia, odyonophagia, early satiety or unintentional weight loss. +globus sensation  MSK: Negative for joint pain or swelling, back pain, and muscle pain. Derm: Negative for itching or rash Psych: Denies depression, anxiety, memory loss, confusion. No homicidal or suicidal ideation.  Heme: Negative for prolonged bleeding, bruising easily, and swollen nodes. Endocrine: Negative for cold or heat intolerance, polyuria, polydipsia and goiter. Neuro: negative for tremor, gait imbalance, syncope and seizures. The remainder of the review of systems is noncontributory.  Physical  Exam: BP 133/76 (BP Location: Left Arm, Patient Position: Sitting, Cuff Size: Large)   Pulse 93   Temp (!) 97.3 F (36.3 C) (Temporal)   Ht '5\' 5"'$  (1.651 m)   Wt 184 lb 8 oz (83.7 kg)   BMI 30.70 kg/m  General:   Alert and oriented. No distress noted. Pleasant and cooperative.  Head:  Normocephalic and atraumatic. Eyes:  Conjuctiva clear without scleral icterus. Mouth:  Oral mucosa pink and moist. Good dentition. No lesions. Heart: Normal rate and rhythm, s1 and s2 heart sounds present.  Lungs: Clear lung sounds in all lobes. Respirations equal and unlabored. Abdomen:  +BS, soft, non-tender and non-distended. No rebound or guarding. No HSM or masses noted. Derm: No palmar erythema or jaundice Msk:  Symmetrical without gross deformities. Normal posture. Extremities:  Without edema. Neurologic:  Alert and  oriented x4 Psych:  Alert and cooperative. Normal mood and affect.  Invalid input(s): "6 MONTHS"   ASSESSMENT: NEVENA ROZENBERG is a 77 y.o. female presenting today for suspected GERD related cough.  Chronic cough since July 2023,  sees pulmonary and has seen allergist with normal PFTs, she is on current therapy for allergies and COPD/Asthma, cough has persisted. Notably coughing throughout visit, she does not get much up when she coughs.  Previously with some acid reflux symptoms she was started on Protonix 40 mg once daily and then increase to twice daily dosing with improvement of her GERD symptoms however she continues to experience a cough.  She also notes a sensation of something stuck on the right side of her throat at times it tends to elicit her cough.  She denies any overt dysphagia.  She is hoarse often.  Cough may very well be multifactorial in setting of GERD, COPD, asthma, allergies.  Recommend stopping Protonix and starting omeprazole 40 mg twice daily, discussed importance of good reflux precautions to include avoiding greasy, spicy, fried, citrus foods, and be mindful that caffeine, carbonated drinks, chocolate and alcohol can increase reflux symptoms, Stay upright 2-3 hours after eating, prior to lying down and avoid eating late in the evenings. also recommend proceeding with upper endoscopy for further evaluation of her symptoms. Indications, risks and benefits of procedure discussed in detail with patient. Patient verbalized understanding and is in agreement to proceed with EGD at this time.    PLAN:  Schedule EGD ASA III ENDO 3  2. Stop protonix  3. Start omeprazole '40mg'$  BID  4. Good reflux precautions   All questions were answered, patient verbalized understanding and is in agreement with plan as outlined above.   Follow Up: 3 months   Niranjan Rufener L. Alver Sorrow, MSN, APRN, AGNP-C Adult-Gerontology Nurse Practitioner [facility]  I have reviewed the note and agree with the APP's assessment as described in this progress note  Depending on endoscopic findings, may need to undergo pH impedance testing. Ultimately, if not improving with PPI, can discuss possible TIF for symptom  control.  Maylon Peppers, MD Gastroenterology and Hepatology [facility]

## 2022-12-08 NOTE — H&P (View-Only) (Signed)
Referring Provider: Celene Squibb, MD Primary Care Physician:  Celene Squibb, MD Primary GI Physician: new   Chief Complaint  Patient presents with   Cough    Patient here today due to a cough she has had since July 2023.She has to constantly clear her throat. She is taking pantoprazole 40 mg bid, and famotidine 20 mg after dinner. She has occasional diarrhea, when this happens she has the occasional issue with fecal incontinence.   HPI:   Tammy [name] is a 77 y.o. female with past medical history of asthma/hypertension   Patient presenting today as a new patient for cough/GERD   Patient reports chronic cough since July 2023, she notes that she had a stent in the ICU for about a week due to lung issues, she presented initially for SOB. She states that she has always had an issues with bad allergies. She sees pulmonology (Dr. Melvyn Novas) and has seen an allergist, felt that cough was not solely related to her lungs or allergies from her workup and advised to see GI for further evaluation of GERD related cough. Prior to starting pantoprazole she had some heartburn and acid regurgitation, she did trial not taking PPI for a few days and by the 4th day of not being on it, her cough was worse. She was increased to twice a day thereafter after discussion between PCP and Dr. Melvyn Novas. Has little to no acid reflux or acid regurgitation. She sometimes feels that something is stuck in her throat on the right side, this will trigger her cough. Cough is sometimes worse at night, sometimes can even be very bad during the day, she tries to keep HOB elevated. No real issues with dysphagia, occasional feels that larger pills go down slower. She is hoarse often. She does note that being outside for any amount of time triggers a cough as well. She is on allergy treatment, nebulizer, pulmicort, nasal spray, tessalon currently.   NSAID HKV:QQVZ tylenol as needed  Social hx:no etoh or tobacco use  Fam hx: no CRC or liver  disease  Last Colonoscopy:07/2016 - Diverticulosis in the sigmoid colon, in the descending colon and in the transverse colon.  - External hemorrhoids. - Anal papilla(e) were hypertrophied. - No specimens collected. Last Endoscopy:never  Recommendations:  No repeat colonoscopy recommended due to age   Past Medical History:  Diagnosis Date   Asthma    Hypertension     Past Surgical History:  Procedure Laterality Date   ABDOMINAL HYSTERECTOMY  1988   CATARACT EXTRACTION W/PHACO Right 09/27/2019   Procedure: CATARACT EXTRACTION PHACO AND INTRAOCULAR LENS PLACEMENT (IOC) (CDE: 4.94);  Surgeon: Baruch Goldmann, MD;  Location: [facility];  Service: Ophthalmology;  Laterality: Right;   CATARACT EXTRACTION W/PHACO Left 10/18/2019   Procedure: CATARACT EXTRACTION PHACO AND INTRAOCULAR LENS PLACEMENT (IOC);  Surgeon: Baruch Goldmann, MD;  Location: [facility];  Service: Ophthalmology;  Laterality: Left;  CDE: 7.18   COLONOSCOPY N/A 08/11/2016   Procedure: COLONOSCOPY;  Surgeon: Rogene Houston, MD;  Location: [facility];  Service: Endoscopy;  Laterality: N/A;  730   ELBOW FRACTURE SURGERY     KNEE ARTHROSCOPY     SHOULDER OPEN ROTATOR CUFF REPAIR Right 10/06/2016   Procedure: OPEN ROTATOR CUFF REPAIR RIGHT SHOULDER;  Surgeon: Carole Civil, MD;  Location: [facility];  Service: Orthopedics;  Laterality: Right;    Current Outpatient Medications  Medication Sig Dispense Refill   albuterol (PROVENTIL) (2.5 MG/3ML) 0.083% nebulizer solution Take 3  mLs (2.5 mg total) by nebulization every 4 (four) hours as needed for wheezing or shortness of breath. 75 mL 2   benzonatate (TESSALON) 200 MG capsule Take 1 capsule (200 mg total) by mouth 3 (three) times daily as needed for cough. 30 capsule 1   Budesonide (PULMICORT IN) Inhale into the lungs.     chlorpheniramine (CHLOR-TRIMETON) 4 MG tablet Take 4 mg by mouth every 4 (four) hours as needed for allergies.     famotidine (PEPCID) 20 MG tablet One after  supper 30 tablet 11   ipratropium (ATROVENT) 0.03 % nasal spray Place 2 sprays into both nostrils 3 (three) times daily. 30 mL 5   montelukast (SINGULAIR) 10 MG tablet Take 10 mg by mouth at bedtime.     pantoprazole (PROTONIX) 40 MG tablet TAKE 1 TABLET (40 MG TOTAL) BY MOUTH DAILY. TAKE 30-60 MIN BEFORE FIRST MEAL OF THE DAY (Patient taking differently: Take 40 mg by mouth 2 (two) times daily.) 90 tablet 1   revefenacin (YUPELRI) 175 MCG/3ML nebulizer solution Take 175 mcg by nebulization daily.     rosuvastatin (CRESTOR) 5 MG tablet Take 1 tablet (5 mg total) by mouth daily. 90 tablet 2   valsartan-hydrochlorothiazide (DIOVAN-HCT) 160-25 MG tablet TAKE 1 TABLET BY MOUTH EVERY DAY 90 tablet 6   No current facility-administered medications for this visit.    Allergies as of 12/08/2022   (No Known Allergies)    Family History  Problem Relation Age of Onset   Eczema Father    Asthma Father    Cancer Maternal Grandmother     Social History   Socioeconomic History   Marital status: Widowed    Spouse name: Not on file   Number of children: Not on file   Years of education: Not on file   Highest education level: Not on file  Occupational History   Not on file  Tobacco Use   Smoking status: Former    Packs/day: 0.75    Years: 15.00    Total pack years: 11.25    Types: Cigarettes    Quit date: 11/28/1983    Years since quitting: 39.0   Smokeless tobacco: Never  Vaping Use   Vaping Use: Never used  Substance and Sexual Activity   Alcohol use: No   Drug use: No   Sexual activity: Not Currently  Other Topics Concern   Not on file  Social History Narrative   Not on file   Social Determinants of Health   Financial Resource Strain: Not on file  Food Insecurity: Not on file  Transportation Needs: Not on file  Physical Activity: Not on file  Stress: Not on file  Social Connections: Not on file    Review of systems General: negative for malaise, night sweats, fever,  chills, weight loss Neck: Negative for lumps, goiter, pain and significant neck swelling Resp: Negative for wheezing, dyspnea at rest +cough  CV: Negative for chest pain, leg swelling, palpitations, orthopnea GI: denies melena, hematochezia, nausea, vomiting, diarrhea, constipation, dysphagia, odyonophagia, early satiety or unintentional weight loss. +globus sensation  MSK: Negative for joint pain or swelling, back pain, and muscle pain. Derm: Negative for itching or rash Psych: Denies depression, anxiety, memory loss, confusion. No homicidal or suicidal ideation.  Heme: Negative for prolonged bleeding, bruising easily, and swollen nodes. Endocrine: Negative for cold or heat intolerance, polyuria, polydipsia and goiter. Neuro: negative for tremor, gait imbalance, syncope and seizures. The remainder of the review of systems is noncontributory.  Physical  Exam: BP 133/76 (BP Location: Left Arm, Patient Position: Sitting, Cuff Size: Large)   Pulse 93   Temp (!) 97.3 F (36.3 C) (Temporal)   Ht '5\' 5"'$  (1.651 m)   Wt 184 lb 8 oz (83.7 kg)   BMI 30.70 kg/m  General:   Alert and oriented. No distress noted. Pleasant and cooperative.  Head:  Normocephalic and atraumatic. Eyes:  Conjuctiva clear without scleral icterus. Mouth:  Oral mucosa pink and moist. Good dentition. No lesions. Heart: Normal rate and rhythm, s1 and s2 heart sounds present.  Lungs: Clear lung sounds in all lobes. Respirations equal and unlabored. Abdomen:  +BS, soft, non-tender and non-distended. No rebound or guarding. No HSM or masses noted. Derm: No palmar erythema or jaundice Msk:  Symmetrical without gross deformities. Normal posture. Extremities:  Without edema. Neurologic:  Alert and  oriented x4 Psych:  Alert and cooperative. Normal mood and affect.  Invalid input(s): "6 MONTHS"   ASSESSMENT: AERIAL DILLEY is a 77 y.o. female presenting today for suspected GERD related cough.  Chronic cough since July 2023,  sees pulmonary and has seen allergist with normal PFTs, she is on current therapy for allergies and COPD/Asthma, cough has persisted. Notably coughing throughout visit, she does not get much up when she coughs.  Previously with some acid reflux symptoms she was started on Protonix 40 mg once daily and then increase to twice daily dosing with improvement of her GERD symptoms however she continues to experience a cough.  She also notes a sensation of something stuck on the right side of her throat at times it tends to elicit her cough.  She denies any overt dysphagia.  She is hoarse often.  Cough may very well be multifactorial in setting of GERD, COPD, asthma, allergies.  Recommend stopping Protonix and starting omeprazole 40 mg twice daily, discussed importance of good reflux precautions to include avoiding greasy, spicy, fried, citrus foods, and be mindful that caffeine, carbonated drinks, chocolate and alcohol can increase reflux symptoms, Stay upright 2-3 hours after eating, prior to lying down and avoid eating late in the evenings. also recommend proceeding with upper endoscopy for further evaluation of her symptoms. Indications, risks and benefits of procedure discussed in detail with patient. Patient verbalized understanding and is in agreement to proceed with EGD at this time.    PLAN:  Schedule EGD ASA III ENDO 3  2. Stop protonix  3. Start omeprazole '40mg'$  BID  4. Good reflux precautions   All questions were answered, patient verbalized understanding and is in agreement with plan as outlined above.   Follow Up: 3 months   Tammy [name] L. Alver Sorrow, MSN, APRN, AGNP-C Adult-Gerontology Nurse Practitioner [facility]  I have reviewed the note and agree with the APP's assessment as described in this progress note  Depending on endoscopic findings, may need to undergo pH impedance testing. Ultimately, if not improving with PPI, can discuss possible TIF for symptom  control.  Maylon Peppers, MD Gastroenterology and Hepatology [facility] Gastroenterology

## 2022-12-08 NOTE — Patient Instructions (Signed)
Please stop pantoprazole and famotidine We will start omeprazole '40mg'$  twice daily and get you scheduled for upper endoscopy for further evaluation Continue to sleep with head of bed elevated.   Be mindful of greasy, spicy, fried, citrus/tomato based foods,caffeine, carbonated drinks, chocolate and alcohol as these can increase reflux symptoms Stay upright 2-3 hours after eating, prior to lying down and avoid eating late in the evenings.   Follow up 3 months

## 2022-12-09 ENCOUNTER — Ambulatory Visit: Payer: Medicare Other | Admitting: Allergy & Immunology

## 2022-12-12 ENCOUNTER — Encounter: Payer: Self-pay | Admitting: *Deleted

## 2022-12-14 ENCOUNTER — Encounter: Payer: Self-pay | Admitting: *Deleted

## 2022-12-21 DIAGNOSIS — J029 Acute pharyngitis, unspecified: Secondary | ICD-10-CM | POA: Diagnosis not present

## 2022-12-26 ENCOUNTER — Ambulatory Visit (HOSPITAL_COMMUNITY)
Admission: RE | Admit: 2022-12-26 | Discharge: 2022-12-26 | Disposition: A | Discharge status: Home / Self Care | Payer: Medicare Other | Source: Ambulatory Visit | Attending: Internal Medicine | Admitting: Internal Medicine

## 2022-12-26 DIAGNOSIS — Z1231 Encounter for screening mammogram for malignant neoplasm of breast: Secondary | ICD-10-CM | POA: Insufficient documentation

## 2022-12-27 NOTE — Patient Instructions (Signed)
EVI MCCOMB  12/27/2022     '@PREFPERIOPPHARMACY'$ @   Your procedure is scheduled on  12/30/2022.   Report to [facility] at  [place].   Call this number if you have problems the morning of surgery:  [phone]  If you experience any cold or flu symptoms such as cough, fever, chills, shortness of breath, etc. between now and your scheduled surgery, please notify us at the above number.   Remember:  Follow the diet instructions given to you by the office.      Use your nebulizer and your inhaler before you come and bring your rescue inhaler with you.     Take these medicines the morning of surgery with A SIP OF WATER                                    omeprazole.     Do not wear jewelry, make-up or nail polish.  Do not wear lotions, powders, or perfumes, or deodorant.  Do not shave 48 hours prior to surgery.  Men may shave face and neck.  Do not bring valuables to the hospital.  [facility] is not responsible for any belongings or valuables.  Contacts, dentures or bridgework may not be worn into surgery.  Leave your suitcase in the car.  After surgery it may be brought to your room.  For patients admitted to the hospital, discharge time will be determined by your treatment team.  Patients discharged the day of surgery will not be allowed to drive home and must have someone with them for 24 hours.    Special instructions:   DO NOT smoke tobacco or vape for 24 hours before your procedure.  Please read over the following fact sheets that you were given. Anesthesia Post-op Instructions and Care and Recovery After Surgery      Upper Endoscopy, Adult, Care After After the procedure, it is common to have a sore throat. It is also common to have: Mild stomach pain or discomfort. Bloating. Nausea. Follow these instructions at home: The instructions below may help you care for yourself at home. Your health care provider may give you more instructions. If you have  questions, ask your health care provider. If you were given a sedative during the procedure, it can affect you for several hours. Do not drive or operate machinery until your health care provider says that it is safe. If you will be going home right after the procedure, plan to have a responsible adult: Take you home from the hospital or clinic. You will not be allowed to drive. Care for you for the time you are told. Follow instructions from your health care provider about what you may eat and drink. Return to your normal activities as told by your health care provider. Ask your health care provider what activities are safe for you. Take over-the-counter and prescription medicines only as told by your health care provider. Contact a health care provider if you: Have a sore throat that lasts longer than one day. Have trouble swallowing. Have a fever. Get help right away if you: Vomit blood or your vomit looks like coffee grounds. Have bloody, black, or tarry stools. Have a very bad sore throat or you cannot swallow. Have difficulty breathing or very bad pain in your chest or abdomen. These symptoms may be an emergency. Get help right away.  Call 911. Do not wait to see if the symptoms will go away. Do not drive yourself to the hospital. Summary After the procedure, it is common to have a sore throat, mild stomach discomfort, bloating, and nausea. If you were given a sedative during the procedure, it can affect you for several hours. Do not drive until your health care provider says that it is safe. Follow instructions from your health care provider about what you may eat and drink. Return to your normal activities as told by your health care provider. This information is not intended to replace advice given to you by your health care provider. Make sure you discuss any questions you have with your health care provider. Document Revised: 02/23/2022 Document Reviewed: 02/23/2022 Elsevier  Patient Education  [place] After The following information offers guidance on how to care for yourself after your procedure. Your health care provider may also give you more specific instructions. If you have problems or questions, contact your health care provider. What can I expect after the procedure? After the procedure, it is common to have: Tiredness. Little or no memory about what happened during or after the procedure. Impaired judgment when it comes to making decisions. Nausea or vomiting. Some trouble with balance. Follow these instructions at home: For the time period you were told by your health care provider:  Rest. Do not participate in activities where you could fall or become injured. Do not drive or use machinery. Do not drink alcohol. Do not take sleeping pills or medicines that cause drowsiness. Do not make important decisions or sign legal documents. Do not take care of children on your own. Medicines Take over-the-counter and prescription medicines only as told by your health care provider. If you were prescribed antibiotics, take them as told by your health care provider. Do not stop using the antibiotic even if you start to feel better. Eating and drinking Follow instructions from your health care provider about what you may eat and drink. Drink enough fluid to keep your urine pale yellow. If you vomit: Drink clear fluids slowly and in small amounts as you are able. Clear fluids include water, ice chips, low-calorie sports drinks, and fruit juice that has water added to it (diluted fruit juice). Eat light and bland foods in small amounts as you are able. These foods include bananas, applesauce, rice, lean meats, toast, and crackers. General instructions  Have a responsible adult stay with you for the time you are told. It is important to have someone help care for you until you are awake and alert. If you have sleep  apnea, surgery and some medicines can increase your risk for breathing problems. Follow instructions from your health care provider about wearing your sleep device: When you are sleeping. This includes during daytime naps. While taking prescription pain medicines, sleeping medicines, or medicines that make you drowsy. Do not use any products that contain nicotine or tobacco. These products include cigarettes, chewing tobacco, and vaping devices, such as e-cigarettes. If you need help quitting, ask your health care provider. Contact a health care provider if: You feel nauseous or vomit every time you eat or drink. You feel light-headed. You are still sleepy or having trouble with balance after 24 hours. You get a rash. You have a fever. You have redness or swelling around the IV site. Get help right away if: You have trouble breathing. You have new confusion after you get home. These symptoms may be  an emergency. Get help right away. Call 911. Do not wait to see if the symptoms will go away. Do not drive yourself to the hospital. This information is not intended to replace advice given to you by your health care provider. Make sure you discuss any questions you have with your health care provider. Document Revised: 04/11/2022 Document Reviewed: 04/11/2022 Elsevier Patient Education  [place].

## 2022-12-28 ENCOUNTER — Encounter (HOSPITAL_COMMUNITY): Payer: Self-pay

## 2022-12-28 ENCOUNTER — Encounter (HOSPITAL_COMMUNITY)
Admission: RE | Admit: 2022-12-28 | Discharge: 2022-12-28 | Disposition: A | Discharge status: Home / Self Care | Payer: Medicare Other | Source: Ambulatory Visit | Attending: Gastroenterology | Admitting: Gastroenterology

## 2022-12-28 VITALS — BP 117/62 | HR 92 | Temp 97.8°F | Resp 18 | Ht 65.0 in | Wt 178.0 lb

## 2022-12-28 DIAGNOSIS — Z01812 Encounter for preprocedural laboratory examination: Secondary | ICD-10-CM | POA: Insufficient documentation

## 2022-12-28 DIAGNOSIS — Z79899 Other long term (current) drug therapy: Secondary | ICD-10-CM | POA: Insufficient documentation

## 2022-12-28 LAB — BASIC METABOLIC PANEL
Anion gap: 10 (ref 5–15)
BUN: 16 mg/dL (ref 8–23)
CO2: 26 mmol/L (ref 22–32)
Calcium: 9.5 mg/dL (ref 8.9–10.3)
Chloride: 102 mmol/L (ref 98–111)
Creatinine, Ser: 1.01 mg/dL — ABNORMAL HIGH (ref 0.44–1.00)
GFR, Estimated: 57 mL/min — ABNORMAL LOW (ref >60)
Glucose, Bld: 111 mg/dL — ABNORMAL HIGH (ref 70–99)
Potassium: 3.4 mmol/L — ABNORMAL LOW (ref 3.5–5.1)
Sodium: 138 mmol/L (ref 135–145)

## 2022-12-30 ENCOUNTER — Ambulatory Visit (HOSPITAL_COMMUNITY)
Admission: RE | Admit: 2022-12-30 | Discharge: 2022-12-30 | Disposition: A | Discharge status: Home / Self Care | Payer: Medicare Other | Attending: Gastroenterology | Admitting: Gastroenterology

## 2022-12-30 ENCOUNTER — Encounter (HOSPITAL_COMMUNITY)
Admission: RE | Disposition: A | Discharge status: Home / Self Care | Payer: Self-pay | Source: Home / Self Care | Attending: Gastroenterology

## 2022-12-30 ENCOUNTER — Ambulatory Visit (HOSPITAL_COMMUNITY): Payer: Medicare Other | Admitting: Anesthesiology

## 2022-12-30 ENCOUNTER — Ambulatory Visit (HOSPITAL_BASED_OUTPATIENT_CLINIC_OR_DEPARTMENT_OTHER): Payer: Medicare Other | Admitting: Anesthesiology

## 2022-12-30 DIAGNOSIS — I2510 Atherosclerotic heart disease of native coronary artery without angina pectoris: Secondary | ICD-10-CM | POA: Diagnosis not present

## 2022-12-30 DIAGNOSIS — I10 Essential (primary) hypertension: Secondary | ICD-10-CM

## 2022-12-30 DIAGNOSIS — K449 Diaphragmatic hernia without obstruction or gangrene: Secondary | ICD-10-CM | POA: Diagnosis not present

## 2022-12-30 DIAGNOSIS — Z79899 Other long term (current) drug therapy: Secondary | ICD-10-CM | POA: Diagnosis not present

## 2022-12-30 DIAGNOSIS — K317 Polyp of stomach and duodenum: Secondary | ICD-10-CM

## 2022-12-30 DIAGNOSIS — Z87891 Personal history of nicotine dependence: Secondary | ICD-10-CM | POA: Diagnosis not present

## 2022-12-30 DIAGNOSIS — K219 Gastro-esophageal reflux disease without esophagitis: Secondary | ICD-10-CM

## 2022-12-30 DIAGNOSIS — J449 Chronic obstructive pulmonary disease, unspecified: Secondary | ICD-10-CM | POA: Insufficient documentation

## 2022-12-30 DIAGNOSIS — Z7951 Long term (current) use of inhaled steroids: Secondary | ICD-10-CM | POA: Diagnosis not present

## 2022-12-30 DIAGNOSIS — R053 Chronic cough: Secondary | ICD-10-CM | POA: Diagnosis not present

## 2022-12-30 DIAGNOSIS — K2100 Gastro-esophageal reflux disease with esophagitis, without bleeding: Secondary | ICD-10-CM | POA: Diagnosis not present

## 2022-12-30 DIAGNOSIS — D132 Benign neoplasm of duodenum: Secondary | ICD-10-CM

## 2022-12-30 HISTORY — PX: POLYPECTOMY: SHX5525

## 2022-12-30 HISTORY — PX: ESOPHAGOGASTRODUODENOSCOPY (EGD) WITH PROPOFOL: SHX5813

## 2022-12-30 SURGERY — ESOPHAGOGASTRODUODENOSCOPY (EGD) WITH PROPOFOL
Anesthesia: General

## 2022-12-30 MED ORDER — PROPOFOL 10 MG/ML IV BOLUS
INTRAVENOUS | Status: DC | PRN
  Administered 2022-12-30: 50 mg via INTRAVENOUS

## 2022-12-30 MED ORDER — PROPOFOL 500 MG/50ML IV EMUL
INTRAVENOUS | Status: DC | PRN
  Administered 2022-12-30: 150 ug/kg/min via INTRAVENOUS

## 2022-12-30 MED ORDER — LIDOCAINE 2% (20 MG/ML) 5 ML SYRINGE
INTRAMUSCULAR | Status: DC | PRN
  Administered 2022-12-30: 50 mg via INTRAVENOUS

## 2022-12-30 MED ORDER — LACTATED RINGERS IV SOLN: INTRAVENOUS | Status: DC

## 2022-12-30 NOTE — Anesthesia Postprocedure Evaluation (Signed)
Anesthesia Post Note  Patient: Tammy [name]  Procedure(s) Performed: ESOPHAGOGASTRODUODENOSCOPY (EGD) WITH PROPOFOL POLYPECTOMY  Patient location during evaluation: Phase II Anesthesia Type: General Level of consciousness: awake and alert and oriented Pain management: pain level controlled Vital Signs Assessment: post-procedure vital signs reviewed and stable Respiratory status: spontaneous breathing, nonlabored ventilation and respiratory function stable Cardiovascular status: blood pressure returned to baseline and stable Postop Assessment: no apparent nausea or vomiting Anesthetic complications: no  No notable events documented.   Last Vitals:  Vitals:   12/30/22 1120 12/30/22 1126  BP: (!) 90/45 98/64  Pulse: 83   Resp: 19   Temp: 36.8 C   SpO2: 94%     Last Pain:  Vitals:   12/30/22 1120  TempSrc: Oral  PainSc: Asleep                 [name] C Jacqulyne Gladue

## 2022-12-30 NOTE — Interval H&P Note (Signed)
History and Physical Interval Note:  12/30/2022 9:41 AM  Tammy [name]  has presented today for surgery, with the diagnosis of persistent cough.  The various methods of treatment have been discussed with the patient and family. After consideration of risks, benefits and other options for treatment, the patient has consented to  Procedure(s) with comments: ESOPHAGOGASTRODUODENOSCOPY (EGD) WITH PROPOFOL (N/A) - 10:45 am as a surgical intervention.  The patient's history has been reviewed, patient examined, no change in status, stable for surgery.  I have reviewed the patient's chart and labs.  Questions were answered to the patient's satisfaction.     [name]

## 2022-12-30 NOTE — Discharge Instructions (Addendum)
You are being discharged to home.  Resume your previous diet.  Continue your present medications.  Recommend proceeding with pH impedance testing on PPI and manometry.

## 2022-12-30 NOTE — Anesthesia Preprocedure Evaluation (Signed)
Anesthesia Evaluation  Patient identified by MRN, date of birth, ID band Patient awake    Reviewed: Allergy & Precautions, H&P , NPO status , Patient's Chart, lab work & pertinent test results  Airway Mallampati: II  TM Distance: >3 FB Neck ROM: Full    Dental  (+) Dental Advisory Given, Missing   Pulmonary asthma , former smoker   Pulmonary exam normal breath sounds clear to auscultation       Cardiovascular Exercise Tolerance: Good hypertension, Pt. on medications + CAD  Normal cardiovascular exam Rhythm:Regular Rate:Normal     Neuro/Psych negative neurological ROS  negative psych ROS   GI/Hepatic Neg liver ROS,GERD  Medicated and Poorly Controlled,,  Endo/Other  negative endocrine ROS    Renal/GU negative Renal ROS  negative genitourinary   Musculoskeletal negative musculoskeletal ROS (+)    Abdominal   Peds negative pediatric ROS (+)  Hematology negative hematology ROS (+)   Anesthesia Other Findings   Reproductive/Obstetrics negative OB ROS                             Anesthesia Physical Anesthesia Plan  ASA: 3  Anesthesia Plan: General   Post-op Pain Management: Minimal or no pain anticipated   Induction:   PONV Risk Score and Plan: Propofol infusion  Airway Management Planned: Nasal Cannula and Natural Airway  Additional Equipment:   Intra-op Plan:   Post-operative Plan:   Informed Consent: I have reviewed the patients History and Physical, chart, labs and discussed the procedure including the risks, benefits and alternatives for the proposed anesthesia with the patient or authorized representative who has indicated his/her understanding and acceptance.     Dental advisory given  Plan Discussed with: CRNA and Surgeon  Anesthesia Plan Comments:         Anesthesia Quick Evaluation

## 2022-12-30 NOTE — Op Note (Signed)
[facility] Patient Name: Tammy [name] Procedure Date: 12/30/2022 10:41 AM MRN: [id number] Date of Birth: 1945/12/05 Attending MD: Maylon Peppers , , [id number] CSN: [id number] Age: 77 Admit Type: Outpatient Procedure:                Upper GI endoscopy Indications:              Follow-up of gastro-esophageal reflux disease,                            Chronic cough Providers:                Maylon Peppers, Caprice Kluver, Ladoris Gene                            Technician, Technician Referring MD:              Medicines:                Monitored Anesthesia Care Complications:            No immediate complications. Estimated Blood Loss:     Estimated blood loss: none. Procedure:                Pre-Anesthesia Assessment:                           - Prior to the procedure, a History and Physical                            was performed, and patient medications, allergies                            and sensitivities were reviewed. The patient's                            tolerance of previous anesthesia was reviewed.                           - The risks and benefits of the procedure and the                            sedation options and risks were discussed with the                            patient. All questions were answered and informed                            consent was obtained.                           After obtaining informed consent, the endoscope was                            passed under direct vision. Throughout the                            procedure, the patient's blood pressure, pulse, and  oxygen saturations were monitored continuously. The                            GIF-H190 ([id number]) scope was introduced through the                            mouth, and advanced to the second part of duodenum.                            The upper GI endoscopy was accomplished without                            difficulty. The patient tolerated the  procedure                            well. Scope In: 11:03:23 AM Scope Out: 11:13:39 AM Total Procedure Duration: 0 hours 10 minutes 16 seconds  Findings:      A 2 cm hiatal hernia was present.      The gastroesophageal flap valve was visualized endoscopically and       classified as Hill Grade III (minimal fold, loose to endoscope, hiatal       hernia likely).      The entire examined stomach was normal.      A single 3 mm semi-sessile polyp with no bleeding was found in the       second portion of the duodenum. The polyp was removed with a cold snare.       Resection was complete, but the polyp tissue was not retrieved. Impression:               - 2 cm hiatal hernia.                           - Normal stomach.                           - A single duodenal polyp. Complete resection.                            Polyp tissue not retrieved. Moderate Sedation:      Per Anesthesia Care Recommendation:           - Discharge patient to home (ambulatory).                           - Resume previous diet.                           - Continue present medications.                           - Recommend proceeding with pH impedance testing on                            PPI and manometry.                           - May consider cTIF if  testing positive for GERD. Procedure Code(s):        --- Professional ---                           [phone], Esophagogastroduodenoscopy, flexible,                            transoral; with removal of tumor(s), polyp(s), or                            other lesion(s) by snare technique Diagnosis Code(s):        --- Professional ---                           K44.9, Diaphragmatic hernia without obstruction or                            gangrene                           K31.7, Polyp of stomach and duodenum                           K21.9, Gastro-esophageal reflux disease without                            esophagitis                           R05.3, Chronic  cough CPT copyright 2022 American Medical Association. All rights reserved. The codes documented in this report are preliminary and upon coder review may  be revised to meet current compliance requirements. Maylon Peppers, MD Maylon Peppers,  12/30/2022 11:24:20 AM This report has been signed electronically. Number of Addenda: 0

## 2022-12-30 NOTE — Transfer of Care (Signed)
Immediate Anesthesia Transfer of Care Note  Patient: Tammy [name]  Procedure(s) Performed: ESOPHAGOGASTRODUODENOSCOPY (EGD) WITH PROPOFOL POLYPECTOMY  Patient Location: PACU  Anesthesia Type:General  Level of Consciousness: awake  Airway & Oxygen Therapy: Patient Spontanous Breathing  Post-op Assessment: Report given to RN and Post -op Vital signs reviewed and stable  Post vital signs: Reviewed and stable  Last Vitals:  Vitals Value Taken Time  BP    Temp    Pulse    Resp    SpO2      Last Pain:  Vitals:   12/30/22 1058  TempSrc:   PainSc: 0-No pain      Patients Stated Pain Goal: 6 (43/14/27 6701)  Complications: No notable events documented.

## 2023-01-06 ENCOUNTER — Encounter (HOSPITAL_COMMUNITY): Payer: Self-pay | Admitting: Gastroenterology

## 2023-01-11 ENCOUNTER — Telehealth (INDEPENDENT_AMBULATORY_CARE_PROVIDER_SITE_OTHER): Payer: Self-pay | Admitting: *Deleted

## 2023-01-11 DIAGNOSIS — R053 Chronic cough: Secondary | ICD-10-CM

## 2023-01-11 NOTE — Telephone Encounter (Signed)
Patient left message on my phone, she rec'd call from [facility] about scheduling EM/pH and she doesn't understand why she needs to go, can you please call and discuss with her. thanks  Ph# [phone]

## 2023-01-11 NOTE — Telephone Encounter (Signed)
I called the patient to answer her questions but she did not pick up the phone.  I left a detailed voice message explaining the reasoning why we had to proceed with a pH impedance study, if she has any more questions she can call to our office and we can clarify this further.

## 2023-01-11 NOTE — Telephone Encounter (Signed)
Referral placed in epic for [facility]

## 2023-01-11 NOTE — Addendum Note (Signed)
Addended by: Worthy Keeler on: 01/11/2023 04:39 PM   Modules accepted: Orders

## 2023-01-11 NOTE — Telephone Encounter (Signed)
Patient wants to have the pH impedance on PPI performed at [facility] instead. Please send a referral to [place] for this. No need for esophageal manometry. Please schedule her for a barium esophagram. [id number] out reflux.  Thanks

## 2023-01-17 ENCOUNTER — Encounter: Payer: Self-pay | Admitting: Physician Assistant

## 2023-01-17 NOTE — Progress Notes (Addendum)
Cardiology Office Note    Date:  01/18/2023   ID:  Tammy [name], Tammy [name] 1946/04/27, MRN [id number]  PCP:  Benita Stabile, MD  Cardiologist:  Donato Schultz, MD  Electrophysiologist:  None   Chief Complaint:  cardiac f/u, CP, SOB   History of Present Illness:   Tammy [name] is a 77 y.o. female with history of HTN, asthma (diagnosis in question per pulmonology note), 3v coronary calcification on CT with prior suspected type 2 MI, PVCs, PAT, sinus pause, mild HLD, former tobacco use seen for follow-up. She had a remote nuc low risk in 2018, hypertensive response to exercise. She was admitted 05/2022 with severe respiratory distress and coughing requiring supplemental O2; clinical presentation c/w asthma exacerbation. Initial CTA raised question of small filling defect, repeat CTA could not re-evaluate this area. LE venous duplex negative. Pulmonology on board as primary presentation felt pulmonary. Cardiology consulted for elevated troponin peak 2,438. 2D echo 05/2022 EF 70-75%, hyperdynamic function, mild LVH, G1DD, mildly elevated PASP, aortic sclerosis without stenosis, dilated IVC. Elevated troponin was felt due to type 2 MI/demand ischemia. Admission also notable for hypokalemia, mild AKI, rare PVCs, brief atrial tach and rare sinus pause without sustained events. Repeat eval for PE felt that PE was ruled out. Covid/flu/RVP panel were negative. Losartan-HCTZ was changed to valsartan-HCTZ. She was seen by Dr. Anne Fu in clinic 06/2022 who recommended continued medical therapy, no invasive evaluation planned. He added Crestor daily instead of 2 days a week. She has since followed up with pulmonology due to ongoing cough issue whose note indicates he does not think she has asthma. She has been on high dose PPI suppression for concern this may be acid reflux instead and underwent EGD that was fairly unrevealing earlier this month, 2cm polyp and hiatal hernia otherwise normal.   She is seen back for follow-up  today reporting that her chronic cough continues. She reports some dyspnea on exertion that has been going on since her prior hospitalization, endurance and energy level generally down. She reports some bendopnea. She also reports some vague chest pain/tightness about 1x/week lasting 3-5 minutes without provocation. She does not necessarily feel this with exertion. She is frustrated that her chronic dry cough continues. Laying back causes her to cough as well. This has not improved with PPI. We discussed further ischemic testing and she would like to pursue cor CT, will see if she is able to tolerate lying back for the study.   Labwork independently reviewed: 12/28/22 K 3.4, Cr 1.01 06/2022 WBC 12.3, Hgb 12.8, plt ok 05/2022 LDL 109, trig 79, Mg 2.4, AST up, ALT wnl   Past History   Past Medical History:  Diagnosis Date   Asthma    Coronary artery calcification seen on CT scan    Hyperlipidemia    Hypertension    Mild pulmonary hypertension (HCC)    PAT (paroxysmal atrial tachycardia)    PVC's (premature ventricular contractions)    Sinus pause    Type 2 MI (myocardial infarction) [facility])     Past Surgical History:  Procedure Laterality Date   ABDOMINAL HYSTERECTOMY  1988   CATARACT EXTRACTION W/PHACO Right 09/27/2019   Procedure: CATARACT EXTRACTION PHACO AND INTRAOCULAR LENS PLACEMENT (IOC) (CDE: 4.94);  Surgeon: Fabio Pierce, MD;  Location: [facility] OR[facility];  Service: Ophthalmology;  Laterality: Right;   CATARACT EXTRACTION W/PHACO Left 10/18/2019   Procedure: CATARACT EXTRACTION PHACO AND INTRAOCULAR LENS PLACEMENT (IOC);  Surgeon: Fabio Pierce, MD;  Location: [facility] OR[facility];  Service: Ophthalmology;  Laterality: Left;  CDE: 7.18   COLONOSCOPY N/A 08/11/2016   Procedure: COLONOSCOPY;  Surgeon: Malissa Hippo, MD;  Location: [facility];  Service: Endoscopy;  Laterality: N/A;  730   ELBOW FRACTURE SURGERY     ESOPHAGOGASTRODUODENOSCOPY (EGD) WITH PROPOFOL N/A 12/30/2022   Procedure:  ESOPHAGOGASTRODUODENOSCOPY (EGD) WITH PROPOFOL;  Surgeon: Dolores Frame, MD;  Location: [facility];  Service: Gastroenterology;  Laterality: N/A;  10:45 am   KNEE ARTHROSCOPY     POLYPECTOMY  12/30/2022   Procedure: POLYPECTOMY;  Surgeon: Dolores Frame, MD;  Location: [facility];  Service: Gastroenterology;;   SHOULDER OPEN ROTATOR CUFF REPAIR Right 10/06/2016   Procedure: OPEN ROTATOR CUFF REPAIR RIGHT SHOULDER;  Surgeon: Vickki Hearing, MD;  Location: [facility];  Service: Orthopedics;  Laterality: Right;    Current Medications: Current Meds  Medication Sig   acetaminophen (TYLENOL) 500 MG tablet Take 500 mg by mouth every 6 (six) hours as needed (pain).   albuterol (PROVENTIL) (2.5 MG/3ML) 0.083% nebulizer solution Take 3 mLs (2.5 mg total) by nebulization every 4 (four) hours as needed for wheezing or shortness of breath.   albuterol (VENTOLIN HFA) 108 (90 Base) MCG/ACT inhaler Inhale 2 puffs into the lungs every 4 (four) hours as needed for wheezing or shortness of breath.   benzonatate (TESSALON) 200 MG capsule Take 1 capsule (200 mg total) by mouth 3 (three) times daily as needed for cough.   chlorpheniramine (CHLOR-TRIMETON) 4 MG tablet Take 4 mg by mouth every 4 (four) hours as needed for allergies.   ipratropium (ATROVENT) 0.03 % nasal spray Place 2 sprays into both nostrils 3 (three) times daily. (Patient taking differently: Place 2 sprays into both nostrils in the morning and at bedtime.)   montelukast (SINGULAIR) 10 MG tablet Take 10 mg by mouth at bedtime.   omeprazole (PRILOSEC) 40 MG capsule Take 1 capsule (40 mg total) by mouth 2 (two) times daily.   rosuvastatin (CRESTOR) 5 MG tablet Take 1 tablet (5 mg total) by mouth daily. (Patient taking differently: Take 5 mg by mouth every evening.)   valsartan-hydrochlorothiazide (DIOVAN-HCT) 160-25 MG tablet TAKE 1 TABLET BY MOUTH EVERY DAY     Allergies:   Patient has no known allergies.   Social History    Socioeconomic History   Marital status: Widowed    Spouse name: Not on file   Number of children: Not on file   Years of education: Not on file   Highest education level: Not on file  Occupational History   Not on file  Tobacco Use   Smoking status: Former    Packs/day: 0.75    Years: 15.00    Total pack years: 11.25    Types: Cigarettes    Quit date: 11/28/1983    Years since quitting: 39.1   Smokeless tobacco: Never  Vaping Use   Vaping Use: Never used  Substance and Sexual Activity   Alcohol use: No   Drug use: No   Sexual activity: Not Currently  Other Topics Concern   Not on file  Social History Narrative   Not on file   Social Determinants of Health   Financial Resource Strain: Not on file  Food Insecurity: Not on file  Transportation Needs: Not on file  Physical Activity: Not on file  Stress: Not on file  Social Connections: Not on file     Family History:  The patient's family history includes Asthma in her father; Cancer in  her maternal grandmother; Eczema in her father.  ROS:   Please see the history of present illness.  All other systems are reviewed and otherwise negative.    EKG(s)/Additional Testing   EKG:  EKG is ordered today, personally reviewed, demonstrating NSR 84bpm, lower voltage QRS, baseline wander, no acute STT changes. QTc wnl.   CV Studies: Cardiac studies reviewed are outlined and summarized above. Otherwise please see EMR for full report.  Recent Labs: 06/01/2022: B Natriuretic Peptide 93.0 06/02/2022: ALT 28 06/03/2022: Magnesium 2.4 07/12/2022: Hemoglobin 12.8; Platelets 450 12/28/2022: BUN 16; Creatinine, Ser 1.01; Potassium 3.4; Sodium 138  Recent Lipid Panel    Component Value Date/Time   CHOL 177 06/03/2022 0411   TRIG 79 06/03/2022 0411   HDL 52 06/03/2022 0411   CHOLHDL 3.4 06/03/2022 0411   VLDL 16 06/03/2022 0411   LDLCALC 109 (H) 06/03/2022 0411    PHYSICAL EXAM:    VS:  BP 126/84   Pulse 87   Ht 5\' 5"  (1.651  m)   Wt 182 lb 9.6 oz (82.8 kg)   SpO2 99%   BMI 30.39 kg/m   BMI: Body mass index is 30.39 kg/m.  GEN: Well nourished, well developed female in no acute distress HEENT: normocephalic, atraumatic Neck: no JVD, carotid bruits, or masses Cardiac: RRR; no murmurs, rubs, or gallops, no edema. Sporadic dry coughing Respiratory:  clear to auscultation bilaterally, normal work of breathing GI: soft, nontender, nondistended, + BS MS: no deformity or atrophy Skin: warm and dry, no rash Neuro:  Alert and Oriented x 3, Strength and sensation are intact, follows commands Psych: euthymic mood, full affect  Wt Readings from Last 3 Encounters:  01/18/23 182 lb 9.6 oz (82.8 kg)  12/28/22 178 lb (80.7 kg)  12/08/22 184 lb 8 oz (83.7 kg)     ASSESSMENT & PLAN:   1. Coronary calcification with prior type 2 MI, HLD, reporting some DOE and precordial pain - discussed various modalities of investigation for CP and SOB. Ideally would like to pursue coronary CTA to quantify coronary atherosclerosis burden. Will avoid BB given her h/o asthma/bronchospasm and use ivrabradine instead. Will update labs today including BNP given her reports of bendopnea and coughing with recumbency. She is contemplating seeking second opinion for her spastic cough. This was not previously felt cardiac related on the dry hacky pervasive nature. Will also add baby ASA 81mg  daily given prior elevated troponin and atherosclerosis - denies any prior issues taking this. Will continue rosuvastatin. She ate today so will get fasting lipids when she returns for CT.  2. PVCs, brief PAT, sinus pause in 2023 - quiescent by symptoms.  3. Essential HTN - BP stable, no additional changes made today. Pulm changed her ARBs but does not seem this has made a difference in her cough.  4. Mild pulm HTN with diastolic dysfunction - obtained in context of pulmonary stressors. Check BNP with labs above.    Disposition: F/u with me in 4-6  weeks.   Medication Adjustments/Labs and Tests Ordered: Current medicines are reviewed at length with the patient today.  Concerns regarding medicines are outlined above. Medication changes, Labs and Tests ordered today are summarized above and listed in the Patient Instructions accessible in Encounters.    Signed, Laurann Montana, PA-C  01/18/2023 1:59 PM    [facility] [place] Location in [facility] [address] [place], [place] [address] Ph: [phone]; Fax [phone]

## 2023-01-18 ENCOUNTER — Ambulatory Visit (INDEPENDENT_AMBULATORY_CARE_PROVIDER_SITE_OTHER): Payer: Medicare Other | Admitting: Physician Assistant

## 2023-01-18 ENCOUNTER — Other Ambulatory Visit (HOSPITAL_COMMUNITY)
Admission: RE | Admit: 2023-01-18 | Discharge: 2023-01-18 | Disposition: A | Discharge status: Home / Self Care | Payer: Medicare Other | Source: Ambulatory Visit | Attending: Physician Assistant | Admitting: Physician Assistant

## 2023-01-18 ENCOUNTER — Encounter: Payer: Self-pay | Admitting: Physician Assistant

## 2023-01-18 VITALS — BP 126/84 | HR 87 | Ht 65.0 in | Wt 182.6 lb

## 2023-01-18 DIAGNOSIS — I2510 Atherosclerotic heart disease of native coronary artery without angina pectoris: Secondary | ICD-10-CM

## 2023-01-18 DIAGNOSIS — R0609 Other forms of dyspnea: Secondary | ICD-10-CM | POA: Diagnosis not present

## 2023-01-18 DIAGNOSIS — E785 Hyperlipidemia, unspecified: Secondary | ICD-10-CM | POA: Diagnosis not present

## 2023-01-18 DIAGNOSIS — I493 Ventricular premature depolarization: Secondary | ICD-10-CM

## 2023-01-18 DIAGNOSIS — I4719 Other supraventricular tachycardia: Secondary | ICD-10-CM

## 2023-01-18 DIAGNOSIS — I10 Essential (primary) hypertension: Secondary | ICD-10-CM | POA: Diagnosis not present

## 2023-01-18 DIAGNOSIS — I2584 Coronary atherosclerosis due to calcified coronary lesion: Secondary | ICD-10-CM | POA: Diagnosis not present

## 2023-01-18 DIAGNOSIS — I455 Other specified heart block: Secondary | ICD-10-CM | POA: Diagnosis not present

## 2023-01-18 DIAGNOSIS — R072 Precordial pain: Secondary | ICD-10-CM

## 2023-01-18 DIAGNOSIS — I272 Pulmonary hypertension, unspecified: Secondary | ICD-10-CM

## 2023-01-18 LAB — CBC
HCT: 42.5 % (ref 36.0–46.0)
Hemoglobin: 13.3 g/dL (ref 12.0–15.0)
MCH: 25.1 pg — ABNORMAL LOW (ref 26.0–34.0)
MCHC: 31.3 g/dL (ref 30.0–36.0)
MCV: 80.2 fL (ref 80.0–100.0)
Platelets: 316 10*3/uL (ref 150–400)
RBC: 5.3 MIL/uL — ABNORMAL HIGH (ref 3.87–5.11)
RDW: 14.2 % (ref 11.5–15.5)
WBC: 8.6 10*3/uL (ref 4.0–10.5)
nRBC: 0 % (ref 0.0–0.2)

## 2023-01-18 LAB — TSH: TSH: 2.064 u[IU]/mL (ref 0.350–4.500)

## 2023-01-18 LAB — COMPREHENSIVE METABOLIC PANEL
ALT: 14 U/L (ref 0–44)
AST: 21 U/L (ref 15–41)
Albumin: 4.1 g/dL (ref 3.5–5.0)
Alkaline Phosphatase: 40 U/L (ref 38–126)
Anion gap: 8 (ref 5–15)
BUN: 18 mg/dL (ref 8–23)
CO2: 28 mmol/L (ref 22–32)
Calcium: 9.3 mg/dL (ref 8.9–10.3)
Chloride: 100 mmol/L (ref 98–111)
Creatinine, Ser: 0.86 mg/dL (ref 0.44–1.00)
GFR, Estimated: >60 mL/min (ref >60)
Glucose, Bld: 110 mg/dL — ABNORMAL HIGH (ref 70–99)
Potassium: 3.6 mmol/L (ref 3.5–5.1)
Sodium: 136 mmol/L (ref 135–145)
Total Bilirubin: 0.5 mg/dL (ref 0.3–1.2)
Total Protein: 7.1 g/dL (ref 6.5–8.1)

## 2023-01-18 LAB — BRAIN NATRIURETIC PEPTIDE: B Natriuretic Peptide: 46 pg/mL (ref 0.0–100.0)

## 2023-01-18 MED ORDER — IVABRADINE HCL 7.5 MG PO TABS: 15.0000 mg | ORAL_TABLET | ORAL | 0 refills | Status: DC

## 2023-01-18 MED ORDER — ASPIRIN 81 MG PO TBEC: 81.0000 mg | DELAYED_RELEASE_TABLET | Freq: Every day | ORAL | 3 refills | Status: AC

## 2023-01-18 NOTE — Patient Instructions (Signed)
Medication Instructions:   Start Aspirin 81 mg daily   Take Ivabradine 15 mg ( 2 Tablets) two hours prior to CT scan   *If you need a refill on your cardiac medications before your next appointment, please call your pharmacy*   Lab Work: Your physician recommends that you return for lab work in: Today ( CMET, BNP, CBC, TSH)  Your physician recommends that you return for lab work fasting ( Lipids)    If you have labs (blood work) drawn today and your tests are completely normal, you will receive your results only by: [place] (if you have [place]) OR A paper copy in the mail If you have any lab test that is abnormal or we need to change your treatment, we will call you to review the results.   Testing/Procedures:   Your cardiac CT will be scheduled at one of the below locations:   [facility] [address] [place], [place] [address] [phone]) [place] [address] [place], [place] [address] [phone]  [facility] [place], [place] [address] [phone]  If scheduled at [facility], please arrive at the [facility] Entrance (Entrance C2) of [facility] 30 minutes prior to test start time. You can use the FREE valet parking offered at entrance C (encouraged to control the heart rate for the test)  Proceed to the [facility] Radiology Department (first floor) to check-in and test prep.  All radiology patients and guests should use entrance C2 at [facility], accessed from [facility], even though the hospital's physical address listed is [address].    If scheduled at [facility] or [facility], please arrive 15 mins early for check-in and test prep.   Please follow these instructions carefully (unless otherwise directed):  Hold all  erectile dysfunction medications at least 3 days (72 hrs) prior to test. (Ie viagra, cialis, sildenafil, tadalafil, etc) We will administer nitroglycerin during this exam.   On the Night Before the Test: Be sure to Drink plenty of water. Do not consume any caffeinated/decaffeinated beverages or chocolate 12 hours prior to your test. Do not take any antihistamines 12 hours prior to your test. If the patient has contrast allergy: Patient will need a prescription for Prednisone and very clear instructions (as follows): Prednisone 50 mg - take 13 hours prior to test Take another Prednisone 50 mg 7 hours prior to test Take another Prednisone 50 mg 1 hour prior to test Take Benadryl 50 mg 1 hour prior to test Patient must complete all four doses of above prophylactic medications. Patient will need a ride after test due to Benadryl.  On the Day of the Test: Drink plenty of water until 1 hour prior to the test. Do not eat any food 1 hour prior to test. You may take your regular medications prior to the test.  Take metoprolol (Lopressor) two hours prior to test. If you take Furosemide/Hydrochlorothiazide/Spironolactone, please HOLD on the morning of the test. FEMALES- please wear underwire-free bra if available, avoid dresses & tight clothing   *For Clinical Staff only. Please instruct patient the following:* Heart Rate Medication Recommendations for Cardiac CT  Resting HR < 50 bpm  No medication  Resting HR 50-60 bpm and BP >110/50 mmHG   Consider Metoprolol tartrate 25 mg PO 90-120 min prior to scan  Resting HR 60-65 bpm and BP >110/50 mmHG  Metoprolol tartrate 50 mg PO 90-120 minutes prior to scan   Resting HR > 65 bpm and BP >110/50 mmHG  Metoprolol tartrate 100 mg PO 90-120 minutes prior to scan  Consider Ivabradine 10-15 mg PO or a calcium channel blocker for resting HR >60 bpm and contraindication to metoprolol tartrate  Consider Ivabradine 10-15 mg PO in combination with metoprolol  tartrate for HR >80 bpm         After the Test: Drink plenty of water. After receiving IV contrast, you may experience a mild flushed feeling. This is normal. On occasion, you may experience a mild rash up to 24 hours after the test. This is not dangerous. If this occurs, you can take Benadryl 25 mg and increase your fluid intake. If you experience trouble breathing, this can be serious. If it is severe call 911 IMMEDIATELY. If it is mild, please call our office. If you take any of these medications: Glipizide/Metformin, Avandament, Glucavance, please do not take 48 hours after completing test unless otherwise instructed.  We will call to schedule your test 2-4 weeks out understanding that some insurance companies will need an authorization prior to the service being performed.   For non-scheduling related questions, please contact the cardiac imaging nurse navigator should you have any questions/concerns: Marchia Bond, Cardiac Imaging Nurse Navigator Gordy Clement, Cardiac Imaging Nurse Navigator [facility] Direct Office Dial: [phone]   For scheduling needs, including cancellations and rescheduling, please call [place], [phone].    Follow-Up: At [facility], you and your health needs are our priority.  As part of our continuing mission to provide you with exceptional heart care, we have created designated Provider Care Teams.  These Care Teams include your primary Cardiologist (physician) and Advanced Practice Providers (APPs -  Physician Assistants and Nurse Practitioners) who all work together to provide you with the care you need, when you need it.  We recommend signing up for the patient portal called "MyChart".  Sign up information is provided on this After Visit Summary.  MyChart is used to connect with patients for Virtual Visits (Telemedicine).  Patients are able to view lab/test results, encounter notes, upcoming appointments, etc.   Non-urgent messages can be sent to your provider as well.   To learn more about what you can do with MyChart, go to [email or web address].    Your next appointment:   4 -6 week(s)  Provider:   Melina Copa, PA      Other Instructions Thank you for choosing [facility]!

## 2023-01-18 NOTE — Addendum Note (Signed)
Addended by: Levonne Hubert on: 01/18/2023 04:20 PM   Modules accepted: Orders

## 2023-01-19 ENCOUNTER — Telehealth: Payer: Self-pay

## 2023-01-19 ENCOUNTER — Other Ambulatory Visit (HOSPITAL_COMMUNITY)
Admission: RE | Admit: 2023-01-19 | Discharge: 2023-01-19 | Disposition: A | Discharge status: Home / Self Care | Payer: Medicare Other | Source: Ambulatory Visit | Attending: Physician Assistant | Admitting: Physician Assistant

## 2023-01-19 ENCOUNTER — Other Ambulatory Visit: Payer: Self-pay

## 2023-01-19 DIAGNOSIS — E785 Hyperlipidemia, unspecified: Secondary | ICD-10-CM | POA: Diagnosis not present

## 2023-01-19 DIAGNOSIS — Z79899 Other long term (current) drug therapy: Secondary | ICD-10-CM

## 2023-01-19 DIAGNOSIS — R053 Chronic cough: Secondary | ICD-10-CM

## 2023-01-19 DIAGNOSIS — K219 Gastro-esophageal reflux disease without esophagitis: Secondary | ICD-10-CM

## 2023-01-19 LAB — LIPID PANEL
Cholesterol: 108 mg/dL (ref 0–200)
HDL: 39 mg/dL — ABNORMAL LOW (ref >40)
LDL Cholesterol: 55 mg/dL (ref 0–99)
Total CHOL/HDL Ratio: 2.8 RATIO
Triglycerides: 70 mg/dL (ref <150)
VLDL: 14 mg/dL (ref 0–40)

## 2023-01-19 MED ORDER — POTASSIUM CHLORIDE CRYS ER 20 MEQ PO TBCR: 20.0000 meq | EXTENDED_RELEASE_TABLET | Freq: Every day | ORAL | 3 refills | Status: DC

## 2023-01-19 NOTE — Telephone Encounter (Signed)
Lab results discussed with patient. She will begin potassium 20 mg daily and repeat BMET in 1 week at [facility].PCP copied.

## 2023-01-19 NOTE — Telephone Encounter (Signed)
-----   Message from Charlie Pitter, [place] sent at 01/18/2023  5:47 PM EST ----- Labs unrevealing except K remains low end of normal. BNP is totally normal arguing against component of fluid retention. Given HCTZ use, recommend adding KCl 35mq daily and add on f/u BMET when she gets the lipid panel done with cor CT. Otherwise continue plan as discussed.

## 2023-01-19 NOTE — Telephone Encounter (Signed)
Patient had Lipid done today so will enter order for BMET after she has been on the potassium.   Left message for patient to return call.

## 2023-01-19 NOTE — Telephone Encounter (Signed)
-----   Message from Charlie Pitter, [place] sent at 01/18/2023  5:47 PM EST ----- Labs unrevealing except K remains low end of normal. BNP is totally normal arguing against component of fluid retention. Given HCTZ use, recommend adding KCl 63mq daily and add on f/u BMET when she gets the lipid panel done with cor CT. Otherwise continue plan as discussed.

## 2023-01-25 ENCOUNTER — Other Ambulatory Visit (HOSPITAL_COMMUNITY)
Admission: RE | Admit: 2023-01-25 | Discharge: 2023-01-25 | Disposition: A | Discharge status: Home / Self Care | Payer: Medicare Other | Source: Ambulatory Visit | Attending: Physician Assistant | Admitting: Physician Assistant

## 2023-01-25 DIAGNOSIS — Z79899 Other long term (current) drug therapy: Secondary | ICD-10-CM | POA: Insufficient documentation

## 2023-01-25 LAB — BASIC METABOLIC PANEL
Anion gap: 8 (ref 5–15)
BUN: 20 mg/dL (ref 8–23)
CO2: 26 mmol/L (ref 22–32)
Calcium: 9.1 mg/dL (ref 8.9–10.3)
Chloride: 102 mmol/L (ref 98–111)
Creatinine, Ser: 0.93 mg/dL (ref 0.44–1.00)
GFR, Estimated: >60 mL/min (ref >60)
Glucose, Bld: 101 mg/dL — ABNORMAL HIGH (ref 70–99)
Potassium: 3.8 mmol/L (ref 3.5–5.1)
Sodium: 136 mmol/L (ref 135–145)

## 2023-01-26 ENCOUNTER — Telehealth: Payer: Self-pay

## 2023-01-26 DIAGNOSIS — Z79899 Other long term (current) drug therapy: Secondary | ICD-10-CM

## 2023-01-26 MED ORDER — POTASSIUM CHLORIDE CRYS ER 20 MEQ PO TBCR: 40.0000 meq | EXTENDED_RELEASE_TABLET | Freq: Every day | ORAL | 3 refills | Status: DC

## 2023-01-26 NOTE — Telephone Encounter (Signed)
Patient notified and verbalized understanding. Patient had no questions or concerns at this time. PCP copied.  

## 2023-01-26 NOTE — Telephone Encounter (Signed)
-----   Message from Nuala Alpha, LPN sent at [id number]  8:28 AM EST ----- Linna Hoff pt  ----- Message ----- From: Felipa Evener Sent: 01/25/2023   3:38 PM EST To: [facility] Triage; [place] triage - disregard prior message. This was meant to go to [place] triage so labs would be done in [place]. [place] team - Please confirm patient started KCl 89mq daily. Potassium did improve slightly to 3.8 but not entirely at goal of 4.0 with h/o PVCs. Recommend increase KCl to 456m daily with recheck BMET in  1 week. Make sure to take KCl day of labs at least 2-3 hours beforehand. Note only: Prior magnesium was normal.

## 2023-01-29 ENCOUNTER — Other Ambulatory Visit (INDEPENDENT_AMBULATORY_CARE_PROVIDER_SITE_OTHER): Payer: Self-pay | Admitting: Gastroenterology

## 2023-01-30 ENCOUNTER — Telehealth (HOSPITAL_COMMUNITY): Payer: Self-pay | Admitting: *Deleted

## 2023-01-30 NOTE — Telephone Encounter (Signed)
Attempted to call patient regarding upcoming cardiac CT appointment. °Left message on voicemail with name and callback number ° °Ameilia Rattan RN Navigator Cardiac Imaging °[facility] °[phone] Office °[phone] Cell ° °

## 2023-01-30 NOTE — Telephone Encounter (Signed)
Patient returning call about her upcoming cardiac imaging study; pt verbalizes understanding of appt date/time, parking situation and where to check in, pre-test NPO status and medications ordered, and verified current allergies; name and call back number provided for further questions should they arise  Gordy Clement RN Navigator Cardiac Imaging [facility] [phone] office [phone] cell  Patient to take '15mg'$  ivabradine two hours prior to her cardiac CT scan. She is aware to arrive at 7:30am.

## 2023-02-01 ENCOUNTER — Ambulatory Visit (HOSPITAL_COMMUNITY)
Admission: RE | Admit: 2023-02-01 | Discharge: 2023-02-01 | Disposition: A | Discharge status: Home / Self Care | Payer: Medicare Other | Source: Ambulatory Visit | Attending: Physician Assistant | Admitting: Physician Assistant

## 2023-02-01 DIAGNOSIS — R0609 Other forms of dyspnea: Secondary | ICD-10-CM | POA: Insufficient documentation

## 2023-02-01 DIAGNOSIS — R072 Precordial pain: Secondary | ICD-10-CM | POA: Diagnosis not present

## 2023-02-01 MED ORDER — DILTIAZEM HCL 25 MG/5ML IV SOLN: 10.0000 mg | Freq: Once | INTRAVENOUS | Status: AC

## 2023-02-01 MED ORDER — NITROGLYCERIN 0.4 MG SL SUBL
SUBLINGUAL_TABLET | SUBLINGUAL | Status: AC
  Administered 2023-02-01: 0.8 mg via SUBLINGUAL
  Filled 2023-02-01: qty 2

## 2023-02-01 MED ORDER — IOHEXOL 350 MG/ML SOLN
100.0000 mL | Freq: Once | INTRAVENOUS | Status: AC | PRN
  Administered 2023-02-01: 100 mL via INTRAVENOUS

## 2023-02-01 MED ORDER — NITROGLYCERIN 0.4 MG SL SUBL: 0.8000 mg | SUBLINGUAL_TABLET | Freq: Once | SUBLINGUAL | Status: AC

## 2023-02-01 MED ORDER — DILTIAZEM HCL 25 MG/5ML IV SOLN
INTRAVENOUS | Status: AC
  Administered 2023-02-01: 10 mg via INTRAVENOUS
  Filled 2023-02-01: qty 5

## 2023-02-03 ENCOUNTER — Other Ambulatory Visit (HOSPITAL_COMMUNITY)
Admission: RE | Admit: 2023-02-03 | Discharge: 2023-02-03 | Disposition: A | Discharge status: Home / Self Care | Payer: Medicare Other | Source: Ambulatory Visit | Attending: Physician Assistant | Admitting: Physician Assistant

## 2023-02-03 ENCOUNTER — Other Ambulatory Visit (HOSPITAL_COMMUNITY): Payer: Medicare Other

## 2023-02-03 ENCOUNTER — Telehealth: Payer: Self-pay

## 2023-02-03 DIAGNOSIS — Z79899 Other long term (current) drug therapy: Secondary | ICD-10-CM | POA: Insufficient documentation

## 2023-02-03 LAB — BASIC METABOLIC PANEL
Anion gap: 9 (ref 5–15)
BUN: 18 mg/dL (ref 8–23)
CO2: 27 mmol/L (ref 22–32)
Calcium: 9.1 mg/dL (ref 8.9–10.3)
Chloride: 100 mmol/L (ref 98–111)
Creatinine, Ser: 1.02 mg/dL — ABNORMAL HIGH (ref 0.44–1.00)
GFR, Estimated: 56 mL/min — ABNORMAL LOW (ref >60)
Glucose, Bld: 100 mg/dL — ABNORMAL HIGH (ref 70–99)
Potassium: 3.7 mmol/L (ref 3.5–5.1)
Sodium: 136 mmol/L (ref 135–145)

## 2023-02-03 NOTE — Telephone Encounter (Signed)
   Patient called back after hours for coronary CTA results. Reviewed CTA results with her. She states she continues to have some chest discomfort which she is wondering could be due to her persistent cough. Reassured her that based on these CTA results, chest pain likely non-cardiac in nature and recommended following up with PCP. She voiced understanding and thanked me for calling back.  Darreld Mclean, PA-C 02/03/2023 6:34 PM

## 2023-02-03 NOTE — Telephone Encounter (Signed)
This encounter was created in error - please disregard.

## 2023-02-03 NOTE — Telephone Encounter (Addendum)
Left voice message for patient to give office a call back for recent CT results.   ----- Message from Almyra Deforest, [place] sent at 02/02/2023  5:09 PM EST ----- Covering for Dayna Dunn's inbox. Very reassuring coronary CT result. Mild coronary artery disease noted which is not unexpected given Mrs Herrmann's age, no significant coronary artery disease that is capable of cutting off blood flow. Noncardiac portion read by radiologist was also normal without acute finding.

## 2023-02-06 ENCOUNTER — Telehealth: Payer: Self-pay | Admitting: Cardiology

## 2023-02-06 DIAGNOSIS — Z79899 Other long term (current) drug therapy: Secondary | ICD-10-CM

## 2023-02-06 MED ORDER — POTASSIUM CHLORIDE CRYS ER 20 MEQ PO TBCR: 40.0000 meq | EXTENDED_RELEASE_TABLET | Freq: Two times a day (BID) | ORAL | 3 refills | Status: DC

## 2023-02-06 NOTE — Telephone Encounter (Signed)
-----   Message from Charlie Pitter, [place] sent at 02/06/2023 12:23 PM EDT ----- Reviewing results left in inbox by [facility] for final review. Agree labs stable though K still not totally at goal of 4.0 given PVCs. Interestingly this is lower than last check even though we increased her potassium dose. Please confirm she is taking KCl 46meq daily (this should be 2 40meq tablets daily). If not, would recommend taking this 60meq every morning with BMET 1 week. If she is taking 58meq daily as prescribed, please increase to 36meq twice daily with BMET in 1 week. Make sure to take supplement 3 hours before having labs drawn that day, non-fasting. Please increase dietary intake of healthy sources of potassium including bananas, squash, yogurt, white beans, sweet potatoes, leafy greens, and avocados if not allergic to these.

## 2023-02-06 NOTE — Telephone Encounter (Signed)
Patient is returning call. Please advise? 

## 2023-02-06 NOTE — Telephone Encounter (Signed)
Patient notified and verbalized understanding. Patient had no questions or concerns at this time. PCP copied 

## 2023-02-13 ENCOUNTER — Other Ambulatory Visit (HOSPITAL_COMMUNITY)
Admission: RE | Admit: 2023-02-13 | Discharge: 2023-02-13 | Disposition: A | Discharge status: Home / Self Care | Payer: Medicare Other | Source: Ambulatory Visit | Attending: Physician Assistant | Admitting: Physician Assistant

## 2023-02-13 DIAGNOSIS — Z79899 Other long term (current) drug therapy: Secondary | ICD-10-CM | POA: Insufficient documentation

## 2023-02-13 LAB — BASIC METABOLIC PANEL WITH GFR
Anion gap: 7 (ref 5–15)
BUN: 19 mg/dL (ref 8–23)
CO2: 28 mmol/L (ref 22–32)
Calcium: 9.1 mg/dL (ref 8.9–10.3)
Chloride: 102 mmol/L (ref 98–111)
Creatinine, Ser: 1.06 mg/dL — ABNORMAL HIGH (ref 0.44–1.00)
GFR, Estimated: 54 mL/min — ABNORMAL LOW
Glucose, Bld: 94 mg/dL (ref 70–99)
Potassium: 4 mmol/L (ref 3.5–5.1)
Sodium: 137 mmol/L (ref 135–145)

## 2023-02-14 ENCOUNTER — Telehealth: Payer: Self-pay

## 2023-02-14 NOTE — Telephone Encounter (Signed)
-----   Message from Charlie Pitter, [place] sent at 02/13/2023 12:13 PM EDT ----- Please let pt know potassium is now at goal with adjustment in potassium rx to twice a day. Kidney function number just borderline abnormal but stable looking back to 2023 trends. Keep f/u as planned.

## 2023-02-14 NOTE — Telephone Encounter (Signed)
Patient notified and verbalized understanding. Patient had no questions or concerns at this time. PCP copied 

## 2023-02-15 ENCOUNTER — Telehealth: Payer: Self-pay | Admitting: Allergy & Immunology

## 2023-02-15 ENCOUNTER — Encounter: Payer: Self-pay | Admitting: Allergy & Immunology

## 2023-02-15 ENCOUNTER — Other Ambulatory Visit: Payer: Self-pay

## 2023-02-15 ENCOUNTER — Ambulatory Visit (INDEPENDENT_AMBULATORY_CARE_PROVIDER_SITE_OTHER): Payer: Medicare Other | Admitting: Allergy & Immunology

## 2023-02-15 VITALS — BP 118/78 | HR 94 | Temp 98.2°F | Resp 20 | Wt 182.2 lb

## 2023-02-15 DIAGNOSIS — J310 Chronic rhinitis: Secondary | ICD-10-CM | POA: Diagnosis not present

## 2023-02-15 DIAGNOSIS — R053 Chronic cough: Secondary | ICD-10-CM | POA: Diagnosis not present

## 2023-02-15 DIAGNOSIS — K219 Gastro-esophageal reflux disease without esophagitis: Secondary | ICD-10-CM

## 2023-02-15 MED ORDER — PREDNISONE 10 MG PO TABS: ORAL_TABLET | ORAL | 0 refills | Status: DC

## 2023-02-15 MED ORDER — FAMOTIDINE 40 MG PO TABS: 40.0000 mg | ORAL_TABLET | Freq: Two times a day (BID) | ORAL | 5 refills | Status: DC

## 2023-02-15 MED ORDER — DOXYCYCLINE MONOHYDRATE 100 MG PO TABS: 100.0000 mg | ORAL_TABLET | Freq: Two times a day (BID) | ORAL | 0 refills | Status: DC

## 2023-02-15 MED ORDER — DOXYCYCLINE MONOHYDRATE 100 MG PO TABS: 100.0000 mg | ORAL_TABLET | Freq: Two times a day (BID) | ORAL | 0 refills | Status: AC

## 2023-02-15 MED ORDER — IPRATROPIUM BROMIDE 0.03 % NA SOLN: 2.0000 | Freq: Three times a day (TID) | NASAL | 5 refills | Status: DC | PRN

## 2023-02-15 NOTE — Telephone Encounter (Signed)
Medications [facility] [place]. I called and left a message to inform the patient.

## 2023-02-15 NOTE — Telephone Encounter (Signed)
Feather called in and states that Pepcid, Atrovent, and Prednisone were sent to the mail in pharmacy and she needs them sent [facility] [place].

## 2023-02-15 NOTE — Progress Notes (Signed)
FOLLOW UP  Date of Service/Encounter:  02/15/23   Assessment:   Chronic cough - doubt allergic trigger, but treating now for sinusitis    Mixed rhinitis - with sensitization to trees, but clearly this is more than just a reaction to tree pollen given the time of the year that it occurs   Gastroesophageal reflux disease - on famotidine and pantoprazole  Plan/Recommendations:   1. Chronic cough - Lung testing not done today - Continue with your plan per Dr. Melvyn Novas.   2. Chronic rhinitis - Previous testing showed: trees - Continue taking: Chlortab per Dr. Melvyn Novas - Continue taking: Atrovent (ipratropium) 0.03% one spray per nostril 2-3 times daily as needed (CAN BE OVER DRYING) - You can use an extra dose of the antihistamine, if needed, for breakthrough symptoms.  - Consider nasal saline rinses 1-2 times daily to remove allergens from the nasal cavities as well as help with mucous clearance (this is especially helpful to do before the nasal sprays are given) - Start taking: doxycycline 100mg  twice daily for two weeks - Start taking: prednisone tape as prescribed - We may need to get a sinus CT in the future.   3. Gastroesophageal reflux disease - Continue taking omeprazole 40mg  twice daily.  - Add on Pepcid 40mg  twice daily.  - Recent endoscopy was normal.   4. Return in about 4 weeks (around 03/15/2023).    Subjective:   Tammy [name] is a 77 y.o. 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  Tammy [name] is a 77 y.o. female presenting for a follow up visit.  She was last seen in in October 2023 for evaluation of a chronic cough.  She was already followed by Dr. Work.  Her lung testing looked amazing.  Testing was positive to trees.  We continue with chlorpheniramine and started Atrovent.  GERD was controlled with her reflux medication.  Since hte last visit, she has not really changed at all. S  Asthma/Respiratory Symptom History: She did have two weeks where he cough was gone a couple of weeks ago. She was so happy during that time.  She saw Dr. Melvyn Novas on December 29th. She has bene on gabapentin which did not help at all. It does not seem like anything is working at all. If she takes somethinf for a long perid of time, her "system gets used to it".   She has albuterol to use as needed. She was on a nebulizer but it was making her heart flutter. She has Yupelri on her medication list but is requesting that it get removed today. She was using Maretta Bees, but she is not convinced that this was helping and she was having the tachycardia  from it. She has been on Singulair for years, but she felt that she was "immune to it" now. So she stopped it.   She had a chest CT angiogram in July 2023 that showed diffuse bronchial wall thickening consistent with smoking-related small airway disease. She has never been a smoker, but her husband worked at the cigarette factory.   Chest CT (July 2023):  IMPRESSION: 1. No central or proximal segmental pulmonary embolus. Markedly limited evaluation more distally due to timing of contrast. Unable to evaluate previously question left lower lobe pulmonary embolus. 2.  No acute aortic abnormality. 3. Diffuse bronchial wall thickening consistent with smoking-related small airway disease. 4. Aortic Atherosclerosis (ICD10-I70.0) including three-vessel coronary calcification.  She has been doing blood work for her Caridologist because her potassium has been so low.   Allergic Rhinitis Symptom History: She is having some issues with her coughing.  She does report some sinus pain and pressure in her maxillary sinuses with some postnasal drip. She does sometimes get antibiotics for sinus infections, but this is not too frequent. She has been having some bladder issues with all of the coughing. She is now wearing a diaper.   GERD Symptom History: She had an endoscopy that was entirely normal. She is still on her reflux medication.   Otherwise, there have been no changes to her past medical history, surgical history, family history, or social history.    Review of Systems  Constitutional: Negative.  Negative for chills, fever, malaise/fatigue and weight loss.  HENT:  Positive for congestion and sinus pain. Negative for ear discharge and ear pain.   Eyes:  Negative for pain, discharge and redness.  Respiratory:  Positive for cough and sputum production. Negative for shortness of breath and wheezing.   Cardiovascular: Negative.  Negative for chest pain and palpitations.  Gastrointestinal:  Negative for abdominal pain, constipation, diarrhea, heartburn, nausea and vomiting.  Skin: Negative.  Negative for itching and rash.  Neurological:  Negative for dizziness and headaches.  Endo/Heme/Allergies:  Positive for environmental allergies. Does not bruise/bleed easily.       Objective:   Blood pressure 118/78, pulse 94, temperature 98.2 F (36.8 C), resp. rate 20, weight 182 lb 4 oz (82.7 kg), SpO2 96 %. Body mass index is 30.33 kg/m.    Physical Exam Vitals reviewed.  Constitutional:      Appearance: She is well-developed.     Comments: Hoarseness.    HENT:     Head: Normocephalic and atraumatic.     Right Ear: Tympanic membrane, ear canal and external ear normal. No drainage, swelling or tenderness. Tympanic membrane is not injected, scarred, erythematous, retracted or bulging.     Left Ear: Tympanic membrane, ear canal and external ear normal. No drainage, swelling or tenderness. Tympanic membrane is not injected, scarred, erythematous, retracted or bulging.     Nose: Mucosal edema and rhinorrhea present. No nasal deformity or septal deviation.     Right Sinus: No maxillary sinus tenderness or frontal sinus tenderness.     Left Sinus: No maxillary sinus tenderness or frontal sinus tenderness.     Comments: No polyps.     Mouth/Throat:     Mouth: Mucous membranes are not pale and not dry.     Pharynx: Uvula midline.  Eyes:     General:        Right eye: No discharge.        Left eye: No discharge.     Conjunctiva/sclera: Conjunctivae normal.  Right eye: Right conjunctiva is not injected. No chemosis.    Left eye: Left conjunctiva is not injected. No chemosis.    Pupils: Pupils are equal, round, and reactive to light.  Cardiovascular:     Rate and Rhythm: Normal rate and regular rhythm.     Heart sounds: Normal heart sounds.  Pulmonary:     Effort: Pulmonary effort is normal. No tachypnea, accessory muscle usage or respiratory distress.     Breath sounds: Normal breath sounds. No wheezing, rhonchi or rales.     Comments: Moving air well in all lung fields. No increased work of breathing noted.  Chest:     Chest wall: No tenderness.  Abdominal:     Tenderness: There is no abdominal tenderness. There is no guarding or rebound.  Lymphadenopathy:     Head:     Right side of head: No submandibular, tonsillar or occipital adenopathy.     Left side of head: No submandibular, tonsillar or occipital adenopathy.     Cervical: No cervical adenopathy.  Skin:    Coloration: Skin is not pale.     Findings: No abrasion, erythema,  petechiae or rash. Rash is not papular, urticarial or vesicular.  Neurological:     Mental Status: She is alert.  Psychiatric:        Behavior: Behavior is cooperative.      Diagnostic studies: none       Salvatore Marvel, MD  [facility]

## 2023-02-15 NOTE — Patient Instructions (Addendum)
1. Chronic cough - Lung testing not done today - Continue with your plan per Dr. Melvyn Novas.   2. Chronic rhinitis - Previous testing showed: trees - Continue taking: Chlortab per Dr. Melvyn Novas - Continue taking: Atrovent (ipratropium) 0.03% one spray per nostril 2-3 times daily as needed (CAN BE OVER DRYING) - You can use an extra dose of the antihistamine, if needed, for breakthrough symptoms.  - Consider nasal saline rinses 1-2 times daily to remove allergens from the nasal cavities as well as help with mucous clearance (this is especially helpful to do before the nasal sprays are given) - Start taking: doxycycline 100mg  twice daily for two weeks - Start taking: prednisone tape as prescribed - We may need to get a sinus CT in the future.   3. Gastroesophageal reflux disease - Continue taking omeprazole 40mg  twice daily.  - Add on Pepcid 40mg  twice daily.  - Recent endoscopy was normal.   4. Return in about 4 weeks (around 03/15/2023).    Please inform us of any Emergency Department visits, hospitalizations, or changes in symptoms. Call us before going to the ED for breathing or allergy symptoms since we might be able to fit you in for a sick visit. Feel free to contact us anytime with any questions, problems, or concerns.  It was a pleasure to see you today!  Websites that have reliable patient information: 1. American Academy of Asthma, Allergy, and Immunology: www.aaaai.org 2. Food Allergy Research and Education (FARE): foodallergy.org 3. Mothers of Asthmatics: http://www.asthmacommunitynetwork.org 4. American College of Allergy, Asthma, and Immunology: www.acaai.org   COVID-19 Vaccine Information can be found at: ShippingScam.co.uk For questions related to vaccine distribution or appointments, please email vaccine@[email or web address] or call [phone].   We realize that you might be concerned about having an allergic reaction to  the COVID19 vaccines. To help with that concern, WE ARE OFFERING THE COVID19 VACCINES IN OUR OFFICE! Ask the front desk for dates!     "Like" [place] on Facebook and Instagram for our latest updates!      A healthy democracy works best when [employer] participate! Make sure you are registered to vote! If you have moved or changed any of your contact information, you will need to get this updated before voting!  In some cases, you MAY be able to register to vote online: [email or web address]

## 2023-02-17 ENCOUNTER — Encounter: Payer: Self-pay | Admitting: Allergy & Immunology

## 2023-02-17 ENCOUNTER — Telehealth: Payer: Self-pay | Admitting: *Deleted

## 2023-02-17 NOTE — Telephone Encounter (Signed)
Can someone call and see how the patient is doing today?   I called and spoke with the patient and she stated that she is able to feel a little better from Wednesday. I did advise to call if she needs anything, patient verbalized understanding and states that she will do so.

## 2023-02-17 NOTE — Telephone Encounter (Signed)
Thank you for reaching out.  Bernette Seeman, MD [facility]  

## 2023-02-20 NOTE — Addendum Note (Signed)
Addended by: Valentina Shaggy on: 02/20/2023 01:17 PM   Modules accepted: Orders

## 2023-02-20 NOTE — Progress Notes (Signed)
Since the patient has not improved, we are going to work on getting a sinus CT approved.

## 2023-02-21 DIAGNOSIS — R7303 Prediabetes: Secondary | ICD-10-CM | POA: Diagnosis not present

## 2023-02-21 DIAGNOSIS — E782 Mixed hyperlipidemia: Secondary | ICD-10-CM | POA: Diagnosis not present

## 2023-02-22 ENCOUNTER — Ambulatory Visit: Payer: Medicare Other | Attending: Student | Admitting: Student

## 2023-02-22 ENCOUNTER — Encounter: Payer: Self-pay | Admitting: Student

## 2023-02-22 VITALS — BP 118/76 | HR 70 | Ht 65.0 in | Wt 184.2 lb

## 2023-02-22 DIAGNOSIS — I10 Essential (primary) hypertension: Secondary | ICD-10-CM | POA: Diagnosis not present

## 2023-02-22 DIAGNOSIS — I2584 Coronary atherosclerosis due to calcified coronary lesion: Secondary | ICD-10-CM | POA: Insufficient documentation

## 2023-02-22 DIAGNOSIS — E785 Hyperlipidemia, unspecified: Secondary | ICD-10-CM | POA: Insufficient documentation

## 2023-02-22 DIAGNOSIS — I2510 Atherosclerotic heart disease of native coronary artery without angina pectoris: Secondary | ICD-10-CM | POA: Insufficient documentation

## 2023-02-22 DIAGNOSIS — I4719 Other supraventricular tachycardia: Secondary | ICD-10-CM | POA: Insufficient documentation

## 2023-02-22 MED ORDER — ROSUVASTATIN CALCIUM 5 MG PO TABS: 5.0000 mg | ORAL_TABLET | Freq: Every evening | ORAL | 3 refills | Status: DC

## 2023-02-22 NOTE — Progress Notes (Signed)
Cardiology Office Note:    Date:  02/22/2023  ID:  Tammy [name], DOB [birth year], MRN [id number]  History of Present Illness:    Tammy [name] is a 77 y.o. female with past medical history of HTN, coronary calcification by CT, PVCs, PAT, HLD and prior tobacco use who presents to the office today for 1 month follow-up.  She was last examined by Melina Copa, PA in 12/2022 and reported having some dyspnea on exertion along with a chronic cough since her prior hospitalization. Also reported intermittent chest discomfort for the past week which would occur at rest. Options for evaluation were reviewed and a Coronary CT was arranged. This showed a coronary calcium score of 98.2 which was 65th percentile. She did have mild plaque along the RCA and minimal plaque along the LAD and LCx with medical management recommended. The radiology overread did not show any significant abnormalities. Additional labs showed a BNP was normal at 46 and FLP showed her LDL was at goal of 55. She did have hypokalemia but this resolved with dose adjustment of K-Dur and was at 4.0 by most recent labs on 02/13/2023.  In talking with the patient today, she reports still having a nonproductive cough but says this has improved as compared to last year. She was recently started on Prednisone and has noticed a significant improvement in this. She is no longer using her nebulizer machine as she experienced worsening palpitations when utilizing this and did not notice an improvement in symptoms. Reports her chest discomfort was previously occurring while coughing but this has improved. No specific orthopnea, PND or pitting edema.  Studies Reviewed:    EKG: EKG from 01/18/2023 is personally reviewed and shows normal sinus rhythm, heart rate 84 with no acute ST changes.  Echocardiogram: 05/2022 IMPRESSIONS     1. Left ventricular ejection fraction, by estimation, is 70 to 75%. The  left ventricle has hyperdynamic function. The left  ventricle has no  regional wall motion abnormalities. There is moderate hypertrophy of the  basal septal segment. The rest of the LV   segments demonstrate mild left ventricular hypertrophy. Left ventricular  diastolic parameters are consistent with Grade I diastolic dysfunction  (impaired relaxation).   2. Right ventricular systolic function is normal. The right ventricular  size is normal. There is mildly elevated pulmonary artery systolic  pressure. The estimated right ventricular systolic pressure is 0000000 mmHg.   3. The mitral valve is grossly normal. Trivial mitral valve  regurgitation.   4. The aortic valve is tricuspid. There is mild calcification of the  aortic valve. There is mild thickening of the aortic valve. Aortic valve  regurgitation is not visualized. Aortic valve sclerosis/calcification is  present, without any evidence of  aortic stenosis.   5. The inferior vena cava is dilated in size with >50% respiratory  variability, suggesting right atrial pressure of 8 mmHg.   Comparison(s): No prior Echocardiogram.   Coronary CT: 01/2023 MPRESSION: 1. Coronary calcium score of 98.2. This was 65th percentile for age-, race-, and sex-matched controls.   2. Normal coronary origin with right dominance.   3. There is mild (25-49%) soft plaque at the ostium of the RCA. There is minimal (<25%) plaque in the LAD and LCX arteries. CAD-RADS 2.   Interpretation of non-cardiac thoracic structures by Radiology is pending.  IMPRESSION: Aortic Atherosclerosis (ICD10-I70.0).   No other significant extracardiac findings are noted.   Physical Exam:   VS:  BP 118/76   Pulse 70  Ht 5\' 5"  (1.651 m)   Wt 184 lb 3.2 oz (83.6 kg)   SpO2 97%   BMI 30.65 kg/m    Wt Readings from Last 3 Encounters:  02/22/23 184 lb 3.2 oz (83.6 kg)  02/15/23 182 lb 4 oz (82.7 kg)  01/18/23 182 lb 9.6 oz (82.8 kg)     GEN: Pleasant female appearing in no acute distress NECK: No JVD; No carotid  bruits CARDIAC: RRR, no murmurs, rubs, gallops RESPIRATORY:  Clear to auscultation without rales, wheezing or rhonchi  ABDOMEN: Soft, non-tender, non-distended EXTREMITIES:  No pitting edema; No deformity   ASSESSMENT AND PLAN:    1. CAD - Her recent Coronary CT was reviewed in detail with the patient today as this showed mild plaque along the RCA and minimal plaque along the LAD and LCx with medical management recommended. She will continue on ASA 81 mg daily and Crestor 5 mg daily.  2. Palpitations - She has a history of PVC's and PAT by review of notes but no longer requires the use of AV nodal blocking agents. No recent symptoms, therefore no indication to restart at this time.  3. HTN - Her blood pressure is well-controlled at 118/76 during today's visit. Continue current medical therapy with Valsartan-HCTZ 160-25 mg daily.  By review of recent labs, her K+ has now normalized with dose adjustment of K-dur.   4. HLD - Recent labs by her PCP earlier this week showed her LDL was at 59 and LFT's were WNL. Continue Crestor 5mg  daily.   Signed, Erma Heritage, PA-C

## 2023-02-22 NOTE — Patient Instructions (Signed)
Medication Instructions:  Your physician recommends that you continue on your current medications as directed. Please refer to the Current Medication list given to you today.  *If you need a refill on your cardiac medications before your next appointment, please call your pharmacy*   Lab Work: NONE   If you have labs (blood work) drawn today and your tests are completely normal, you will receive your results only by: MyChart Message (if you have MyChart) OR A paper copy in the mail If you have any lab test that is abnormal or we need to change your treatment, we will call you to review the results.   Testing/Procedures: NONE  Follow-Up: At [facility], you and your health needs are our priority.  As part of our continuing mission to provide you with exceptional heart care, we have created designated Provider Care Teams.  These Care Teams include your primary Cardiologist (physician) and Advanced Practice Providers (APPs -  Physician Assistants and Nurse Practitioners) who all work together to provide you with the care you need, when you need it.  We recommend signing up for the patient portal called "MyChart".  Sign up information is provided on this After Visit Summary.  MyChart is used to connect with patients for Virtual Visits (Telemedicine).  Patients are able to view lab/test results, encounter notes, upcoming appointments, etc.  Non-urgent messages can be sent to your provider as well.   To learn more about what you can do with MyChart, go to [email or web address].    Your next appointment:   1 year(s)  Provider:   Vishnu Mallipeddi, MD    Other Instructions Thank you for choosing  HeartCare!    

## 2023-03-01 DIAGNOSIS — R7303 Prediabetes: Secondary | ICD-10-CM | POA: Diagnosis not present

## 2023-03-01 DIAGNOSIS — J302 Other seasonal allergic rhinitis: Secondary | ICD-10-CM | POA: Diagnosis not present

## 2023-03-01 DIAGNOSIS — R062 Wheezing: Secondary | ICD-10-CM | POA: Diagnosis not present

## 2023-03-01 DIAGNOSIS — K219 Gastro-esophageal reflux disease without esophagitis: Secondary | ICD-10-CM | POA: Diagnosis not present

## 2023-03-01 DIAGNOSIS — E782 Mixed hyperlipidemia: Secondary | ICD-10-CM | POA: Diagnosis not present

## 2023-03-01 DIAGNOSIS — N289 Disorder of kidney and ureter, unspecified: Secondary | ICD-10-CM | POA: Diagnosis not present

## 2023-03-01 DIAGNOSIS — J4551 Severe persistent asthma with (acute) exacerbation: Secondary | ICD-10-CM | POA: Diagnosis not present

## 2023-03-01 DIAGNOSIS — R32 Unspecified urinary incontinence: Secondary | ICD-10-CM | POA: Diagnosis not present

## 2023-03-01 DIAGNOSIS — D509 Iron deficiency anemia, unspecified: Secondary | ICD-10-CM | POA: Diagnosis not present

## 2023-03-01 DIAGNOSIS — I10 Essential (primary) hypertension: Secondary | ICD-10-CM | POA: Diagnosis not present

## 2023-03-01 DIAGNOSIS — E669 Obesity, unspecified: Secondary | ICD-10-CM | POA: Diagnosis not present

## 2023-03-01 DIAGNOSIS — R053 Chronic cough: Secondary | ICD-10-CM | POA: Diagnosis not present

## 2023-03-14 ENCOUNTER — Ambulatory Visit (INDEPENDENT_AMBULATORY_CARE_PROVIDER_SITE_OTHER): Payer: Medicare Other | Admitting: Gastroenterology

## 2023-03-15 ENCOUNTER — Encounter: Payer: Self-pay | Admitting: Family Medicine

## 2023-03-15 ENCOUNTER — Other Ambulatory Visit: Payer: Self-pay

## 2023-03-15 ENCOUNTER — Ambulatory Visit (INDEPENDENT_AMBULATORY_CARE_PROVIDER_SITE_OTHER): Payer: Medicare Other | Admitting: Family Medicine

## 2023-03-15 VITALS — BP 126/80 | HR 86 | Temp 98.0°F | Resp 20 | Ht 65.0 in | Wt 185.0 lb

## 2023-03-15 DIAGNOSIS — J310 Chronic rhinitis: Secondary | ICD-10-CM

## 2023-03-15 DIAGNOSIS — J4520 Mild intermittent asthma, uncomplicated: Secondary | ICD-10-CM | POA: Diagnosis not present

## 2023-03-15 DIAGNOSIS — R053 Chronic cough: Secondary | ICD-10-CM | POA: Diagnosis not present

## 2023-03-15 DIAGNOSIS — K219 Gastro-esophageal reflux disease without esophagitis: Secondary | ICD-10-CM | POA: Diagnosis not present

## 2023-03-15 MED ORDER — LEVALBUTEROL TARTRATE 45 MCG/ACT IN AERO: 2.0000 | INHALATION_SPRAY | RESPIRATORY_TRACT | 2 refills | Status: DC | PRN

## 2023-03-15 MED ORDER — LEVALBUTEROL HCL 0.63 MG/3ML IN NEBU: 0.6300 mg | INHALATION_SOLUTION | RESPIRATORY_TRACT | 2 refills | Status: DC | PRN

## 2023-03-15 NOTE — Progress Notes (Signed)
[address] [place] [address] Dept: [phone]  FOLLOW UP NOTE  Patient ID: Tammy [name], female    DOB: 26-Mar-1946  Age: 77 y.o. MRN: [id number] Date of Office Visit: 03/15/2023  Assessment  Chief Complaint: Follow-up (Pt states she has been doing pretty good but the pollen has been bothering her a lot here lately.)  HPI Tammy [name] is a 77 year old female who presents to the clinic for follow-up visit.  She was last seen in this clinic on 02/15/2023 by Dr. Dellis Anes for evaluation of chronic cough, mixed rhinitis, reflux, and acute bacterial sinusitis which required doxycycline. Chart review indicates esophageal manometry is ordered for 06/28/2023.  At today's visit, she reports that her cough has been moderately well controlled and occurs in an intermittent and seemingly random manner. Allergic rhinitis is reported as moderately well controlled with symptoms including clear rhinorrhea, sneezing, and post nasal drainage. She continues cetirizine as needed, ipratropium, and saline nasal rinses daily. She does report complete resolution of symptoms after receiving doxycycline and prednisone about a month ago. She reports that, several years ago, she completed a round of allergen immunotherapy lasting for about 5 years. She is interested in restarting allergen immunotherapy at this time.  Her last environmental allergy skin prick testing was on 09/07/2022 and was positive to tree pollen.  Reflux is reported as well controlled with no symptoms including heartburn or vomiting. She continues omeprazole and famotidine daily.   Her current medications are listed in the chart.   Drug Allergies:  No Known Allergies  Physical Exam: BP 126/80   Pulse 86   Temp 98 F (36.7 C)   Resp 20   Ht  (1.651 m)   Wt 185 lb (83.9 kg)   SpO2 98%   BMI 30.79 kg/m    Physical Exam Vitals reviewed.  Constitutional:      Appearance: Normal appearance.  HENT:     Head:  Normocephalic and atraumatic.     Right Ear: Tympanic membrane normal.     Left Ear: Tympanic membrane normal.     Nose:     Comments: Bilateral nares sightly erythematous with clear nasal drainage noted. Pharynx normal. Ears normal. Eyes normal.    Mouth/Throat:     Pharynx: Oropharynx is clear.  Eyes:     Conjunctiva/sclera: Conjunctivae normal.  Cardiovascular:     Rate and Rhythm: Normal rate and regular rhythm.     Heart sounds: Normal heart sounds. No murmur heard. Pulmonary:     Effort: Pulmonary effort is normal.     Breath sounds: Normal breath sounds.     Comments: Lungs clear to auscultaiton Musculoskeletal:        General: Normal range of motion.     Cervical back: Normal range of motion and neck supple.  Skin:    General: Skin is warm and dry.  Neurological:     Mental Status: She is alert and oriented to person, place, and time.  Psychiatric:        Mood and Affect: Mood normal.        Behavior: Behavior normal.        Thought Content: Thought content normal.        Judgment: Judgment normal.     Diagnostics: FVC 1.95 which is 85% of predicted value, FEV1 1.30 which is 74% of predicted value.  Spirometry indicates normal ventilatory function.  Assessment and Plan: 1. Mild intermittent asthma without complication   2. Chronic cough  3. Mixed rhinitis   4. Gastroesophageal reflux disease, unspecified whether esophagitis present     Meds ordered this encounter  Medications   levalbuterol (XOPENEX) 0.63 MG/3ML nebulizer solution    Sig: Take 3 mLs (0.63 mg total) by nebulization every 4 (four) hours as needed for wheezing or shortness of breath.    Dispense:  90 mL    Refill:  2   levalbuterol (XOPENEX HFA) 45 MCG/ACT inhaler    Sig: Inhale 2 puffs into the lungs every 4 (four) hours as needed for wheezing.    Dispense:  15 g    Refill:  2    Patient Instructions  Asthma Begin levalbuterol 2 puffs every 4 hours as needed for cough or wheeze OR Instead  use levalbuterol 0.083% solution via nebulizer one unit vial every 4 hours as needed for cough or wheeze   Chronic cough Continue to follow-up with Dr. Sherene Sires as recommended Continue tessalon perles up to three times a day as needed for cough  Mixed rhinitis Stop montelukast at this time as it is not providing any perceived benefit Continue allergen avoidance measures directed toward tree pollen as listed below Continue an antihistamine once a day as needed for runny nose or itch.  Patient cautioned regarding sedation with antihistamine Continue ipratropium 2 sprays in each nostril twice a day as needed for runny nose Continue saline rinses daily as needed for nasal symptoms Consider allergen immunotherapy if the treatment plan is not controlling your symptoms  Reflux Continue dietary and lifestyle modifications as listed below Continue famotidine twice a day and omeprazole twice a day as previously prescribed  Call the clinic if this treatment plan is not working well for you.  Follow up in 2 months or sooner if needed.   Return in about 2 months (around 05/15/2023), or if symptoms worsen or fail to improve.    Thank you for the opportunity to care for this patient.  Please do not hesitate to contact me with questions.  Thermon Leyland, FNP [facility]

## 2023-03-15 NOTE — Patient Instructions (Addendum)
Asthma Begin levalbuterol 2 puffs every 4 hours as needed for cough or wheeze OR Instead use levalbuterol 0.083% solution via nebulizer one unit vial every 4 hours as needed for cough or wheeze   Chronic cough Continue to follow-up with Dr. Sherene Sires as recommended Continue tessalon perles up to three times a day as needed for cough  Mixed rhinitis Stop montelukast at this time as it is not providing any perceived benefit Continue allergen avoidance measures directed toward tree pollen as listed below Continue an antihistamine once a day as needed for runny nose or itch.  Patient cautioned regarding sedation with antihistamine Continue ipratropium 2 sprays in each nostril twice a day as needed for runny nose Continue saline rinses daily as needed for nasal symptoms Consider allergen immunotherapy if the treatment plan is not controlling your symptoms  Reflux Continue dietary and lifestyle modifications as listed below Continue famotidine twice a day and omeprazole twice a day as previously prescribed  Call the clinic if this treatment plan is not working well for you.  Follow up in 2 months or sooner if needed.  Reducing Pollen Exposure The American Academy of All[employer] suggests the following steps to reduce your exposure to pollen during allergy seasons. Do not hang sheets or clothing out to dry; pollen may collect on these items. Do not mow lawns or spend time around freshly cut grass; mowing stirs up pollen. Keep windows closed at night.  Keep car windows closed while driving. Minimize morning activities outdoors, a time when pollen counts are usually at their highest. Stay indoors as much as possible when pollen counts or humidity is high and on windy days when pollen tends to remain in the air longer. Use air conditioning when possible.  Many air conditioners have filters that trap the pollen spores. Use a HEPA room air filter to remove pollen form the indoor air  you breathe.

## 2023-03-21 DIAGNOSIS — J301 Allergic rhinitis due to pollen: Secondary | ICD-10-CM | POA: Diagnosis not present

## 2023-03-21 NOTE — Addendum Note (Signed)
Addended by: Alfonse Spruce on: 03/21/2023 08:09 AM   Modules accepted: Orders

## 2023-03-21 NOTE — Progress Notes (Signed)
REPRINT LABELS 

## 2023-03-21 NOTE — Progress Notes (Signed)
Aeroallergen Immunotherapy   Ordering Provider: Dr. Malachi Bonds   Patient Details  Name: Tammy [name]  MRN: [id number]  Date of Birth: [birth year]   Order 1 of 1   Vial Label: Trees   0.5 ml (Volume)  1:20 Concentration -- Eastern 10 Tree Mix (also Sweet Gum)  0.2 ml (Volume)  1:20 Concentration -- Maple Mix*  0.2 ml (Volume)  1:10 Concentration -- Pecan Pollen    0.9  ml Extract Subtotal  4.1  ml Diluent  5.0  ml Maintenance Total   Schedule:  A   Blue Vial (1:100,000): Schedule A (10 doses)  Yellow Vial (1:10,000): Schedule A (10 doses)  Green Vial (1:1,000): Schedule A (10 doses)  Red Vial (1:100): Schedule A (14 doses)   Special Instructions: none

## 2023-03-21 NOTE — Progress Notes (Signed)
VIALS EXP 03-20-24 

## 2023-04-03 ENCOUNTER — Telehealth: Payer: Self-pay

## 2023-04-03 MED ORDER — EPINEPHRINE 0.3 MG/0.3ML IJ SOAJ: 0.3000 mg | Freq: Once | INTRAMUSCULAR | 1 refills | Status: AC

## 2023-04-03 NOTE — Telephone Encounter (Signed)
Patient called in - DOB/Pharmacy verified - wanted to confirm appointment/time on Wednesday, 04/05/23, in [facility] to start her allergy injections.  Patient advised - AIT start scheduled for 09:00 am - Wednesday, 04/05/23 - patient will wait 30 minutes after injection to make sure no immediate allergic reaction. Patient will have Epi pen with as well.  Patient stated she does not have an epi pen - advised I will send in prescription to [facility] for her to pick up and bring with her on Wednesday.  Patient verbalized understanding, no further questions.

## 2023-04-05 ENCOUNTER — Ambulatory Visit (INDEPENDENT_AMBULATORY_CARE_PROVIDER_SITE_OTHER): Payer: Medicare Other

## 2023-04-05 DIAGNOSIS — J309 Allergic rhinitis, unspecified: Secondary | ICD-10-CM

## 2023-04-05 NOTE — Progress Notes (Signed)
Immunotherapy   Patient Details  Name: Tammy [name] MRN: [id number] Date of Birth: 07-29-1946  04/05/2023  Tammy [name] started injections for  trees.  Following schedule: A  Frequency:1 time per week Epi-Pen:Epi-Pen Available  Consent signed previously and patient instructions given. Patient sat in the lobby for thirty minutes without an issue.    Ralene Muskrat 04/05/2023, 9:02 AM

## 2023-04-12 ENCOUNTER — Ambulatory Visit (INDEPENDENT_AMBULATORY_CARE_PROVIDER_SITE_OTHER): Payer: Medicare Other

## 2023-04-12 DIAGNOSIS — J309 Allergic rhinitis, unspecified: Secondary | ICD-10-CM

## 2023-04-19 ENCOUNTER — Ambulatory Visit (INDEPENDENT_AMBULATORY_CARE_PROVIDER_SITE_OTHER): Payer: Medicare Other

## 2023-04-19 DIAGNOSIS — J309 Allergic rhinitis, unspecified: Secondary | ICD-10-CM

## 2023-04-26 ENCOUNTER — Ambulatory Visit (INDEPENDENT_AMBULATORY_CARE_PROVIDER_SITE_OTHER): Payer: Medicare Other

## 2023-04-26 DIAGNOSIS — J309 Allergic rhinitis, unspecified: Secondary | ICD-10-CM

## 2023-05-03 ENCOUNTER — Ambulatory Visit (INDEPENDENT_AMBULATORY_CARE_PROVIDER_SITE_OTHER): Payer: Medicare Other

## 2023-05-03 DIAGNOSIS — J309 Allergic rhinitis, unspecified: Secondary | ICD-10-CM | POA: Diagnosis not present

## 2023-05-10 ENCOUNTER — Ambulatory Visit (INDEPENDENT_AMBULATORY_CARE_PROVIDER_SITE_OTHER): Payer: Medicare Other

## 2023-05-10 DIAGNOSIS — J309 Allergic rhinitis, unspecified: Secondary | ICD-10-CM

## 2023-05-16 ENCOUNTER — Ambulatory Visit
Admission: EM | Admit: 2023-05-16 | Discharge: 2023-05-16 | Disposition: A | Discharge status: Home / Self Care | Payer: Medicare Other | Attending: Nurse Practitioner | Admitting: Nurse Practitioner

## 2023-05-16 ENCOUNTER — Ambulatory Visit (INDEPENDENT_AMBULATORY_CARE_PROVIDER_SITE_OTHER): Payer: Medicare Other

## 2023-05-16 DIAGNOSIS — M79645 Pain in left finger(s): Secondary | ICD-10-CM

## 2023-05-16 DIAGNOSIS — M1812 Unilateral primary osteoarthritis of first carpometacarpal joint, left hand: Secondary | ICD-10-CM | POA: Diagnosis not present

## 2023-05-16 MED ORDER — MELOXICAM 7.5 MG PO TABS: 7.5000 mg | ORAL_TABLET | Freq: Every day | ORAL | 0 refills | Status: AC

## 2023-05-16 NOTE — ED Provider Notes (Signed)
[facility]    CSN: [id number] Arrival date & time: 05/16/23  1024      History   Chief Complaint No chief complaint on file.   HPI Tammy [name] is a 77 y.o. female.   Patient presents today for left thumb pain for the past few months.  No injury, fall, accident, or trauma to the thumb or hand.  Pain is in the base of the thumb and worse with movement and worst first thing in the morning.  Reports when she wakes up in the morning, her thumb is sometimes locked and bent and she has to run warm water over it to get it to loosen.  She feels the bottom of her thumb is a little bit swollen, however no redness, numbness or tingling, or weakness.  No bruising, fever, nausea/vomiting since pain began.  Has tried running hand under warm water, topical gels and creams, and Tylenol for pain which all seems to help temporarily.     Past Medical History:  Diagnosis Date   Asthma    Coronary artery calcification seen on CT scan    Hyperlipidemia    Hypertension    Mild pulmonary hypertension (HCC)    PAT (paroxysmal atrial tachycardia)    PVC's (premature ventricular contractions)    Sinus pause    Type 2 MI (myocardial infarction) [facility])     Patient Active Problem List   Diagnosis Date Noted   Mixed rhinitis 03/15/2023   Mild intermittent asthma without complication 03/15/2023   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   Chronic cough 12/08/2022   Gastroesophageal reflux disease 12/08/2022   Coronary artery calcification 07/25/2022   Upper airway cough syndrome 07/12/2022   Acute respiratory failure with hypoxia (HCC) 06/01/2022   Essential hypertension 06/01/2022   Pulmonary Embolus RULED OUT 06/01/2022   Leukocytosis 06/01/2022   Hyperlipidemia 06/01/2022   Severe asthma with exacerbation 06/01/2022   Elevated troponin 06/01/2022   S/P right rotator cuff repair 10/06/2016   Complete tear of left rotator cuff     Past Surgical History:  Procedure Laterality Date    ABDOMINAL HYSTERECTOMY  1988   CATARACT EXTRACTION W/PHACO Right 09/27/2019   Procedure: CATARACT EXTRACTION PHACO AND INTRAOCULAR LENS PLACEMENT (IOC) (CDE: 4.94);  Surgeon: Fabio Pierce, MD;  Location: [facility];  Service: Ophthalmology;  Laterality: Right;   CATARACT EXTRACTION W/PHACO Left 10/18/2019   Procedure: CATARACT EXTRACTION PHACO AND INTRAOCULAR LENS PLACEMENT (IOC);  Surgeon: Fabio Pierce, MD;  Location: [facility];  Service: Ophthalmology;  Laterality: Left;  CDE: 7.18   COLONOSCOPY N/A 08/11/2016   Procedure: COLONOSCOPY;  Surgeon: Malissa Hippo, MD;  Location: [facility];  Service: Endoscopy;  Laterality: N/A;  730   ELBOW FRACTURE SURGERY     ESOPHAGOGASTRODUODENOSCOPY (EGD) WITH PROPOFOL N/A 12/30/2022   Procedure: ESOPHAGOGASTRODUODENOSCOPY (EGD) WITH PROPOFOL;  Surgeon: Dolores Frame, MD;  Location: [facility];  Service: Gastroenterology;  Laterality: N/A;  10:45 am   KNEE ARTHROSCOPY     POLYPECTOMY  12/30/2022   Procedure: POLYPECTOMY;  Surgeon: Dolores Frame, MD;  Location: [facility];  Service: Gastroenterology;;   SHOULDER OPEN ROTATOR CUFF REPAIR Right 10/06/2016   Procedure: OPEN ROTATOR CUFF REPAIR RIGHT SHOULDER;  Surgeon: Vickki Hearing, MD;  Location: [facility];  Service: Orthopedics;  Laterality: Right;    OB History   No obstetric history on file.      Home Medications    Prior to Admission medications   Medication Sig Start Date  End Date Taking? Authorizing Provider  meloxicam (MOBIC) 7.5 MG tablet Take 1 tablet (7.5 mg total) by mouth daily for 14 days. 05/16/23 05/30/23 Yes Valentino Nose, NP  aspirin EC 81 MG tablet Take 1 tablet (81 mg total) by mouth daily. Swallow whole. 01/18/23   Dunn, Tacey Ruiz, PA-C  chlorpheniramine (CHLOR-TRIMETON) 4 MG tablet Take 4 mg by mouth every 4 (four) hours as needed for allergies.    [provider]  famotidine (PEPCID) 40 MG tablet Take 1 tablet (40 mg total) by mouth 2 (two)  times daily. 02/15/23   Alfonse Spruce, MD  ipratropium (ATROVENT) 0.03 % nasal spray Place 2 sprays into both nostrils 3 (three) times daily as needed for rhinitis. 02/15/23   Alfonse Spruce, MD  levalbuterol [facility] HFA) 45 MCG/ACT inhaler Inhale 2 puffs into the lungs every 4 (four) hours as needed for wheezing. 03/15/23   Ambs, Norvel Richards, FNP  levalbuterol (XOPENEX) 0.63 MG/3ML nebulizer solution Take 3 mLs (0.63 mg total) by nebulization every 4 (four) hours as needed for wheezing or shortness of breath. 03/15/23   Hetty Blend, FNP  omeprazole (PRILOSEC) 40 MG capsule TAKE 1 CAPSULE BY MOUTH TWICE A DAY 01/30/23   Carlan, Chelsea L, NP  potassium chloride SA (KLOR-CON M) 20 MEQ tablet Take 2 tablets (40 mEq total) by mouth 2 (two) times daily. Patient taking differently: Take 40 mEq by mouth daily. 02/06/23   Dunn, Tacey Ruiz, PA-C  rosuvastatin (CRESTOR) 5 MG tablet Take 1 tablet (5 mg total) by mouth every evening. 02/22/23   Strader, Lennart Pall, PA-C  traZODone (DESYREL) 50 MG tablet Take by mouth. 03/01/23   [provider]  valsartan-hydrochlorothiazide (DIOVAN-HCT) 160-25 MG tablet TAKE 1 TABLET BY MOUTH EVERY DAY 10/03/22   Nyoka Cowden, MD    Family History Family History  Problem Relation Age of Onset   Eczema Father    Asthma Father    Cancer Maternal Grandmother     Social History Social History   Tobacco Use   Smoking status: Former    Packs/day: 0.75    Years: 15.00    Additional pack years: 0.00    Total pack years: 11.25    Types: Cigarettes    Quit date: 11/28/1983    Years since quitting: 39.4   Smokeless tobacco: Never  Vaping Use   Vaping Use: Never used  Substance Use Topics   Alcohol use: No   Drug use: No     Allergies   Patient has no known allergies.   Review of Systems Review of Systems Per HPI  Physical Exam Triage Vital Signs ED Triage Vitals  Enc Vitals Group     BP 05/16/23 1031 127/82     Pulse Rate 05/16/23 1031 83      Resp 05/16/23 1031 15     Temp 05/16/23 1031 98.2 F (36.8 C)     Temp Source 05/16/23 1031 Oral     SpO2 05/16/23 1031 97 %     Weight --      Height --      Head Circumference --      Peak Flow --      Pain Score 05/16/23 1032 8     Pain Loc --      Pain Edu? --      Excl. in GC? --    No data found.  Updated Vital Signs BP 127/82 (BP Location: Right Arm)   Pulse 83  Temp 98.2 F (36.8 C) (Oral)   Resp 15   SpO2 97%   Visual Acuity Right Eye Distance:   Left Eye Distance:   Bilateral Distance:    Right Eye Near:   Left Eye Near:    Bilateral Near:     Physical Exam Vitals and nursing note reviewed.  Constitutional:      General: She is not in acute distress.    Appearance: Normal appearance. She is not toxic-appearing.  HENT:     Mouth/Throat:     Mouth: Mucous membranes are moist.     Pharynx: Oropharynx is clear.  Pulmonary:     Effort: Pulmonary effort is normal. No respiratory distress.  Musculoskeletal:     Comments: Inspection: mild swelling noted to the left CMC joint; no bruising, obvious deformity, or redness Palpation: left CMC joint exquisitely tender to palpation; no obvious deformities palpated ROM: difficult to assess secondary to pain Neurovascular: left thumb distally neurovascularly intact  Skin:    General: Skin is warm and dry.     Capillary Refill: Capillary refill takes less than 2 seconds.     Coloration: Skin is not jaundiced or pale.     Findings: No erythema.  Neurological:     Mental Status: She is alert and oriented to person, place, and time.  Psychiatric:        Behavior: Behavior is cooperative.      UC Treatments / Results  Labs (all labs ordered are listed, but only abnormal results are displayed) Labs Reviewed - No data to display  EKG   Radiology DG Finger Thumb Left  Result Date: 05/16/2023 CLINICAL DATA:  Thumb pain for several months. EXAM: LEFT THUMB 2+V COMPARISON:  None Available. FINDINGS: No  fracture. No subluxation or dislocation. Advanced degenerative changes are seen at the first carpometacarpal joint with associated degenerative change in the IP joint. IMPRESSION: Advanced degenerative changes at the first carpometacarpal joint with associated degenerative change in the IP joint. Electronically Signed   By: Kennith Center M.D.   On: 05/16/2023 11:53    Procedures Procedures (including critical care time)  Medications Ordered in UC Medications - No data to display  Initial Impression / Assessment and Plan / UC Course  I have reviewed the triage vital signs and the nursing notes.  Pertinent labs & imaging results that were available during my care of the patient were reviewed by me and considered in my medical decision making (see chart for details).   Patient is well-appearing, normotensive, afebrile, not tachycardic, not tachypneic, oxygenating well on room air.    1. Pain of left thumb X-ray today shows advanced degenerative changes associated in the areas of pain Arthritis is likely cause of pain Will treat with meloxicam 7.5 mg daily -cardiovascular precautions discussed with patient Continue Tylenol and topical Voltaren Thumb spica splint provided today Recommended follow-up with orthopedic provider for persistent/worsening symptoms despite treatment  The patient was given the opportunity to ask questions.  All questions answered to their satisfaction.  The patient is in agreement to this plan.  Final Clinical Impressions(s) / UC Diagnoses   Final diagnoses:  Pain of left thumb     Discharge Instructions      I will call you later today if the xray is abnormal.  If the xray is normal, you will not hear from [place] later.    Please wear the thumb splint until you are able to follow up with either an Hand Specialist or PCP.  Contact information for Hand Specialist has been provided.   You can continue the OTC topical agents you are using for the thumb pain.  You  can also take the NSAID (Mobic) to help with inflammation in your thumb.  Continue Tylenol [phone] mg every 6 hours as needed for pain.       ED Prescriptions     Medication Sig Dispense Auth. Provider   meloxicam (MOBIC) 7.5 MG tablet Take 1 tablet (7.5 mg total) by mouth daily for 14 days. 14 tablet Valentino Nose, NP      PDMP not reviewed this encounter.   Valentino Nose, NP 05/16/23 1415

## 2023-05-16 NOTE — ED Notes (Signed)
Pt has received thumb spica and instructed how to use it. Pt is aware and verbalized understanding

## 2023-05-16 NOTE — ED Triage Notes (Signed)
Pt c/o thumb pain on the left hand pt states it has been going on since February and has gotten worse over the past month. Pt has tried to use OTC remedies to help alleviate pain but has found no relief.

## 2023-05-16 NOTE — Discharge Instructions (Addendum)
I will call you later today if the xray is abnormal.  If the xray is normal, you will not hear from [place] later.    Please wear the thumb splint until you are able to follow up with either an Hand Specialist or PCP.  Contact information for Hand Specialist has been provided.   You can continue the OTC topical agents you are using for the thumb pain.  You can also take the NSAID (Mobic) to help with inflammation in your thumb.  Continue Tylenol [phone] mg every 6 hours as needed for pain.

## 2023-05-19 ENCOUNTER — Ambulatory Visit: Payer: Self-pay

## 2023-05-19 ENCOUNTER — Ambulatory Visit (INDEPENDENT_AMBULATORY_CARE_PROVIDER_SITE_OTHER): Payer: Medicare Other | Admitting: Family Medicine

## 2023-05-19 ENCOUNTER — Other Ambulatory Visit: Payer: Self-pay

## 2023-05-19 ENCOUNTER — Encounter: Payer: Self-pay | Admitting: Family Medicine

## 2023-05-19 ENCOUNTER — Other Ambulatory Visit: Payer: Self-pay | Admitting: Family Medicine

## 2023-05-19 VITALS — BP 128/84 | HR 80 | Temp 98.4°F | Resp 16 | Wt 186.0 lb

## 2023-05-19 DIAGNOSIS — J302 Other seasonal allergic rhinitis: Secondary | ICD-10-CM | POA: Diagnosis not present

## 2023-05-19 DIAGNOSIS — R053 Chronic cough: Secondary | ICD-10-CM | POA: Diagnosis not present

## 2023-05-19 DIAGNOSIS — J301 Allergic rhinitis due to pollen: Secondary | ICD-10-CM | POA: Insufficient documentation

## 2023-05-19 DIAGNOSIS — J4520 Mild intermittent asthma, uncomplicated: Secondary | ICD-10-CM

## 2023-05-19 DIAGNOSIS — K219 Gastro-esophageal reflux disease without esophagitis: Secondary | ICD-10-CM | POA: Diagnosis not present

## 2023-05-19 DIAGNOSIS — J309 Allergic rhinitis, unspecified: Secondary | ICD-10-CM

## 2023-05-19 MED ORDER — FLUTICASONE PROPIONATE HFA 44 MCG/ACT IN AERO: 2.0000 | INHALATION_SPRAY | Freq: Two times a day (BID) | RESPIRATORY_TRACT | 5 refills | Status: DC

## 2023-05-19 NOTE — Progress Notes (Signed)
[address] [place] [address] Dept: [phone]  FOLLOW UP NOTE  Patient ID: Tammy [name], female    DOB: January 17, 1946  Age: 77 y.o. MRN: [id number] Date of Office Visit: 05/19/2023  Assessment  Chief Complaint: Follow-up (Scratchy throat at the moment, feels like she has to clear her throat)  HPI Tammy [name] is a 77 year old female who presents to the clinic for follow-up visit.  She was last seen in this clinic on 03/15/2023 by Thermon Leyland, FNP, for evaluation of asthma, allergic rhinitis, chronic cough, and reflux.    At today's visit, she reports her asthma has been moderately well-controlled with occasional shortness of breath with activity and occasional cough producing thick mucus that began about 1 week ago.  She reports heat increases her symptoms of asthma.  She continues lev albuterol about 3 times a day with relief of symptoms.  She reports that prior to last week her asthma had been well-controlled.  She does believe most of her breathing issues are due to thick postnasal drainage.  She continues to follow up with Dr. Sherene Sires for chronic cough.  Allergic rhinitis is reported as moderately well-controlled with nasal congestion, sneezing, and postnasal drainage as the main symptoms that began about 1 week ago.  She denies fever, sweats, chills, or sick contacts.  She continues either cetirizine or Claritin once a day and began using Flonase nasal spray last week.  She reports that she has used saline nasal rinses 1 time this week.  She continues to wear a hat and wraparound glasses while she is outside.  She does occasionally wear a mask when she is outside. Her last environmental allergy skin testing was on 09/07/2022 was positive to tree pollens.  She began allergen immunotherapy on 04/05/2023.  She denies large or local reactions with allergen immunotherapy.  EpiPen set is up-to-date.  Reflux is reported as well-controlled with no heartburn or vomiting noted.  She  continues famotidine twice a day and omeprazole once a day.  She reports that she does take her omeprazole 30 minutes before her first meal.  Her current medications are listed in the chart.  Drug Allergies:  No Known Allergies  Physical Exam: BP 128/84   Pulse 80   Temp 98.4 F (36.9 C)   Resp 16   Wt 186 lb (84.4 kg)   SpO2 96%   BMI 30.95 kg/m    Physical Exam Vitals reviewed.  Constitutional:      Appearance: Normal appearance.  HENT:     Head: Normocephalic and atraumatic.     Right Ear: Tympanic membrane normal.     Left Ear: Tympanic membrane normal.     Nose:     Comments: Bilateral nares slightly erythematous with clear nasal drainage noted.  Pharynx normal.  Ears normal.  Eyes normal.    Mouth/Throat:     Pharynx: Oropharynx is clear.  Eyes:     Conjunctiva/sclera: Conjunctivae normal.  Cardiovascular:     Rate and Rhythm: Normal rate and regular rhythm.     Heart sounds: Normal heart sounds. No murmur heard. Pulmonary:     Effort: Pulmonary effort is normal.     Breath sounds: Normal breath sounds.     Comments: Lungs clear to auscultation Musculoskeletal:        General: Normal range of motion.     Cervical back: Normal range of motion and neck supple.  Skin:    General: Skin is warm and dry.  Neurological:  Mental Status: She is alert and oriented to person, place, and time.  Psychiatric:        Mood and Affect: Mood normal.        Behavior: Behavior normal.        Thought Content: Thought content normal.        Judgment: Judgment normal.     Diagnostics: FVC 2.63 which is 115% of predicted value, FEV1 1.71 which is 98% of predicted value.  Spirometry indicates normal ventilatory function.  Assessment and Plan: 1. Mild intermittent asthma without complication   2. Chronic cough   3. Other seasonal allergic rhinitis   4. Gastroesophageal reflux disease, unspecified whether esophagitis present     Patient Instructions  Asthma Begin  Flovent 44-2 puffs twice a day to prevent cough or wheeze Continue levalbuterol 2 puffs every 4 hours as needed for cough or wheeze OR Instead use levalbuterol 0.083% solution via nebulizer one unit vial every 4 hours as needed for cough or wheeze   Chronic cough Continue to follow-up with Dr. Sherene Sires as recommended Continue tessalon perles up to three times a day as needed for cough  Mixed rhinitis Begin Mucinex [phone] mg twice a day and increase fluid to thin mucus Continue allergen avoidance measures directed toward tree pollen as listed below Continue an antihistamine once a day as needed for runny nose or itch.  Patient cautioned regarding sedation with antihistamine Continue Flonase 2 sprays in each nostril once a day as needed for a stuffy nose.  In the right nostril, point the applicator out toward the right ear. In the left nostril, point the applicator out toward the left ear Increase saline rinses to daily as needed for nasal symptoms Continue ipratropium 2 sprays in each nostril twice a day as needed for runny nose Consider allergen immunotherapy if the treatment plan is not controlling your symptoms  Reflux Continue dietary and lifestyle modifications as listed below Continue famotidine twice a day and omeprazole twice a day as previously prescribed  Call the clinic if this treatment plan is not working well for you.  Follow up in 3 months or sooner if needed.  Return in about 3 months (around 08/19/2023), or if symptoms worsen or fail to improve.    Thank you for the opportunity to care for this patient.  Please do not hesitate to contact me with questions.  Thermon Leyland, FNP [facility]

## 2023-05-19 NOTE — Patient Instructions (Addendum)
Asthma Begin Flovent 44-2 puffs twice a day to prevent cough or wheeze Continue levalbuterol 2 puffs every 4 hours as needed for cough or wheeze OR Instead use levalbuterol 0.083% solution via nebulizer one unit vial every 4 hours as needed for cough or wheeze   Chronic cough Continue to follow-up with Dr. Sherene Sires as recommended Continue tessalon perles up to three times a day as needed for cough  Mixed rhinitis Begin Mucinex [phone] mg twice a day and increase fluid to thin mucus Continue allergen avoidance measures directed toward tree pollen as listed below Continue an antihistamine once a day as needed for runny nose or itch.  Patient cautioned regarding sedation with antihistamine Continue Flonase 2 sprays in each nostril once a day as needed for a stuffy nose.  In the right nostril, point the applicator out toward the right ear. In the left nostril, point the applicator out toward the left ear Increase saline rinses to daily as needed for nasal symptoms Continue ipratropium 2 sprays in each nostril twice a day as needed for runny nose Consider allergen immunotherapy if the treatment plan is not controlling your symptoms  Reflux Continue dietary and lifestyle modifications as listed below Continue famotidine twice a day and omeprazole twice a day as previously prescribed  Call the clinic if this treatment plan is not working well for you.  Follow up in 3 months or sooner if needed.  Reducing Pollen Exposure The American Academy of Allergy[employer] Ast[employer] suggests the following steps to reduce your exposure to pollen during allergy seasons. Do not hang sheets or clothing out to dry; pollen may collect on these items. Do not mow lawns or spend time around freshly cut grass; mowing stirs up pollen. Keep windows closed at night.  Keep car windows closed while driving. Minimize morning activities outdoors, a time when pollen counts are usually at their highest. Stay indoors as  much as possible when pollen counts or humidity is high and on windy days when pollen tends to remain in the air longer. Use air conditioning when possible.  Many air conditioners have filters that trap the pollen spores. Use a HEPA room air filter to remove pollen form the indoor air you breathe.

## 2023-05-20 ENCOUNTER — Other Ambulatory Visit: Payer: Self-pay | Admitting: Internal Medicine

## 2023-05-22 NOTE — Telephone Encounter (Signed)
PA please. She can not get the medication out of a breath activated device. Thank you

## 2023-05-22 NOTE — Telephone Encounter (Signed)
Pts insurance does not cover the flovent bran dor generic advise to arnuity change

## 2023-05-22 NOTE — Addendum Note (Signed)
Addended by: Elsworth Soho on: 05/22/2023 05:08 PM   Modules accepted: Orders

## 2023-05-24 ENCOUNTER — Other Ambulatory Visit (HOSPITAL_COMMUNITY): Payer: Self-pay

## 2023-05-24 ENCOUNTER — Telehealth: Payer: Self-pay

## 2023-05-24 ENCOUNTER — Ambulatory Visit (INDEPENDENT_AMBULATORY_CARE_PROVIDER_SITE_OTHER): Payer: Medicare Other

## 2023-05-24 DIAGNOSIS — J309 Allergic rhinitis, unspecified: Secondary | ICD-10-CM | POA: Diagnosis not present

## 2023-05-24 NOTE — Telephone Encounter (Signed)
*  Asthma/Allergy  PA request received for Fluticasone Propionate HFA 44MCG/ACT aerosol  *She can not get the medication out of a breath activated device.   PA submitted to [facility] via CMM and has been APPROVED from 05/24/2023-11/28/2023  Key: [id number]

## 2023-05-24 NOTE — Telephone Encounter (Signed)
PA approved.

## 2023-05-31 ENCOUNTER — Ambulatory Visit (INDEPENDENT_AMBULATORY_CARE_PROVIDER_SITE_OTHER): Payer: Medicare Other

## 2023-05-31 DIAGNOSIS — J309 Allergic rhinitis, unspecified: Secondary | ICD-10-CM | POA: Diagnosis not present

## 2023-06-07 ENCOUNTER — Ambulatory Visit (INDEPENDENT_AMBULATORY_CARE_PROVIDER_SITE_OTHER): Payer: Self-pay

## 2023-06-07 DIAGNOSIS — J309 Allergic rhinitis, unspecified: Secondary | ICD-10-CM

## 2023-06-13 DIAGNOSIS — J329 Chronic sinusitis, unspecified: Secondary | ICD-10-CM | POA: Diagnosis not present

## 2023-06-13 DIAGNOSIS — R09A2 Foreign body sensation, throat: Secondary | ICD-10-CM | POA: Diagnosis not present

## 2023-06-13 DIAGNOSIS — R053 Chronic cough: Secondary | ICD-10-CM | POA: Diagnosis not present

## 2023-06-13 DIAGNOSIS — R490 Dysphonia: Secondary | ICD-10-CM | POA: Diagnosis not present

## 2023-06-14 ENCOUNTER — Ambulatory Visit (INDEPENDENT_AMBULATORY_CARE_PROVIDER_SITE_OTHER): Payer: Medicare Other

## 2023-06-14 DIAGNOSIS — J309 Allergic rhinitis, unspecified: Secondary | ICD-10-CM

## 2023-06-20 ENCOUNTER — Other Ambulatory Visit: Payer: Self-pay

## 2023-06-20 ENCOUNTER — Telehealth: Payer: Self-pay | Admitting: Gastroenterology

## 2023-06-20 NOTE — Telephone Encounter (Signed)
PT is scheduled for EM on 7/31 at [facility] and she wants to reschedule  it. Please advise of future appointments

## 2023-06-20 NOTE — Telephone Encounter (Signed)
Called the patient. No answer. Left her a message on the voicemail. Appointment moved to the next opening on 10/04/23 at 8:30 am. New instructions mailed to the patient. Left her a return phone number and my name if she has further questions or concerns.

## 2023-06-21 ENCOUNTER — Ambulatory Visit (INDEPENDENT_AMBULATORY_CARE_PROVIDER_SITE_OTHER): Payer: Medicare Other

## 2023-06-21 DIAGNOSIS — J309 Allergic rhinitis, unspecified: Secondary | ICD-10-CM

## 2023-06-28 ENCOUNTER — Ambulatory Visit (INDEPENDENT_AMBULATORY_CARE_PROVIDER_SITE_OTHER): Payer: Medicare Other

## 2023-06-28 DIAGNOSIS — J309 Allergic rhinitis, unspecified: Secondary | ICD-10-CM

## 2023-06-29 DIAGNOSIS — M25551 Pain in right hip: Secondary | ICD-10-CM | POA: Diagnosis not present

## 2023-07-05 ENCOUNTER — Ambulatory Visit (INDEPENDENT_AMBULATORY_CARE_PROVIDER_SITE_OTHER): Payer: Medicare Other

## 2023-07-05 DIAGNOSIS — J309 Allergic rhinitis, unspecified: Secondary | ICD-10-CM

## 2023-07-10 ENCOUNTER — Ambulatory Visit
Admission: EM | Admit: 2023-07-10 | Discharge: 2023-07-10 | Disposition: A | Discharge status: Home / Self Care | Payer: Medicare Other | Attending: Nurse Practitioner | Admitting: Nurse Practitioner

## 2023-07-10 ENCOUNTER — Ambulatory Visit (INDEPENDENT_AMBULATORY_CARE_PROVIDER_SITE_OTHER): Payer: Medicare Other

## 2023-07-10 DIAGNOSIS — Z8739 Personal history of other diseases of the musculoskeletal system and connective tissue: Secondary | ICD-10-CM

## 2023-07-10 DIAGNOSIS — M461 Sacroiliitis, not elsewhere classified: Secondary | ICD-10-CM

## 2023-07-10 DIAGNOSIS — M47816 Spondylosis without myelopathy or radiculopathy, lumbar region: Secondary | ICD-10-CM | POA: Diagnosis not present

## 2023-07-10 DIAGNOSIS — M5440 Lumbago with sciatica, unspecified side: Secondary | ICD-10-CM

## 2023-07-10 DIAGNOSIS — M4319 Spondylolisthesis, multiple sites in spine: Secondary | ICD-10-CM | POA: Diagnosis not present

## 2023-07-10 DIAGNOSIS — M5126 Other intervertebral disc displacement, lumbar region: Secondary | ICD-10-CM | POA: Diagnosis not present

## 2023-07-10 MED ORDER — PREDNISONE 20 MG PO TABS: 40.0000 mg | ORAL_TABLET | Freq: Every day | ORAL | 0 refills | Status: AC

## 2023-07-10 MED ORDER — DEXAMETHASONE SODIUM PHOSPHATE 10 MG/ML IJ SOLN
10.0000 mg | INTRAMUSCULAR | Status: AC
  Administered 2023-07-10: 10 mg via INTRAMUSCULAR

## 2023-07-10 NOTE — ED Provider Notes (Signed)
[facility]    CSN: [id number] Arrival date & time: 07/10/23  4098      History   Chief Complaint No chief complaint on file.   HPI Tammy [name] is a 77 y.o. female.   The history is provided by the patient.   Patient presents for complaints of worsening chronic right-sided low back pain.  Patient states over the past 3 weeks, symptoms have worsened.  She states the pain starts in the right lower back and radiates down into the left buttock/hip.  Patient denies injury or trauma, but she does admit to some numbness in the right foot.  She further denies bruising, swelling, redness, or inability to bear weight.  Patient reports that she has normally been able to take medication over-the-counter for her symptoms, but states nothing is working at this time.  Past Medical History:  Diagnosis Date   Asthma    Coronary artery calcification seen on CT scan    Hyperlipidemia    Hypertension    Mild pulmonary hypertension (HCC)    PAT (paroxysmal atrial tachycardia)    PVC's (premature ventricular contractions)    Sinus pause    Type 2 MI (myocardial infarction) [facility])     Patient Active Problem List   Diagnosis Date Noted   Other seasonal allergic rhinitis 05/19/2023   Mixed rhinitis 03/15/2023   Mild intermittent asthma without complication 03/15/2023   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   Chronic cough 12/08/2022   Gastroesophageal reflux disease 12/08/2022   Coronary artery calcification 07/25/2022   Upper airway cough syndrome 07/12/2022   Acute respiratory failure with hypoxia (HCC) 06/01/2022   Essential hypertension 06/01/2022   Pulmonary Embolus RULED OUT 06/01/2022   Leukocytosis 06/01/2022   Hyperlipidemia 06/01/2022   Severe asthma with exacerbation 06/01/2022   Elevated troponin 06/01/2022   S/P right rotator cuff repair 10/06/2016   Complete tear of left rotator cuff     Past Surgical History:  Procedure Laterality Date   ABDOMINAL HYSTERECTOMY  1988    CATARACT EXTRACTION W/PHACO Right 09/27/2019   Procedure: CATARACT EXTRACTION PHACO AND INTRAOCULAR LENS PLACEMENT (IOC) (CDE: 4.94);  Surgeon: Fabio Pierce, MD;  Location: [facility];  Service: Ophthalmology;  Laterality: Right;   CATARACT EXTRACTION W/PHACO Left 10/18/2019   Procedure: CATARACT EXTRACTION PHACO AND INTRAOCULAR LENS PLACEMENT (IOC);  Surgeon: Fabio Pierce, MD;  Location: [facility];  Service: Ophthalmology;  Laterality: Left;  CDE: 7.18   COLONOSCOPY N/A 08/11/2016   Procedure: COLONOSCOPY;  Surgeon: Malissa Hippo, MD;  Location: [facility];  Service: Endoscopy;  Laterality: N/A;  730   ELBOW FRACTURE SURGERY     ESOPHAGOGASTRODUODENOSCOPY (EGD) WITH PROPOFOL N/A 12/30/2022   Procedure: ESOPHAGOGASTRODUODENOSCOPY (EGD) WITH PROPOFOL;  Surgeon: Dolores Frame, MD;  Location: [facility];  Service: Gastroenterology;  Laterality: N/A;  10:45 am   KNEE ARTHROSCOPY     POLYPECTOMY  12/30/2022   Procedure: POLYPECTOMY;  Surgeon: Dolores Frame, MD;  Location: [facility];  Service: Gastroenterology;;   SHOULDER OPEN ROTATOR CUFF REPAIR Right 10/06/2016   Procedure: OPEN ROTATOR CUFF REPAIR RIGHT SHOULDER;  Surgeon: Vickki Hearing, MD;  Location: [facility];  Service: Orthopedics;  Laterality: Right;    OB History   No obstetric history on file.      Home Medications    Prior to Admission medications   Medication Sig Start Date End Date Taking? Authorizing Provider  predniSONE (DELTASONE) 20 MG tablet Take 2 tablets (40 mg total) by mouth  daily with breakfast for 5 days. 07/10/23 07/15/23 Yes -Warren, Sadie Haber, NP  aspirin EC 81 MG tablet Take 1 tablet (81 mg total) by mouth daily. Swallow whole. 01/18/23   Dunn, Tacey Ruiz, PA-C  chlorpheniramine (CHLOR-TRIMETON) 4 MG tablet Take 4 mg by mouth every 4 (four) hours as needed for allergies.    [provider]  EPINEPHrine 0.3 mg/0.3 mL IJ SOAJ injection Inject into the muscle. 04/03/23    [provider]  famotidine (PEPCID) 40 MG tablet Take 1 tablet (40 mg total) by mouth 2 (two) times daily. 02/15/23   Alfonse Spruce, MD  fluticasone (FLOVENT HFA) 44 MCG/ACT inhaler Inhale 2 puffs into the lungs 2 (two) times daily. 05/24/23   Hetty Blend, FNP  ipratropium (ATROVENT) 0.03 % nasal spray Place 2 sprays into both nostrils 3 (three) times daily as needed for rhinitis. 02/15/23   Alfonse Spruce, MD  levalbuterol [facility] HFA) 45 MCG/ACT inhaler Inhale 2 puffs into the lungs every 4 (four) hours as needed for wheezing. 03/15/23   Ambs, Norvel Richards, FNP  levalbuterol (XOPENEX) 0.63 MG/3ML nebulizer solution Take 3 mLs (0.63 mg total) by nebulization every 4 (four) hours as needed for wheezing or shortness of breath. Patient not taking: Reported on 05/19/2023 03/15/23   Hetty Blend, FNP  omeprazole (PRILOSEC) 40 MG capsule TAKE 1 CAPSULE BY MOUTH TWICE A DAY Patient not taking: Reported on 05/19/2023 01/30/23   Carlan, Leeroy Bock L, NP  pantoprazole (PROTONIX) 40 MG tablet TAKE 1 TABLET (40 MG TOTAL) BY MOUTH DAILY. TAKE 30-60 MIN BEFORE FIRST MEAL OF THE DAY 05/23/23   Nyoka Cowden, MD  potassium chloride SA (KLOR-CON M) 20 MEQ tablet Take 2 tablets (40 mEq total) by mouth 2 (two) times daily. Patient taking differently: Take 40 mEq by mouth daily. 02/06/23   Dunn, Tacey Ruiz, PA-C  rosuvastatin (CRESTOR) 5 MG tablet Take 1 tablet (5 mg total) by mouth every evening. 02/22/23   Strader, Lennart Pall, PA-C  traZODone (DESYREL) 50 MG tablet Take by mouth. 03/01/23   [provider]  valsartan-hydrochlorothiazide (DIOVAN-HCT) 160-25 MG tablet TAKE 1 TABLET BY MOUTH EVERY DAY 10/03/22   Nyoka Cowden, MD    Family History Family History  Problem Relation Age of Onset   Eczema Father    Asthma Father    Cancer Maternal Grandmother     Social History Social History   Tobacco Use   Smoking status: Former    Current packs/day: 0.00    Average packs/day: 0.8 packs/day for  15.0 years (11.3 ttl pk-yrs)    Types: Cigarettes    Start date: 11/27/1968    Quit date: 11/28/1983    Years since quitting: 39.6   Smokeless tobacco: Never  Vaping Use   Vaping status: Never Used  Substance Use Topics   Alcohol use: No   Drug use: No     Allergies   Patient has no known allergies.   Review of Systems Review of Systems Per HPI  Physical Exam Triage Vital Signs ED Triage Vitals  Encounter Vitals Group     BP 07/10/23 1039 (!) 174/82     Systolic BP Percentile --      Diastolic BP Percentile --      Pulse Rate 07/10/23 1039 73     Resp 07/10/23 1039 18     Temp 07/10/23 1039 98.6 F (37 C)     Temp Source 07/10/23 1039 Oral     SpO2  07/10/23 1039 96 %     Weight --      Height --      Head Circumference --      Peak Flow --      Pain Score 07/10/23 1040 9     Pain Loc --      Pain Education --      Exclude from Growth Chart --    No data found.  Updated Vital Signs BP (!) 174/82 (BP Location: Right Arm)   Pulse 73   Temp 98.6 F (37 C) (Oral)   Resp 18   SpO2 96%   Visual Acuity Right Eye Distance:   Left Eye Distance:   Bilateral Distance:    Right Eye Near:   Left Eye Near:    Bilateral Near:     Physical Exam Vitals and nursing note reviewed.  Constitutional:      General: She is not in acute distress.    Appearance: Normal appearance.  HENT:     Head: Normocephalic.  Eyes:     Extraocular Movements: Extraocular movements intact.     Pupils: Pupils are equal, round, and reactive to light.  Cardiovascular:     Rate and Rhythm: Normal rate and regular rhythm.     Pulses: Normal pulses.     Heart sounds: Normal heart sounds.  Pulmonary:     Effort: Pulmonary effort is normal.     Breath sounds: Normal breath sounds.  Abdominal:     General: Bowel sounds are normal.     Palpations: Abdomen is soft.     Tenderness: There is no abdominal tenderness.  Musculoskeletal:     Cervical back: Normal range of motion.      Lumbar back: Tenderness (Tenderness noted from L3-S1) present. No swelling, edema, deformity or signs of trauma. Decreased range of motion. Negative right straight leg raise test and negative left straight leg raise test.     Right hip: Tenderness present. No deformity. Decreased range of motion.  Lymphadenopathy:     Cervical: No cervical adenopathy.  Skin:    General: Skin is warm and dry.  Neurological:     General: No focal deficit present.     Mental Status: She is alert and oriented to person, place, and time.  Psychiatric:        Mood and Affect: Mood normal.        Behavior: Behavior normal.      UC Treatments / Results  Labs (all labs ordered are listed, but only abnormal results are displayed) Labs Reviewed - No data to display  EKG   Radiology DG Lumbar Spine Complete  Result Date: 07/10/2023 CLINICAL DATA:  Chronic right low back pain, worse over the last 3 weeks. EXAM: LUMBAR SPINE - COMPLETE 4+ VIEW COMPARISON:  Lumbar MRI 09/05/2013 FINDINGS: Substantial degenerative facet arthropathy at L4-5 and L5-S1 with 6 mm of chronic grade 1 anterolisthesis at L4-5. 3 mm of degenerative anterolisthesis at L5-S1 is new compared to the prior exam. There 3 mm of degenerative retrolisthesis at L2-3 which is stable. No lumbar spine fracture observed.  No pars defects identified. Sclerosis along both sacroiliac joints, slightly greater along the iliac sides, without observed erosions. Atherosclerosis is present, including aortoiliac atherosclerotic disease. IMPRESSION: 1. Lower lumbar facet arthropathy. Degenerative subluxations at L2-3, L4-5, and L5-S1. 2. Sclerosis along both sacroiliac joints, slightly greater along the iliac sides, without observed erosions. Appearance compatible with bilateral sacroiliitis. 3. No acute findings. 4. Atherosclerosis is present, including  aortoiliac atherosclerotic disease. Electronically Signed   By: Gaylyn Rong M.D.   On: 07/10/2023 11:46     Procedures Procedures (including critical care time)  Medications Ordered in UC Medications  dexamethasone (DECADRON) injection 10 mg (10 mg Intramuscular Given 07/10/23 1157)    Initial Impression / Assessment and Plan / UC Course  I have reviewed the triage vital signs and the nursing notes.  Pertinent labs & imaging results that were available during my care of the patient were reviewed by me and considered in my medical decision making (see chart for details).  The patient is well-appearing, she is in no acute distress, vital signs are stable.  X-ray shows degenerative changes in her lower back along with bilateral sacroiliitis.  Decadron 10 mg IM administered for inflammation.  Will start patient on prednisone 40 mg daily for the next 5 days for inflammation.  Supportive care recommendations were provided and discussed with the patient to include Tylenol for pain or discomfort, use of ice or heat, and recommending follow-up with orthopedics for further evaluation.  Patient was given information for [facility] and for [facility].  Patient is in agreement with this plan of care and verbalizes understanding.  All questions were answered.  Patient stable for discharge.   Final Clinical Impressions(s) / UC Diagnoses   Final diagnoses:  Bilateral sacroiliitis (HCC)  History of low back pain     Discharge Instructions      The x-ray shows that you do have degenerative changes in your lower back.  It also shows inflammation of your joints in your lower back and sacrum. Take medication as prescribed. Increase fluids and allow for plenty of rest. Recommend Tylenol arthritis strength 650 mg tablets for pain or discomfort. Apply ice or heat as needed.  Apply ice for pain or swelling, heat for spasm or stiffness.  Apply for 20 minutes, remove for 1 hour, repeat as needed. It is recommended that you follow-up with your primary care physician or with orthopedics for further  evaluation of your symptoms.  You can follow-up with [facility] or with [facility]. Follow-up as needed.     ED Prescriptions     Medication Sig Dispense Auth. Provider   predniSONE (DELTASONE) 20 MG tablet Take 2 tablets (40 mg total) by mouth daily with breakfast for 5 days. 10 tablet -Warren, Sadie Haber, NP      PDMP not reviewed this encounter.   Abran Cantor, NP 07/10/23 1158

## 2023-07-10 NOTE — ED Triage Notes (Signed)
Pt reports she is having low back and right side hip pain x 3 weeks.  Has worsened since onset  Took tylenol

## 2023-07-10 NOTE — Discharge Instructions (Signed)
The x-ray shows that you do have degenerative changes in your lower back.  It also shows inflammation of your joints in your lower back and sacrum. Take medication as prescribed. Increase fluids and allow for plenty of rest. Recommend Tylenol arthritis strength 650 mg tablets for pain or discomfort. Apply ice or heat as needed.  Apply ice for pain or swelling, heat for spasm or stiffness.  Apply for 20 minutes, remove for 1 hour, repeat as needed. It is recommended that you follow-up with your primary care physician or with orthopedics for further evaluation of your symptoms.  You can follow-up with [facility] or with [facility] [place]. Follow-up as needed.

## 2023-07-12 ENCOUNTER — Ambulatory Visit (INDEPENDENT_AMBULATORY_CARE_PROVIDER_SITE_OTHER): Payer: Medicare Other

## 2023-07-12 DIAGNOSIS — J309 Allergic rhinitis, unspecified: Secondary | ICD-10-CM

## 2023-07-19 ENCOUNTER — Ambulatory Visit (INDEPENDENT_AMBULATORY_CARE_PROVIDER_SITE_OTHER): Payer: Self-pay

## 2023-07-19 DIAGNOSIS — J309 Allergic rhinitis, unspecified: Secondary | ICD-10-CM | POA: Diagnosis not present

## 2023-07-21 DIAGNOSIS — M5441 Lumbago with sciatica, right side: Secondary | ICD-10-CM | POA: Diagnosis not present

## 2023-07-21 DIAGNOSIS — M461 Sacroiliitis, not elsewhere classified: Secondary | ICD-10-CM | POA: Diagnosis not present

## 2023-07-21 DIAGNOSIS — M5440 Lumbago with sciatica, unspecified side: Secondary | ICD-10-CM | POA: Insufficient documentation

## 2023-07-26 ENCOUNTER — Ambulatory Visit (INDEPENDENT_AMBULATORY_CARE_PROVIDER_SITE_OTHER): Payer: Self-pay | Admitting: Allergy & Immunology

## 2023-07-26 DIAGNOSIS — J309 Allergic rhinitis, unspecified: Secondary | ICD-10-CM

## 2023-08-01 ENCOUNTER — Telehealth: Payer: Self-pay | Admitting: Gastroenterology

## 2023-08-01 NOTE — Telephone Encounter (Signed)
Inbound call from patient requesting to cancel procedure for 11/6 with Dr. Lavon Paganini at the hospital.   Please advise.

## 2023-08-01 NOTE — Telephone Encounter (Signed)
Thanks for the update

## 2023-08-01 NOTE — Telephone Encounter (Signed)
Spoke with the patient. She would like to cancel and not reschedule at this time. She will call us at a later date if she finds she will be able to have the esophageal manometry with pH impedance. Referring office will be notified.

## 2023-08-02 ENCOUNTER — Ambulatory Visit (INDEPENDENT_AMBULATORY_CARE_PROVIDER_SITE_OTHER): Payer: Medicare Other

## 2023-08-02 DIAGNOSIS — J309 Allergic rhinitis, unspecified: Secondary | ICD-10-CM | POA: Diagnosis not present

## 2023-08-09 ENCOUNTER — Ambulatory Visit (INDEPENDENT_AMBULATORY_CARE_PROVIDER_SITE_OTHER): Payer: Self-pay

## 2023-08-09 DIAGNOSIS — J309 Allergic rhinitis, unspecified: Secondary | ICD-10-CM

## 2023-08-16 ENCOUNTER — Ambulatory Visit (INDEPENDENT_AMBULATORY_CARE_PROVIDER_SITE_OTHER): Payer: Medicare Other

## 2023-08-16 DIAGNOSIS — J309 Allergic rhinitis, unspecified: Secondary | ICD-10-CM

## 2023-08-21 ENCOUNTER — Other Ambulatory Visit: Payer: Self-pay | Admitting: Allergy & Immunology

## 2023-08-21 ENCOUNTER — Other Ambulatory Visit: Payer: Self-pay | Admitting: Internal Medicine

## 2023-08-22 NOTE — Patient Instructions (Incomplete)
Asthma Not well-controlled Begin Dulera 100-2 puffs twice a day with a spacer to prevent cough or wheeze. Put this inhaler by your tooth brush and use twice a day Continue levalbuterol 2 puffs every 4 hours as needed for cough or wheeze OR instead use levalbuterol 0.083% solution via nebulizer one unit vial every 4 hours as needed for cough or wheeze   Mixed rhinitis Not well-controlled Continue allergen avoidance measures directed toward tree pollen as listed below Continue an antihistamine once a day as needed for runny nose or itch.  Patient cautioned regarding sedation with antihistamine Begin Flonase 2 sprays in each nostril once a day as needed for a stuffy nose.  In the right nostril, point the applicator out toward the right ear. In the left nostril, point the applicator out toward the left ear Continue saline rinses to daily as needed for nasal symptoms Continue ipratropium 2 sprays in each nostril twice a day as needed for runny nose Continue allergen immunotherapy directed towards tree and have access to an epinephrine autoinjector set per injection protocol  Reflux Stable Continue dietary and lifestyle modifications as listed below Continue famotidine twice a day and omeprazole twice a day as previously prescribed  Call the clinic if this treatment plan is not working well for you.  Follow up in 2 months or sooner if needed.  Reducing Pollen Exposure The American Academy of Allergy, Asthma and Immunology suggests the following steps to reduce your exposure to pollen during allergy seasons. Do not hang sheets or clothing out to dry; pollen may collect on these items. Do not mow lawns or spend time around freshly cut grass; mowing stirs up pollen. Keep windows closed at night.  Keep car windows closed while driving. Minimize morning activities outdoors, a time when pollen counts are usually at their highest. Stay indoors as much as possible when pollen counts or humidity is  high and on windy days when pollen tends to remain in the air longer. Use air conditioning when possible.  Many air conditioners have filters that trap the pollen spores. Use a HEPA room air filter to remove pollen form the indoor air you breathe.

## 2023-08-22 NOTE — Progress Notes (Signed)
[address] [place] [address] Dept: [phone]  FOLLOW UP NOTE  Patient ID: Tammy [name], female    DOB: 1945/12/14  Age: 77 y.o. MRN: [id number] Date of Office Visit: 08/23/2023  Assessment  Chief Complaint: Follow-up (No concerns)  HPI Tammy [name] is a 77 year old female who presents to the clinic for follow-up visit.  She was last seen in this clinic on 05/19/2023 by Thermon Leyland, FNP, for evaluation of asthma, allergic rhinitis, chronic cough and complaint of reflux.    At today's visit, she reports that her asthma has been moderately well-controlled with shortness of breath occurring with coughing and talking, intermittent wheezing occurring in the daytime and nighttime, and cough producing thick mucus which began a couple of weeks ago.  She continues Flovent 44-2 puffs 3-4 times a day and uses albuterol twice a day.  She reports weather change as her main asthma driver at this time.  Allergic rhinitis is reported as moderately well-controlled with postnasal drainage and frequent throat clearing as the main symptoms. She does report sneezing occurring in the fall and with weather changes.  She continues ipratropium daily and cetirizine 10 mg once a day.  She reports that she occasionally uses Benadryl.  She continues Flonase only as needed which is infrequently at this time.  She is not currently using a nasal saline rinse.  She began allergen immunotherapy directed toward trees on 04/05/2023.  She continues allergen immunotherapy with no large or local reactions.  She reports a modest decrease in her symptoms of allergic rhinitis while continuing on allergen immunotherapy.  Reflux is reported as well-controlled with no symptoms including heartburn or vomiting.  She continues omeprazole twice a day and famotidine twice a day as previously prescribed with relief of symptoms.  Her current medications are listed in the chart.  Drug Allergies:  No Known  Allergies  Physical Exam: BP 120/70   Pulse 89   Temp 98.8 F (37.1 C)   Ht 5\' 5"  (1.651 m)   Wt 192 lb 6.4 oz (87.3 kg)   SpO2 96%   BMI 32.02 kg/m    Physical Exam Vitals reviewed.  Constitutional:      Appearance: Normal appearance.  HENT:     Head: Normocephalic and atraumatic.     Right Ear: Tympanic membrane normal.     Left Ear: Tympanic membrane normal.     Nose:     Comments: Bilateral naris slightly erythematous with thin clear nasal drainage noted.  Pharynx slightly erythematous with no exudate.  Ears normal.  Eyes normal. Eyes:     Conjunctiva/sclera: Conjunctivae normal.  Cardiovascular:     Rate and Rhythm: Normal rate and regular rhythm.     Heart sounds: Normal heart sounds. No murmur heard. Pulmonary:     Effort: Pulmonary effort is normal.     Breath sounds: Normal breath sounds.     Comments: Lungs clear to auscultation Musculoskeletal:        General: Normal range of motion.     Cervical back: Normal range of motion and neck supple.  Skin:    General: Skin is warm and dry.  Neurological:     Mental Status: She is alert and oriented to person, place, and time.  Psychiatric:        Mood and Affect: Mood normal.        Behavior: Behavior normal.        Thought Content: Thought content normal.  Judgment: Judgment normal.     Diagnostics: FVC 1.99 which is 87% of predicted value, FEV1 1.49 which is 85% of predicted value.  Spirometry indicates normal ventilatory function.  Assessment and Plan: 1. Not well controlled moderate persistent asthma   2. Seasonal allergic rhinitis due to pollen   3. Gastroesophageal reflux disease, unspecified whether esophagitis present     Meds ordered this encounter  Medications   mometasone-formoterol (DULERA) 100-5 MCG/ACT AERO    Sig: Inhale 2 puffs into the lungs 2 (two) times daily.    Dispense:  1 each    Refill:  5    Patient Instructions  Asthma Not well-controlled Begin Dulera 100-2 puffs  twice a day with a spacer to prevent cough or wheeze. Put this inhaler by your tooth brush and use twice a day Continue levalbuterol 2 puffs every 4 hours as needed for cough or wheeze OR instead use levalbuterol 0.083% solution via nebulizer one unit vial every 4 hours as needed for cough or wheeze   Mixed rhinitis Not well-controlled Continue allergen avoidance measures directed toward tree pollen as listed below Continue an antihistamine once a day as needed for runny nose or itch.  Patient cautioned regarding sedation with antihistamine Begin Flonase 2 sprays in each nostril once a day as needed for a stuffy nose.  In the right nostril, point the applicator out toward the right ear. In the left nostril, point the applicator out toward the left ear Continue saline rinses to daily as needed for nasal symptoms Continue ipratropium 2 sprays in each nostril twice a day as needed for runny nose Continue allergen immunotherapy directed towards tree and have access to an epinephrine autoinjector set per injection protocol  Reflux Stable Continue dietary and lifestyle modifications as listed below Continue famotidine twice a day and omeprazole twice a day as previously prescribed  Call the clinic if this treatment plan is not working well for you.  Follow up in 2 months or sooner if needed.   Return in about 2 months (around 10/23/2023), or if symptoms worsen or fail to improve.    Thank you for the opportunity to care for this patient.  Please do not hesitate to contact me with questions.  Thermon Leyland, FNP [facility]

## 2023-08-23 ENCOUNTER — Other Ambulatory Visit: Payer: Self-pay | Admitting: Allergy & Immunology

## 2023-08-23 ENCOUNTER — Other Ambulatory Visit: Payer: Self-pay

## 2023-08-23 ENCOUNTER — Encounter: Payer: Self-pay | Admitting: Family Medicine

## 2023-08-23 ENCOUNTER — Ambulatory Visit (INDEPENDENT_AMBULATORY_CARE_PROVIDER_SITE_OTHER): Payer: Medicare Other | Admitting: Family Medicine

## 2023-08-23 VITALS — BP 120/70 | HR 89 | Temp 98.8°F | Ht 65.0 in | Wt 192.4 lb

## 2023-08-23 DIAGNOSIS — K219 Gastro-esophageal reflux disease without esophagitis: Secondary | ICD-10-CM | POA: Diagnosis not present

## 2023-08-23 DIAGNOSIS — J301 Allergic rhinitis due to pollen: Secondary | ICD-10-CM | POA: Diagnosis not present

## 2023-08-23 DIAGNOSIS — J4540 Moderate persistent asthma, uncomplicated: Secondary | ICD-10-CM | POA: Diagnosis not present

## 2023-08-23 MED ORDER — LEVALBUTEROL HCL 0.63 MG/3ML IN NEBU: 0.6300 mg | INHALATION_SOLUTION | RESPIRATORY_TRACT | 2 refills | Status: DC | PRN

## 2023-08-23 MED ORDER — DULERA 100-5 MCG/ACT IN AERO: 2.0000 | INHALATION_SPRAY | Freq: Two times a day (BID) | RESPIRATORY_TRACT | 5 refills | Status: DC

## 2023-08-23 MED ORDER — LEVALBUTEROL TARTRATE 45 MCG/ACT IN AERO: 2.0000 | INHALATION_SPRAY | RESPIRATORY_TRACT | 2 refills | Status: DC | PRN

## 2023-08-23 NOTE — Addendum Note (Signed)
Addended by: Alfonse Spruce on: 08/23/2023 02:20 PM   Modules accepted: Orders

## 2023-08-25 ENCOUNTER — Other Ambulatory Visit: Payer: Self-pay | Admitting: Allergy & Immunology

## 2023-08-25 DIAGNOSIS — E782 Mixed hyperlipidemia: Secondary | ICD-10-CM | POA: Diagnosis not present

## 2023-08-25 DIAGNOSIS — R7303 Prediabetes: Secondary | ICD-10-CM | POA: Diagnosis not present

## 2023-08-25 NOTE — Addendum Note (Signed)
Addended by: Philipp Deputy on: 08/25/2023 11:35 AM   Modules accepted: Orders

## 2023-08-28 DIAGNOSIS — G894 Chronic pain syndrome: Secondary | ICD-10-CM | POA: Diagnosis not present

## 2023-08-28 DIAGNOSIS — M545 Low back pain, unspecified: Secondary | ICD-10-CM | POA: Diagnosis not present

## 2023-08-28 DIAGNOSIS — N390 Urinary tract infection, site not specified: Secondary | ICD-10-CM | POA: Diagnosis not present

## 2023-08-30 ENCOUNTER — Ambulatory Visit (INDEPENDENT_AMBULATORY_CARE_PROVIDER_SITE_OTHER): Payer: Self-pay

## 2023-08-30 DIAGNOSIS — D599 Acquired hemolytic anemia, unspecified: Secondary | ICD-10-CM | POA: Diagnosis not present

## 2023-08-30 DIAGNOSIS — M069 Rheumatoid arthritis, unspecified: Secondary | ICD-10-CM | POA: Diagnosis not present

## 2023-08-30 DIAGNOSIS — J309 Allergic rhinitis, unspecified: Secondary | ICD-10-CM

## 2023-08-30 DIAGNOSIS — D519 Vitamin B12 deficiency anemia, unspecified: Secondary | ICD-10-CM | POA: Diagnosis not present

## 2023-08-30 DIAGNOSIS — D649 Anemia, unspecified: Secondary | ICD-10-CM | POA: Diagnosis not present

## 2023-08-31 DIAGNOSIS — J302 Other seasonal allergic rhinitis: Secondary | ICD-10-CM | POA: Diagnosis not present

## 2023-08-31 DIAGNOSIS — K219 Gastro-esophageal reflux disease without esophagitis: Secondary | ICD-10-CM | POA: Diagnosis not present

## 2023-08-31 DIAGNOSIS — D509 Iron deficiency anemia, unspecified: Secondary | ICD-10-CM | POA: Diagnosis not present

## 2023-08-31 DIAGNOSIS — N289 Disorder of kidney and ureter, unspecified: Secondary | ICD-10-CM | POA: Diagnosis not present

## 2023-08-31 DIAGNOSIS — J4551 Severe persistent asthma with (acute) exacerbation: Secondary | ICD-10-CM | POA: Diagnosis not present

## 2023-08-31 DIAGNOSIS — R051 Acute cough: Secondary | ICD-10-CM | POA: Diagnosis not present

## 2023-08-31 DIAGNOSIS — Z03818 Encounter for observation for suspected exposure to other biological agents ruled out: Secondary | ICD-10-CM | POA: Diagnosis not present

## 2023-08-31 DIAGNOSIS — R053 Chronic cough: Secondary | ICD-10-CM | POA: Diagnosis not present

## 2023-08-31 DIAGNOSIS — G4700 Insomnia, unspecified: Secondary | ICD-10-CM | POA: Diagnosis not present

## 2023-08-31 DIAGNOSIS — I10 Essential (primary) hypertension: Secondary | ICD-10-CM | POA: Diagnosis not present

## 2023-08-31 DIAGNOSIS — R531 Weakness: Secondary | ICD-10-CM | POA: Diagnosis not present

## 2023-08-31 DIAGNOSIS — E669 Obesity, unspecified: Secondary | ICD-10-CM | POA: Diagnosis not present

## 2023-08-31 DIAGNOSIS — R0602 Shortness of breath: Secondary | ICD-10-CM | POA: Diagnosis not present

## 2023-08-31 DIAGNOSIS — E1165 Type 2 diabetes mellitus with hyperglycemia: Secondary | ICD-10-CM | POA: Diagnosis not present

## 2023-08-31 DIAGNOSIS — E782 Mixed hyperlipidemia: Secondary | ICD-10-CM | POA: Diagnosis not present

## 2023-08-31 DIAGNOSIS — R32 Unspecified urinary incontinence: Secondary | ICD-10-CM | POA: Diagnosis not present

## 2023-09-06 ENCOUNTER — Ambulatory Visit (INDEPENDENT_AMBULATORY_CARE_PROVIDER_SITE_OTHER): Payer: Self-pay

## 2023-09-06 DIAGNOSIS — J309 Allergic rhinitis, unspecified: Secondary | ICD-10-CM | POA: Diagnosis not present

## 2023-09-13 ENCOUNTER — Ambulatory Visit (INDEPENDENT_AMBULATORY_CARE_PROVIDER_SITE_OTHER): Payer: Self-pay

## 2023-09-13 DIAGNOSIS — J309 Allergic rhinitis, unspecified: Secondary | ICD-10-CM

## 2023-09-15 DIAGNOSIS — Z23 Encounter for immunization: Secondary | ICD-10-CM | POA: Diagnosis not present

## 2023-09-20 ENCOUNTER — Ambulatory Visit (INDEPENDENT_AMBULATORY_CARE_PROVIDER_SITE_OTHER): Payer: Medicare Other

## 2023-09-20 DIAGNOSIS — J309 Allergic rhinitis, unspecified: Secondary | ICD-10-CM | POA: Diagnosis not present

## 2023-09-22 DIAGNOSIS — Z23 Encounter for immunization: Secondary | ICD-10-CM | POA: Diagnosis not present

## 2023-09-27 ENCOUNTER — Ambulatory Visit (INDEPENDENT_AMBULATORY_CARE_PROVIDER_SITE_OTHER): Payer: Medicare Other

## 2023-09-27 DIAGNOSIS — J309 Allergic rhinitis, unspecified: Secondary | ICD-10-CM

## 2023-10-04 ENCOUNTER — Encounter (HOSPITAL_COMMUNITY): Admission: RE | Payer: Self-pay | Source: Home / Self Care

## 2023-10-04 ENCOUNTER — Ambulatory Visit (INDEPENDENT_AMBULATORY_CARE_PROVIDER_SITE_OTHER): Payer: Medicare Other

## 2023-10-04 ENCOUNTER — Ambulatory Visit (HOSPITAL_COMMUNITY): Admission: RE | Admit: 2023-10-04 | Payer: Medicare Other | Source: Home / Self Care | Admitting: Gastroenterology

## 2023-10-04 DIAGNOSIS — J309 Allergic rhinitis, unspecified: Secondary | ICD-10-CM

## 2023-10-04 SURGERY — MANOMETRY, ESOPHAGUS
Anesthesia: Choice

## 2023-10-11 ENCOUNTER — Ambulatory Visit (INDEPENDENT_AMBULATORY_CARE_PROVIDER_SITE_OTHER): Payer: Medicare Other

## 2023-10-11 DIAGNOSIS — J309 Allergic rhinitis, unspecified: Secondary | ICD-10-CM | POA: Diagnosis not present

## 2023-10-16 DIAGNOSIS — Z0000 Encounter for general adult medical examination without abnormal findings: Secondary | ICD-10-CM | POA: Diagnosis not present

## 2023-10-18 ENCOUNTER — Ambulatory Visit (INDEPENDENT_AMBULATORY_CARE_PROVIDER_SITE_OTHER): Payer: Self-pay

## 2023-10-18 DIAGNOSIS — J309 Allergic rhinitis, unspecified: Secondary | ICD-10-CM

## 2023-10-24 NOTE — Progress Notes (Signed)
   [address] [place] [address] Dept: [phone]  FOLLOW UP NOTE  Patient ID: Tammy [name], female    DOB: 05-Feb-1946  Age: 77 y.o. MRN: [id number] Date of Office Visit: 10/25/2023  Assessment  Chief Complaint: No chief complaint on file.  HPI Tammy [name] is a 77 year old female who presents to the clinic for follow-up visit.  She was last seen in this clinic on 08/23/2023 by Thermon Leyland, FNP, for evaluation of asthma, allergic rhinitis on allergen immunotherapy, vasomotor rhinitis, and reflux.She began allergen immunotherapy directed toward trees on 04/05/2023.   Discussed the use of AI scribe software for clinical note transcription with the patient, who gave verbal consent to proceed.  History of Present Illness             Drug Allergies:  No Known Allergies  Physical Exam: There were no vitals taken for this visit.   Physical Exam  Diagnostics:    Assessment and Plan: No diagnosis found.  No orders of the defined types were placed in this encounter.   There are no Patient Instructions on file for this visit.  No follow-ups on file.    Thank you for the opportunity to care for this patient.  Please do not hesitate to contact me with questions.  Thermon Leyland, FNP [facility]

## 2023-10-24 NOTE — Patient Instructions (Incomplete)
Asthma Not well-controlled Prednisone 10 mg tablets. Take 2 tablets once a day for 4 days, then take 1 tablet on the 5th day, then stop.   Continue Dulera 100-2 puffs twice a day with a spacer to prevent cough or wheeze. Put this inhaler by your tooth brush and use twice a day Continue levalbuterol 2 puffs every 4 hours as needed for cough or wheeze OR instead use levalbuterol 0.083% solution via nebulizer one unit vial every 4 hours as needed for cough or wheeze   Acute sinusitis Begin Augmentin 875 mg twice a day for 10 days Continue nasal saline rinses For thick post nasal drainage, begin Mucinex [phone] mg twice  a day  Mixed rhinitis Not well-controlled Continue allergen avoidance measures directed toward tree pollen as listed below Continue an antihistamine once a day as needed for runny nose or itch.  Patient cautioned regarding sedation with antihistamine Begin Flonase 2 sprays in each nostril once a day as needed for a stuffy nose.  In the right nostril, point the applicator out toward the right ear. In the left nostril, point the applicator out toward the left ear Continue saline rinses to daily as needed for nasal symptoms Continue ipratropium 2 sprays in each nostril twice a day as needed for runny nose Continue allergen immunotherapy directed towards tree and have access to an epinephrine autoinjector set per injection protocol  Reflux Stable Continue dietary and lifestyle modifications as listed below Continue famotidine twice a day and omeprazole twice a day as previously prescribed  Call the clinic if this treatment plan is not working well for you.  Follow up in 2 months or sooner if needed.  Reducing Pollen Exposure The American Academy of Allergy, Asthma and Immunology suggests the following steps to reduce your exposure to pollen during allergy seasons. Do not hang sheets or clothing out to dry; pollen may collect on these items. Do not mow lawns or spend time  around freshly cut grass; mowing stirs up pollen. Keep windows closed at night.  Keep car windows closed while driving. Minimize morning activities outdoors, a time when pollen counts are usually at their highest. Stay indoors as much as possible when pollen counts or humidity is high and on windy days when pollen tends to remain in the air longer. Use air conditioning when possible.  Many air conditioners have filters that trap the pollen spores. Use a HEPA room air filter to remove pollen form the indoor air you breathe.

## 2023-10-25 ENCOUNTER — Other Ambulatory Visit: Payer: Self-pay

## 2023-10-25 ENCOUNTER — Ambulatory Visit: Payer: Self-pay | Admitting: *Deleted

## 2023-10-25 ENCOUNTER — Encounter: Payer: Self-pay | Admitting: Family Medicine

## 2023-10-25 ENCOUNTER — Ambulatory Visit (INDEPENDENT_AMBULATORY_CARE_PROVIDER_SITE_OTHER): Payer: Medicare Other | Admitting: Family Medicine

## 2023-10-25 VITALS — BP 120/74 | HR 83 | Temp 97.4°F | Resp 14 | Wt 195.2 lb

## 2023-10-25 DIAGNOSIS — J4540 Moderate persistent asthma, uncomplicated: Secondary | ICD-10-CM | POA: Diagnosis not present

## 2023-10-25 DIAGNOSIS — J0100 Acute maxillary sinusitis, unspecified: Secondary | ICD-10-CM | POA: Diagnosis not present

## 2023-10-25 DIAGNOSIS — J301 Allergic rhinitis due to pollen: Secondary | ICD-10-CM | POA: Diagnosis not present

## 2023-10-25 DIAGNOSIS — K219 Gastro-esophageal reflux disease without esophagitis: Secondary | ICD-10-CM | POA: Diagnosis not present

## 2023-10-25 DIAGNOSIS — J309 Allergic rhinitis, unspecified: Secondary | ICD-10-CM

## 2023-10-25 MED ORDER — AMOXICILLIN-POT CLAVULANATE 875-125 MG PO TABS: 1.0000 | ORAL_TABLET | Freq: Two times a day (BID) | ORAL | 0 refills | Status: DC

## 2023-10-25 MED ORDER — PREDNISONE 10 MG PO TABS: ORAL_TABLET | ORAL | 0 refills | Status: DC

## 2023-10-25 NOTE — Addendum Note (Signed)
Addended by: Elsworth Soho on: 10/25/2023 05:16 PM   Modules accepted: Orders

## 2023-11-01 ENCOUNTER — Ambulatory Visit (INDEPENDENT_AMBULATORY_CARE_PROVIDER_SITE_OTHER): Payer: Medicare Other

## 2023-11-01 DIAGNOSIS — J309 Allergic rhinitis, unspecified: Secondary | ICD-10-CM | POA: Diagnosis not present

## 2023-11-08 ENCOUNTER — Ambulatory Visit (INDEPENDENT_AMBULATORY_CARE_PROVIDER_SITE_OTHER): Payer: Self-pay

## 2023-11-08 DIAGNOSIS — J309 Allergic rhinitis, unspecified: Secondary | ICD-10-CM | POA: Diagnosis not present

## 2023-11-14 ENCOUNTER — Other Ambulatory Visit: Payer: Self-pay | Admitting: Internal Medicine

## 2023-11-15 ENCOUNTER — Ambulatory Visit (INDEPENDENT_AMBULATORY_CARE_PROVIDER_SITE_OTHER): Payer: Medicare Other

## 2023-11-15 DIAGNOSIS — J309 Allergic rhinitis, unspecified: Secondary | ICD-10-CM

## 2023-11-16 ENCOUNTER — Other Ambulatory Visit (HOSPITAL_COMMUNITY): Payer: Self-pay | Admitting: Internal Medicine

## 2023-11-16 DIAGNOSIS — Z1231 Encounter for screening mammogram for malignant neoplasm of breast: Secondary | ICD-10-CM

## 2023-11-19 ENCOUNTER — Other Ambulatory Visit: Payer: Self-pay | Admitting: Internal Medicine

## 2023-11-20 ENCOUNTER — Ambulatory Visit (INDEPENDENT_AMBULATORY_CARE_PROVIDER_SITE_OTHER): Payer: Self-pay

## 2023-11-20 DIAGNOSIS — J309 Allergic rhinitis, unspecified: Secondary | ICD-10-CM

## 2023-12-01 ENCOUNTER — Ambulatory Visit (INDEPENDENT_AMBULATORY_CARE_PROVIDER_SITE_OTHER): Payer: Self-pay

## 2023-12-01 DIAGNOSIS — J309 Allergic rhinitis, unspecified: Secondary | ICD-10-CM | POA: Diagnosis not present

## 2023-12-18 ENCOUNTER — Other Ambulatory Visit: Payer: Self-pay | Admitting: Family Medicine

## 2023-12-18 ENCOUNTER — Ambulatory Visit (INDEPENDENT_AMBULATORY_CARE_PROVIDER_SITE_OTHER): Payer: Self-pay

## 2023-12-18 DIAGNOSIS — J309 Allergic rhinitis, unspecified: Secondary | ICD-10-CM

## 2023-12-27 ENCOUNTER — Ambulatory Visit (INDEPENDENT_AMBULATORY_CARE_PROVIDER_SITE_OTHER): Payer: Self-pay | Admitting: *Deleted

## 2023-12-27 DIAGNOSIS — J309 Allergic rhinitis, unspecified: Secondary | ICD-10-CM | POA: Diagnosis not present

## 2023-12-29 ENCOUNTER — Ambulatory Visit (HOSPITAL_COMMUNITY): Payer: Medicare Other

## 2024-01-03 ENCOUNTER — Ambulatory Visit (INDEPENDENT_AMBULATORY_CARE_PROVIDER_SITE_OTHER): Payer: Medicare Other | Admitting: Allergy & Immunology

## 2024-01-03 ENCOUNTER — Encounter: Payer: Self-pay | Admitting: Allergy & Immunology

## 2024-01-03 ENCOUNTER — Other Ambulatory Visit: Payer: Self-pay

## 2024-01-03 VITALS — BP 128/70 | HR 91 | Temp 98.0°F | Resp 16 | Ht 62.6 in | Wt 186.0 lb

## 2024-01-03 DIAGNOSIS — K219 Gastro-esophageal reflux disease without esophagitis: Secondary | ICD-10-CM | POA: Diagnosis not present

## 2024-01-03 DIAGNOSIS — R053 Chronic cough: Secondary | ICD-10-CM | POA: Diagnosis not present

## 2024-01-03 DIAGNOSIS — J4540 Moderate persistent asthma, uncomplicated: Secondary | ICD-10-CM

## 2024-01-03 DIAGNOSIS — H1013 Acute atopic conjunctivitis, bilateral: Secondary | ICD-10-CM | POA: Diagnosis not present

## 2024-01-03 DIAGNOSIS — J309 Allergic rhinitis, unspecified: Secondary | ICD-10-CM | POA: Diagnosis not present

## 2024-01-03 DIAGNOSIS — H1010 Acute atopic conjunctivitis, unspecified eye: Secondary | ICD-10-CM

## 2024-01-03 MED ORDER — BENZONATATE 100 MG PO CAPS: 100.0000 mg | ORAL_CAPSULE | Freq: Three times a day (TID) | ORAL | 0 refills | Status: DC | PRN

## 2024-01-03 MED ORDER — IPRATROPIUM BROMIDE 0.03 % NA SOLN: 2.0000 | Freq: Three times a day (TID) | NASAL | 5 refills | Status: DC | PRN

## 2024-01-03 MED ORDER — FAMOTIDINE 40 MG PO TABS: 40.0000 mg | ORAL_TABLET | Freq: Two times a day (BID) | ORAL | 1 refills | Status: DC

## 2024-01-03 MED ORDER — DULERA 100-5 MCG/ACT IN AERO: 2.0000 | INHALATION_SPRAY | Freq: Two times a day (BID) | RESPIRATORY_TRACT | 5 refills | Status: DC

## 2024-01-03 MED ORDER — LEVALBUTEROL HCL 0.63 MG/3ML IN NEBU: 0.6300 mg | INHALATION_SOLUTION | Freq: Four times a day (QID) | RESPIRATORY_TRACT | 2 refills | Status: DC | PRN

## 2024-01-03 NOTE — Patient Instructions (Addendum)
 1. Moderate persistent asthma, uncomplicated - Lung testing looks great today. - Because you are needing your emergency inhaler so frequently, we are going to ADD ON Spiriva 1.25mcg two puffs once daily (sample provided). - Call us  if you think that this is helpful and we can send it in.  - I am fine with re-filling your Tessalon  pearls. - Daily controller medication(s): Dulera  100/84mcg two puffs twice daily with spacer and Spiriva 1.25mcg two puffs once daily - Prior to physical activity: Xopenex  2 puffs 10-15 minutes before physical activity. - Rescue medications: Xopenex  4 puffs every 4-6 hours as needed - Asthma control goals:  * Full participation in all desired activities (may need albuterol  before activity) * Albuterol  use two time or less a week on average (not counting use with activity) * Cough interfering with sleep two time or less a month * Oral steroids no more than once a year * No hospitalizations  2. Mixed rhinitis - Previous testing showed: trees - Continue with allergy  shots at the same schedule (they seem to be working). - Continue taking: Chlortab per Dr. Darlean - Continue taking: Flonase  (fluticasone ) one spray per nostril daily (AIM FOR EAR ON EACH SIDE) and Atrovent  (ipratropium) 0.03% one spray per nostril 2-3 times daily as needed (CAN BE OVER DRYING) - You can use an extra dose of the antihistamine, if needed, for breakthrough symptoms.  - Consider nasal saline rinses 1-2 times daily to remove allergens from the nasal cavities as well as help with mucous clearance (this is especially helpful to do before the nasal sprays are given)  3. Gastroesophageal reflux disease - Continue with Pepcid  (famotidine ) 40mg  twice daily.   4. Return in about 6 months (around 07/02/2024).    Please inform us  of any Emergency Department visits, hospitalizations, or changes in symptoms. Call us  before going to the ED for breathing or allergy  symptoms since we might be able to fit you  in for a sick visit. Feel free to contact us  anytime with any questions, problems, or concerns.  It was a pleasure to see you today!  Websites that have reliable patient information: 1. American Academy of Asthma, Allergy , and Immunology: www.aaaai.org 2. Food Allergy  Research and Education (FARE): foodallergy.org 3. Mothers of Asthmatics: http://www.asthmacommunitynetwork.org 4. American College of Allergy , Asthma, and Immunology: www.acaai.org   COVID-19 Vaccine Information can be found at: [email or web address] For questions related to vaccine distribution or appointments, please email [email or web address] or call [phone].   We realize that you might be concerned about having an allergic reaction to the COVID19 vaccines. To help with that concern, WE ARE OFFERING THE COVID19 VACCINES IN OUR OFFICE! Ask the front desk for dates!     "Like" us  on Facebook and Instagram for our latest updates!      A healthy democracy works best when [employer] participate! Make sure you are registered to vote! If you have moved or changed any of your contact information, you will need to get this updated before voting!  In some cases, you MAY be able to register to vote online: [email or web address]

## 2024-01-03 NOTE — Progress Notes (Signed)
 FOLLOW UP  Date of Service/Encounter:  01/03/24   Assessment:   Chronic cough - with some improvement on Dulera  (followed with Dr. Darlean in the past)   Mixed rhinitis (trees) - on allergen immunotherapy   Gastroesophageal reflux disease - on famotidine  and pantoprazole   Failed Breztri in the past  Adverse reaction to PPIs (joint plan)   Similar to her rhinitis, I think that her cough is likely multifactorial.  She does report improvement since starting the Dulera .  She does not remember he started back, but it seems to have helped.  She has been on Breztri and had a bad reaction to this.  Therefore, I recommended that she try using Spiriva instead.  This is an additive treatment to her Dulera .  We gave her a sample to try so that she did not have to pay for it.  She is going to tell us  next week how she feels with that.  I did refill her Tessalon  Perles for 1 more refill.  It does not seem that she is overusing these at all.  She is going to make an appointment with Dr. Darlean again which I think will be helpful.    Plan/Recommendations:   1. Moderate persistent asthma, uncomplicated - Lung testing looks great today. - Because you are needing your emergency inhaler so frequently, we are going to ADD ON Spiriva 1.25mcg two puffs once daily (sample provided). - Call us  if you think that this is helpful and we can send it in.  - I am fine with re-filling your Tessalon  pearls. - Daily controller medication(s): Dulera  100/78mcg two puffs twice daily with spacer and Spiriva 1.25mcg two puffs once daily - Prior to physical activity: Xopenex  2 puffs 10-15 minutes before physical activity. - Rescue medications: Xopenex  4 puffs every 4-6 hours as needed - Asthma control goals:  * Full participation in all desired activities (may need albuterol  before activity) * Albuterol  use two time or less a week on average (not counting use with activity) * Cough interfering with sleep two time or less a  month * Oral steroids no more than once a year * No hospitalizations  2. Mixed rhinitis - Previous testing showed: trees - Continue with allergy  shots at the same schedule (they seem to be working). - Continue taking: Chlortab per Dr. Darlean - Continue taking: Flonase  (fluticasone ) one spray per nostril daily (AIM FOR EAR ON EACH SIDE) and Atrovent  (ipratropium) 0.03% one spray per nostril 2-3 times daily as needed (CAN BE OVER DRYING) - You can use an extra dose of the antihistamine, if needed, for breakthrough symptoms.  - Consider nasal saline rinses 1-2 times daily to remove allergens from the nasal cavities as well as help with mucous clearance (this is especially helpful to do before the nasal sprays are given)  3. Gastroesophageal reflux disease - Continue with Pepcid  (famotidine ) 40mg  twice daily.   4. Return in about 6 months (around 07/02/2024).   Subjective:   Tammy [name] is a 78 y.o. female presenting today for follow up of  Chief Complaint  Patient presents with   Follow-up    Breathing issues that comes and goes. Is the worst when bending down doing things.     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  Discussed the use of AI scribe software for clinical note transcription with the patient and/or guardian, who gave verbal consent to proceed.  Tammy [name] is a 78 y.o. female presenting for a follow up visit.  She was last seen in November 2024 by Arlean Mutter, one of our nurse practitioners.  At that time, she was started on a prednisone  taper.  She was continued on Dulera  100 mcg 2 puffs twice daily as well as Xopenex  as needed.  She was started on Augmentin  twice a day for 10 days for acute sinusitis.  She was continued on her current regimen for now and Flonase .  Allergy  shots were going well.  Reflux is stable with famotidine  and omeprazole .  Since last visit, she has mostly done well.   Asthma/Respiratory Symptom History: She has been experiencing a persistent cough for nearly two weeks, initially mild but worsening with the production of yellow phlegm that is difficult to expectorate. The cough has improved compared to when it first started. She experiences occasional chest pain when breathing, described as a 'lump' in her chest that sometimes resolves with coughing. She continues to use Dulera , two puffs twice a day, and has increased her use of the emergency inhaler to twice a day over the past couple of weeks. She has not been using the nebulizer due to it causing shortness of breath. She has a history of adverse reactions to Breztri and has not used Tessalon  Pearls recently but found them helpful at night when her cough was worse.  She does not routinely use Tessalon  Perles, but she has requesting a refill today.  She estimates that 1 container Tessalon  Perles usually last around 1 year.  She has not seen Dr. Darlean for a couple years, but she is going to call him to make an appointment.  She denies any history of smoking but mentions exposure to secondhand smoke from her  husband in the past. She is retired and actively involved in activities at her church.   Allergic Rhinitis Symptom History: She has been receiving allergy  shots, which she feels have been beneficial, allowing her to perform outdoor activities with a mask. She experiences worsening symptoms if she misses her shots.  She did reach her maintenance in December and feels optimistic about the coming spring.  Tammy [name] is on allergen immunotherapy. She receives one injection. Immunotherapy script #1 contains trees. She currently receives 0.30mL of the RED vial (1/100). She started shots May of 2024 and reached maintenance in December of 2024.  She has not had any large local reactions. ' GERD Symptom History: She takes famotidine  twice a day for reflux, having previously experienced joint pain with omeprazole  and pantoprazole .  She seems to be tolerating the famotidine  twice a day.  She is doing the 40 mg dose.  Otherwise, there have been no changes to her past medical history,  surgical history, family history, or social history.    Review of systems otherwise negative other than that mentioned in the HPI.    Objective:   Blood pressure 128/70, pulse 91, temperature 98 F (36.7 C), resp. rate 16, height 5' 2.6 (1.59 m), weight 186 lb (84.4 kg), SpO2 98%. Body mass index is 33.37 kg/m.    Physical Exam Vitals reviewed.  Constitutional:      Appearance: She is well-developed.     Comments: Hoarseness.  Pleasant.  HENT:     Head: Normocephalic and atraumatic.     Right Ear: Tympanic membrane, ear canal and external ear normal. No drainage, swelling or tenderness. Tympanic membrane is not injected, scarred, erythematous, retracted or bulging.     Left Ear: Tympanic membrane, ear canal and external ear normal. No drainage, swelling or tenderness. Tympanic membrane is not injected, scarred, erythematous, retracted or bulging.     Nose: Mucosal edema and rhinorrhea present. No nasal deformity or  septal deviation.     Right Sinus: No maxillary sinus tenderness or frontal sinus tenderness.     Left Sinus: No maxillary sinus tenderness or frontal sinus tenderness.     Comments: No polyps.     Mouth/Throat:     Mouth: Mucous membranes are not pale and not dry.     Pharynx: Uvula midline.  Eyes:     General:        Right eye: No discharge.        Left eye: No discharge.     Conjunctiva/sclera: Conjunctivae normal.     Right eye: Right conjunctiva is not injected. No chemosis.    Left eye: Left conjunctiva is not injected. No chemosis.    Pupils: Pupils are equal, round, and reactive to light.  Cardiovascular:     Rate and Rhythm: Normal rate and regular rhythm.     Heart sounds: Normal heart sounds.  Pulmonary:     Effort: Pulmonary effort is normal. No tachypnea, accessory muscle usage or respiratory distress.     Breath sounds: Normal breath sounds. Transmitted upper airway sounds present. No wheezing, rhonchi or rales.     Comments: Moving air well in all lung fields. No increased work of breathing noted.  No wheezing or crackles. Chest:     Chest wall: No tenderness.  Abdominal:     Tenderness: There is no abdominal tenderness. There is no guarding or rebound.  Lymphadenopathy:     Head:     Right side of head: No submandibular, tonsillar or occipital adenopathy.     Left side of head: No submandibular, tonsillar or occipital adenopathy.     Cervical: No cervical adenopathy.  Skin:    Coloration: Skin is not pale.     Findings: No abrasion, erythema, petechiae or rash. Rash is not papular, urticarial or vesicular.  Neurological:     Mental Status: She is alert.  Psychiatric:        Behavior: Behavior is cooperative.      Diagnostic studies:    Spirometry: results normal (FEV1: 1.60/103%, FVC: 2.36/116%, FEV1/FVC: 68%).    Spirometry consistent with normal pattern.   Allergy  Studies: none      Tammy Shaggy, MD  [facility]

## 2024-01-04 ENCOUNTER — Ambulatory Visit (HOSPITAL_COMMUNITY): Payer: Medicare Other

## 2024-01-12 ENCOUNTER — Ambulatory Visit (INDEPENDENT_AMBULATORY_CARE_PROVIDER_SITE_OTHER): Payer: Medicare Other | Admitting: *Deleted

## 2024-01-12 DIAGNOSIS — J309 Allergic rhinitis, unspecified: Secondary | ICD-10-CM

## 2024-01-17 ENCOUNTER — Ambulatory Visit (INDEPENDENT_AMBULATORY_CARE_PROVIDER_SITE_OTHER): Payer: Self-pay

## 2024-01-17 DIAGNOSIS — J309 Allergic rhinitis, unspecified: Secondary | ICD-10-CM

## 2024-01-23 DIAGNOSIS — J301 Allergic rhinitis due to pollen: Secondary | ICD-10-CM

## 2024-01-24 ENCOUNTER — Ambulatory Visit (INDEPENDENT_AMBULATORY_CARE_PROVIDER_SITE_OTHER): Payer: Self-pay

## 2024-01-24 DIAGNOSIS — J309 Allergic rhinitis, unspecified: Secondary | ICD-10-CM

## 2024-01-24 NOTE — Progress Notes (Signed)
 VIAL MADE 01-24-24 EXP 01-23-25

## 2024-01-25 ENCOUNTER — Ambulatory Visit (HOSPITAL_COMMUNITY): Payer: Medicare Other

## 2024-01-31 ENCOUNTER — Ambulatory Visit (INDEPENDENT_AMBULATORY_CARE_PROVIDER_SITE_OTHER): Payer: Self-pay

## 2024-01-31 DIAGNOSIS — J309 Allergic rhinitis, unspecified: Secondary | ICD-10-CM

## 2024-02-07 ENCOUNTER — Ambulatory Visit (INDEPENDENT_AMBULATORY_CARE_PROVIDER_SITE_OTHER): Payer: Self-pay

## 2024-02-07 DIAGNOSIS — J309 Allergic rhinitis, unspecified: Secondary | ICD-10-CM

## 2024-02-09 ENCOUNTER — Telehealth: Payer: Self-pay

## 2024-02-09 NOTE — Telephone Encounter (Signed)
 [employer] form has been signed by Dr.Gallagher. It has been faxed to [employer] at [phone]. Form has been placed to go to bulk scanning.

## 2024-02-14 ENCOUNTER — Ambulatory Visit (INDEPENDENT_AMBULATORY_CARE_PROVIDER_SITE_OTHER): Payer: Self-pay

## 2024-02-14 DIAGNOSIS — J309 Allergic rhinitis, unspecified: Secondary | ICD-10-CM

## 2024-02-19 DIAGNOSIS — H43393 Other vitreous opacities, bilateral: Secondary | ICD-10-CM | POA: Diagnosis not present

## 2024-02-21 ENCOUNTER — Ambulatory Visit (INDEPENDENT_AMBULATORY_CARE_PROVIDER_SITE_OTHER): Payer: Self-pay

## 2024-02-21 DIAGNOSIS — J309 Allergic rhinitis, unspecified: Secondary | ICD-10-CM | POA: Diagnosis not present

## 2024-02-23 DIAGNOSIS — E782 Mixed hyperlipidemia: Secondary | ICD-10-CM | POA: Diagnosis not present

## 2024-02-23 DIAGNOSIS — E1165 Type 2 diabetes mellitus with hyperglycemia: Secondary | ICD-10-CM | POA: Diagnosis not present

## 2024-02-28 ENCOUNTER — Ambulatory Visit (INDEPENDENT_AMBULATORY_CARE_PROVIDER_SITE_OTHER): Payer: Self-pay

## 2024-02-28 DIAGNOSIS — J309 Allergic rhinitis, unspecified: Secondary | ICD-10-CM

## 2024-02-29 ENCOUNTER — Other Ambulatory Visit: Payer: Self-pay | Admitting: Internal Medicine

## 2024-02-29 DIAGNOSIS — N289 Disorder of kidney and ureter, unspecified: Secondary | ICD-10-CM | POA: Diagnosis not present

## 2024-02-29 DIAGNOSIS — D509 Iron deficiency anemia, unspecified: Secondary | ICD-10-CM | POA: Diagnosis not present

## 2024-02-29 DIAGNOSIS — E1165 Type 2 diabetes mellitus with hyperglycemia: Secondary | ICD-10-CM | POA: Diagnosis not present

## 2024-02-29 DIAGNOSIS — E669 Obesity, unspecified: Secondary | ICD-10-CM | POA: Diagnosis not present

## 2024-02-29 DIAGNOSIS — E782 Mixed hyperlipidemia: Secondary | ICD-10-CM | POA: Diagnosis not present

## 2024-02-29 DIAGNOSIS — I10 Essential (primary) hypertension: Secondary | ICD-10-CM | POA: Diagnosis not present

## 2024-02-29 DIAGNOSIS — J45909 Unspecified asthma, uncomplicated: Secondary | ICD-10-CM | POA: Diagnosis not present

## 2024-02-29 DIAGNOSIS — R053 Chronic cough: Secondary | ICD-10-CM | POA: Diagnosis not present

## 2024-02-29 DIAGNOSIS — G4700 Insomnia, unspecified: Secondary | ICD-10-CM | POA: Diagnosis not present

## 2024-02-29 DIAGNOSIS — J4551 Severe persistent asthma with (acute) exacerbation: Secondary | ICD-10-CM | POA: Diagnosis not present

## 2024-02-29 DIAGNOSIS — R32 Unspecified urinary incontinence: Secondary | ICD-10-CM | POA: Diagnosis not present

## 2024-02-29 DIAGNOSIS — K219 Gastro-esophageal reflux disease without esophagitis: Secondary | ICD-10-CM | POA: Diagnosis not present

## 2024-03-06 ENCOUNTER — Other Ambulatory Visit: Payer: Self-pay | Admitting: Cardiology

## 2024-03-06 ENCOUNTER — Ambulatory Visit (INDEPENDENT_AMBULATORY_CARE_PROVIDER_SITE_OTHER): Payer: Self-pay

## 2024-03-06 DIAGNOSIS — J309 Allergic rhinitis, unspecified: Secondary | ICD-10-CM

## 2024-03-07 DIAGNOSIS — R059 Cough, unspecified: Secondary | ICD-10-CM | POA: Diagnosis not present

## 2024-03-07 DIAGNOSIS — J069 Acute upper respiratory infection, unspecified: Secondary | ICD-10-CM | POA: Diagnosis not present

## 2024-03-07 DIAGNOSIS — E782 Mixed hyperlipidemia: Secondary | ICD-10-CM | POA: Diagnosis not present

## 2024-03-08 ENCOUNTER — Telehealth: Payer: Self-pay | Admitting: Allergy & Immunology

## 2024-03-08 NOTE — Telephone Encounter (Signed)
 I received a nocturnal pulse ox report.  I do not recall ever ordering this.  It does show that her lowest pulse ox went down to 85%.  However, that was just 1 isolated episode.  Her average O2 was 94% throughout the night.  I do not think this qualifies for oxygen in my medical opinion.  We could certainly send her to pulmonology if she is still interested.  Malachi Bonds, MD [facility]

## 2024-03-08 NOTE — Telephone Encounter (Signed)
 Called and spoke to patient and informed her of the lab results. Patient expressed that she thinks that her PCP ordered these labs due to her being hospitalized in 2023. Patient requested that the study be sent to her PCP to discuss further. Patient also expressed that she would like to talk to Dr. Dellis Anes more in depth about her care at her next visit.  Sleepy study has been sent to her PCP per her request.

## 2024-03-11 ENCOUNTER — Encounter (HOSPITAL_COMMUNITY): Payer: Self-pay

## 2024-03-11 ENCOUNTER — Ambulatory Visit (HOSPITAL_COMMUNITY)
Admission: RE | Admit: 2024-03-11 | Discharge: 2024-03-11 | Disposition: A | Discharge status: Home / Self Care | Source: Ambulatory Visit | Attending: Internal Medicine | Admitting: Internal Medicine

## 2024-03-11 DIAGNOSIS — Z1231 Encounter for screening mammogram for malignant neoplasm of breast: Secondary | ICD-10-CM | POA: Insufficient documentation

## 2024-03-13 ENCOUNTER — Ambulatory Visit (INDEPENDENT_AMBULATORY_CARE_PROVIDER_SITE_OTHER): Payer: Self-pay

## 2024-03-13 DIAGNOSIS — J309 Allergic rhinitis, unspecified: Secondary | ICD-10-CM

## 2024-03-20 ENCOUNTER — Ambulatory Visit (INDEPENDENT_AMBULATORY_CARE_PROVIDER_SITE_OTHER): Payer: Self-pay

## 2024-03-20 DIAGNOSIS — J309 Allergic rhinitis, unspecified: Secondary | ICD-10-CM | POA: Diagnosis not present

## 2024-04-05 ENCOUNTER — Encounter: Payer: Self-pay | Admitting: Allergy & Immunology

## 2024-04-05 ENCOUNTER — Other Ambulatory Visit: Payer: Self-pay | Admitting: Allergy & Immunology

## 2024-04-05 ENCOUNTER — Ambulatory Visit: Payer: Self-pay

## 2024-04-05 ENCOUNTER — Ambulatory Visit (INDEPENDENT_AMBULATORY_CARE_PROVIDER_SITE_OTHER): Payer: Medicare Other | Admitting: Allergy & Immunology

## 2024-04-05 ENCOUNTER — Other Ambulatory Visit: Payer: Self-pay

## 2024-04-05 VITALS — BP 130/70 | HR 85 | Temp 97.5°F | Ht 61.42 in | Wt 184.8 lb

## 2024-04-05 DIAGNOSIS — J309 Allergic rhinitis, unspecified: Secondary | ICD-10-CM | POA: Diagnosis not present

## 2024-04-05 DIAGNOSIS — K219 Gastro-esophageal reflux disease without esophagitis: Secondary | ICD-10-CM | POA: Diagnosis not present

## 2024-04-05 DIAGNOSIS — H1010 Acute atopic conjunctivitis, unspecified eye: Secondary | ICD-10-CM

## 2024-04-05 DIAGNOSIS — H1013 Acute atopic conjunctivitis, bilateral: Secondary | ICD-10-CM | POA: Diagnosis not present

## 2024-04-05 DIAGNOSIS — J4540 Moderate persistent asthma, uncomplicated: Secondary | ICD-10-CM | POA: Diagnosis not present

## 2024-04-05 MED ORDER — IPRATROPIUM BROMIDE 0.03 % NA SOLN: 2.0000 | Freq: Three times a day (TID) | NASAL | 5 refills | Status: AC | PRN

## 2024-04-05 MED ORDER — EPINEPHRINE 0.3 MG/0.3ML IJ SOAJ: 0.3000 mg | INTRAMUSCULAR | 1 refills | Status: AC | PRN

## 2024-04-05 MED ORDER — DULERA 100-5 MCG/ACT IN AERO: 2.0000 | INHALATION_SPRAY | Freq: Two times a day (BID) | RESPIRATORY_TRACT | 5 refills | Status: AC

## 2024-04-05 MED ORDER — FAMOTIDINE 40 MG PO TABS: 40.0000 mg | ORAL_TABLET | Freq: Two times a day (BID) | ORAL | 1 refills | Status: AC

## 2024-04-05 MED ORDER — LEVALBUTEROL TARTRATE 45 MCG/ACT IN AERO: 2.0000 | INHALATION_SPRAY | RESPIRATORY_TRACT | 1 refills | Status: DC | PRN

## 2024-04-05 MED ORDER — SPACER/AERO-HOLDING CHAMBERS DEVI: 1.0000 | 1 refills | Status: AC

## 2024-04-05 NOTE — Progress Notes (Signed)
 FOLLOW UP  Date of Service/Encounter:  04/05/24   Assessment:   Chronic cough - with some improvement on Dulera  (followed with Dr. Waymond Hailey in the past)   Mixed rhinitis (trees) - on allergen immunotherapy with moderate improvement in symptoms   Gastroesophageal reflux disease - on famotidine  and pantoprazole    Failed Breztri in the past   Adverse reaction to PPIs (joint plan)    Plan/Recommendations:   1. Moderate persistent asthma, uncomplicated - Lung testing looks great today. - We are not going to make any changes since you seem to be doing better.  - We are not going to add on the Spiriva since you did not seem to think that it made a difference.  - I am fine with re-filling your Tessalon  pearls. - Daily controller medication(s): Dulera  100/35mcg two puffs twice daily with spacer - Prior to physical activity: Xopenex  2 puffs 10-15 minutes before physical activity. - Rescue medications: Xopenex  4 puffs every 4-6 hours as needed - Asthma control goals:  * Full participation in all desired activities (may need albuterol  before activity) * Albuterol  use two time or less a week on average (not counting use with activity) * Cough interfering with sleep two time or less a month * Oral steroids no more than once a year * No hospitalizations  2. Mixed rhinitis - with sensitization to trees - Previous testing showed: trees - Continue with allergy  shots at the same schedule (they seem to be working). - I would stop the fluticasone  nasal spray and focus on the ipratropium nasal spray instead up to three times daily. - Continue taking: Chlortab per Dr. Waymond Hailey - Ok to take cetirizine 10mg  TWICE daily during the worse times of the year.  - You can use an extra dose of the antihistamine, if needed, for breakthrough symptoms.  - Consider nasal saline rinses 1-2 times daily to remove allergens from the nasal cavities as well as help with mucous clearance (this is especially helpful to do  before the nasal sprays are given)  3. Gastroesophageal reflux disease - Continue with Pepcid  (famotidine ) 40mg  twice daily.   4. Return in about 6 months (around 10/06/2024). You can have the follow up appointment with Dr. Idolina Maker or a Nurse Practicioner (our Nurse Practitioners are excellent and always have Physician oversight!).   Subjective:   Tammy [name] is a 78 y.o. 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  Discussed the use of AI scribe software for clinical note transcription with  the patient and/or guardian, who gave verbal consent to proceed.  Tammy [name] is a 78 y.o. female presenting for a follow up visit.  She was last seen in February 2025.  At that time, she continued to have a cough although it was slightly better.  She was needing her emergency inhaler fairly frequently, so we added on Spiriva 1.25 mcg 2 puffs once daily.  We also refilled her Tessalon  Perles.  She  continued on Dulera  100 mcg 2 puffs twice daily and Spiriva 1.25 mcg 2 puffs once daily.  For her rhinitis, we continued her on Chlortab as well as Flonase  and Atrovent .  For her GERD, we continue with 40 mg twice daily.  She remained on allergy  shots for her trees.  Since the last visit, she has done well.  Asthma/Respiratory Symptom History: Her chronic cough has improved but persists with some associated pain. She experiences difficulty expectorating mucus, but feels better once she does. The frequency of the cough has decreased compared to previous visits. She uses Tessalon  Perles as needed and finds them effective. She was previously on Spiriva but discontinued it due to lack of efficacy. She uses an albuterol  inhaler without significant relief and has stopped using a nebulizer.  Allergic Rhinitis Symptom History: For nasal symptoms, she uses Flonase  and Atrovent  nasal sprays every morning, sometimes midday, and at night, which help with drying her nose and reducing mucus production. She takes Chlortab over the counter and finds it helpful. She is considering switching from Zyrtec to another antihistamine like Xyzal or Allegra due to perceived tolerance. She receives allergy  shots and has recently started a two-week interval schedule, noting improvement in symptoms, especially after missing a dose last year. Her family also noticed a difference when she resumed the shots. She does better when not spending much time outdoors, as outdoor exposure seems to exacerbate her symptoms.    Tammy [name] is on allergen immunotherapy. She receives one injection. Immunotherapy script #1 contains trees. She currently receives 0.30mL of the RED vial (1/100). She started shots May of 2024 and reached maintenance in December of 2024.  She has not had any large local reactions.  Otherwise, there have been no changes to her past medical history, surgical history, family history, or social history.    Review of systems  otherwise negative other than that mentioned in the HPI.    Objective:   Blood pressure 130/70, pulse 85, temperature (!) 97.5 F (36.4 C), temperature source Temporal, height 5' 1.42" (1.56 m), weight 184 lb 12.8 oz (83.8 kg), SpO2 98%. Body mass index is 34.45 kg/m.    Physical Exam Vitals reviewed.  Constitutional:      Appearance: She is well-developed.     Comments: Hoarseness.  Pleasant.  HENT:     Head: Normocephalic and atraumatic.     Right Ear: Tympanic membrane, ear canal and external ear normal. No drainage, swelling or tenderness. Tympanic membrane is not injected, scarred, erythematous, retracted or bulging.     Left Ear: Tympanic membrane, ear canal and external ear normal. No drainage, swelling or tenderness. Tympanic membrane is not injected, scarred, erythematous, retracted or bulging.     Nose: Mucosal edema present. No nasal deformity or septal deviation.     Right Turbinates: Enlarged, swollen and pale.     Left Turbinates: Enlarged, swollen and pale.     Right Sinus: No maxillary sinus tenderness or frontal sinus tenderness.     Left Sinus: No maxillary sinus tenderness or frontal sinus tenderness.  Comments: No polyps.     Mouth/Throat:     Mouth: Mucous membranes are not pale and not dry.     Pharynx: Uvula midline.  Eyes:     General:        Right eye: No discharge.        Left eye: No discharge.     Conjunctiva/sclera: Conjunctivae normal.     Right eye: Right conjunctiva is not injected. No chemosis.    Left eye: Left conjunctiva is not injected. No chemosis.    Pupils: Pupils are equal, round, and reactive to light.  Cardiovascular:     Rate and Rhythm: Normal rate and regular rhythm.     Heart sounds: Normal heart sounds.  Pulmonary:     Effort: Pulmonary effort is normal. No tachypnea, accessory muscle usage or respiratory distress.     Breath sounds: Normal breath sounds. No transmitted upper airway sounds. No wheezing, rhonchi or rales.   Chest:     Chest wall: No tenderness.  Abdominal:     Tenderness: There is no abdominal tenderness. There is no guarding or rebound.  Lymphadenopathy:     Head:     Right side of head: No submandibular, tonsillar or occipital adenopathy.     Left side of head: No submandibular, tonsillar or occipital adenopathy.     Cervical: No cervical adenopathy.  Skin:    Coloration: Skin is not pale.     Findings: No abrasion, erythema, petechiae or rash. Rash is not papular, urticarial or vesicular.  Neurological:     Mental Status: She is alert.  Psychiatric:        Behavior: Behavior is cooperative.      Diagnostic studies:    Spirometry: results normal (FEV1: 1.59/106%, FVC: 2.17/111%, FEV1/FVC: 73%).    Spirometry consistent with normal pattern.   Allergy  Studies: none        Drexel Gentles, MD  [facility] 

## 2024-04-05 NOTE — Patient Instructions (Addendum)
 1. Moderate persistent asthma, uncomplicated - Lung testing looks great today. - We are not going to make any changes since you seem to be doing better.  - We are not going to add on the Spiriva since you did not seem to think that it made a difference.  - I am fine with re-filling your Tessalon  pearls. - Daily controller medication(s): Dulera  100/79mcg two puffs twice daily with spacer - Prior to physical activity: Xopenex  2 puffs 10-15 minutes before physical activity. - Rescue medications: Xopenex  4 puffs every 4-6 hours as needed - Asthma control goals:  * Full participation in all desired activities (may need albuterol  before activity) * Albuterol  use two time or less a week on average (not counting use with activity) * Cough interfering with sleep two time or less a month * Oral steroids no more than once a year * No hospitalizations  2. Mixed rhinitis - with sensitization to trees - Previous testing showed: trees - Continue with allergy  shots at the same schedule (they seem to be working). - I would stop the fluticasone  nasal spray and focus on the ipratropium nasal spray instead up to three times daily. - Continue taking: Chlortab per Dr. Waymond Hailey - Ok to take cetirizine 10mg  TWICE daily during the worse times of the year.  - You can use an extra dose of the antihistamine, if needed, for breakthrough symptoms.  - Consider nasal saline rinses 1-2 times daily to remove allergens from the nasal cavities as well as help with mucous clearance (this is especially helpful to do before the nasal sprays are given)  3. Gastroesophageal reflux disease - Continue with Pepcid  (famotidine ) 40mg  twice daily.   4. Return in about 6 months (around 10/06/2024). You can have the follow up appointment with Dr. Idolina Maker or a Nurse Practicioner (our Nurse Practitioners are excellent and always have Physician oversight!).    Please inform us  of any Emergency Department visits, hospitalizations, or changes  in symptoms. Call us  before going to the ED for breathing or allergy  symptoms since we might be able to fit you in for a sick visit. Feel free to contact us  anytime with any questions, problems, or concerns.  It was a pleasure to see you again today!  Websites that have reliable patient information: 1. American Academy of Asthma, Allergy , and Immunology: www.aaaai.org 2. Food Allergy  Research and Education (FARE): foodallergy.org 3. Mothers of Asthmatics: http://www.asthmacommunitynetwork.org 4. American College of Allergy , Asthma, and Immunology: www.acaai.org      "Like" us  on Facebook and Instagram for our latest updates!      A healthy democracy works best when [employer] participate! Make sure you are registered to vote! If you have moved or changed any of your contact information, you will need to get this updated before voting! Scan the QR codes below to learn more!

## 2024-04-08 NOTE — Addendum Note (Signed)
 Addended by: Mollie Anger on: 04/08/2024 05:09 PM   Modules accepted: Orders

## 2024-04-10 NOTE — Progress Notes (Signed)
 MAKE VIAL WHEN NEEDED.

## 2024-04-10 NOTE — Progress Notes (Signed)
 Aeroallergen Immunotherapy  Ordering Provider: Dr. Drexel Gentles  Patient Details Name: Tammy [name] MRN: [id number] Date of Birth: July 15, 1946  Order 1 of 1  Order 1 of 1  Vial Label: Trees  0.7 ml (Volume)  1:20 Concentration -- Eastern 10 Tree Mix (also Sweet Gum) 0.2 ml (Volume)  1:20 Concentration -- Maple Mix* 0.2 ml (Volume)  1:10 Concentration -- Pecan Pollen   1.1  ml Extract Subtotal 3.9  ml Diluent  5.0  ml Maintenance Total   Schedule:  C  Red Vial (1:100): Schedule C (5 doses)  Special Instructions: Start the first vial on Schedule C. Then advance subsequent vials at 0.45mL --> 0.3 mL --> 0.5 mL.

## 2024-04-13 ENCOUNTER — Other Ambulatory Visit: Payer: Self-pay | Admitting: Internal Medicine

## 2024-04-16 ENCOUNTER — Other Ambulatory Visit: Payer: Self-pay | Admitting: Internal Medicine

## 2024-04-17 ENCOUNTER — Ambulatory Visit (INDEPENDENT_AMBULATORY_CARE_PROVIDER_SITE_OTHER)

## 2024-04-17 DIAGNOSIS — J309 Allergic rhinitis, unspecified: Secondary | ICD-10-CM | POA: Diagnosis not present

## 2024-05-01 ENCOUNTER — Ambulatory Visit (INDEPENDENT_AMBULATORY_CARE_PROVIDER_SITE_OTHER): Payer: Self-pay

## 2024-05-01 DIAGNOSIS — J309 Allergic rhinitis, unspecified: Secondary | ICD-10-CM

## 2024-05-15 ENCOUNTER — Ambulatory Visit (INDEPENDENT_AMBULATORY_CARE_PROVIDER_SITE_OTHER): Payer: Self-pay

## 2024-05-15 DIAGNOSIS — J309 Allergic rhinitis, unspecified: Secondary | ICD-10-CM | POA: Diagnosis not present

## 2024-05-29 ENCOUNTER — Ambulatory Visit (INDEPENDENT_AMBULATORY_CARE_PROVIDER_SITE_OTHER): Payer: Self-pay

## 2024-05-29 DIAGNOSIS — J309 Allergic rhinitis, unspecified: Secondary | ICD-10-CM | POA: Diagnosis not present

## 2024-06-12 ENCOUNTER — Ambulatory Visit (INDEPENDENT_AMBULATORY_CARE_PROVIDER_SITE_OTHER): Payer: Self-pay

## 2024-06-12 DIAGNOSIS — J309 Allergic rhinitis, unspecified: Secondary | ICD-10-CM

## 2024-06-13 DIAGNOSIS — J301 Allergic rhinitis due to pollen: Secondary | ICD-10-CM | POA: Diagnosis not present

## 2024-06-13 NOTE — Progress Notes (Signed)
 VIAL MADE 06-13-24

## 2024-06-14 ENCOUNTER — Ambulatory Visit: Admitting: Internal Medicine

## 2024-06-19 DIAGNOSIS — B9689 Other specified bacterial agents as the cause of diseases classified elsewhere: Secondary | ICD-10-CM | POA: Diagnosis not present

## 2024-06-19 DIAGNOSIS — Z79891 Long term (current) use of opiate analgesic: Secondary | ICD-10-CM | POA: Diagnosis not present

## 2024-06-19 DIAGNOSIS — J4551 Severe persistent asthma with (acute) exacerbation: Secondary | ICD-10-CM | POA: Diagnosis not present

## 2024-06-19 DIAGNOSIS — R0600 Dyspnea, unspecified: Secondary | ICD-10-CM | POA: Diagnosis not present

## 2024-06-19 DIAGNOSIS — R059 Cough, unspecified: Secondary | ICD-10-CM | POA: Diagnosis not present

## 2024-06-19 DIAGNOSIS — J019 Acute sinusitis, unspecified: Secondary | ICD-10-CM | POA: Diagnosis not present

## 2024-06-26 ENCOUNTER — Ambulatory Visit (INDEPENDENT_AMBULATORY_CARE_PROVIDER_SITE_OTHER)

## 2024-06-26 DIAGNOSIS — J309 Allergic rhinitis, unspecified: Secondary | ICD-10-CM

## 2024-07-18 DIAGNOSIS — Z79899 Other long term (current) drug therapy: Secondary | ICD-10-CM | POA: Diagnosis not present

## 2024-07-18 DIAGNOSIS — Z713 Dietary counseling and surveillance: Secondary | ICD-10-CM | POA: Diagnosis not present

## 2024-07-18 DIAGNOSIS — Z681 Body mass index (BMI) 19 or less, adult: Secondary | ICD-10-CM | POA: Diagnosis not present

## 2024-07-18 DIAGNOSIS — I10 Essential (primary) hypertension: Secondary | ICD-10-CM | POA: Diagnosis not present

## 2024-07-24 ENCOUNTER — Ambulatory Visit (INDEPENDENT_AMBULATORY_CARE_PROVIDER_SITE_OTHER)

## 2024-07-24 DIAGNOSIS — J309 Allergic rhinitis, unspecified: Secondary | ICD-10-CM | POA: Diagnosis not present

## 2024-07-31 ENCOUNTER — Ambulatory Visit (INDEPENDENT_AMBULATORY_CARE_PROVIDER_SITE_OTHER): Payer: Self-pay

## 2024-07-31 DIAGNOSIS — J309 Allergic rhinitis, unspecified: Secondary | ICD-10-CM

## 2024-08-01 DIAGNOSIS — R059 Cough, unspecified: Secondary | ICD-10-CM | POA: Diagnosis not present

## 2024-08-01 DIAGNOSIS — Z713 Dietary counseling and surveillance: Secondary | ICD-10-CM | POA: Diagnosis not present

## 2024-08-01 DIAGNOSIS — G4719 Other hypersomnia: Secondary | ICD-10-CM | POA: Diagnosis not present

## 2024-08-01 DIAGNOSIS — I10 Essential (primary) hypertension: Secondary | ICD-10-CM | POA: Diagnosis not present

## 2024-08-01 DIAGNOSIS — Z79899 Other long term (current) drug therapy: Secondary | ICD-10-CM | POA: Diagnosis not present

## 2024-08-06 DIAGNOSIS — J4541 Moderate persistent asthma with (acute) exacerbation: Secondary | ICD-10-CM | POA: Diagnosis not present

## 2024-08-09 ENCOUNTER — Other Ambulatory Visit: Payer: Self-pay

## 2024-08-09 ENCOUNTER — Encounter: Payer: Self-pay | Admitting: Emergency Medicine

## 2024-08-09 ENCOUNTER — Ambulatory Visit (INDEPENDENT_AMBULATORY_CARE_PROVIDER_SITE_OTHER)

## 2024-08-09 ENCOUNTER — Ambulatory Visit: Admission: EM | Admit: 2024-08-09 | Discharge: 2024-08-09 | Disposition: A | Discharge status: Home / Self Care

## 2024-08-09 ENCOUNTER — Other Ambulatory Visit (HOSPITAL_COMMUNITY): Payer: Self-pay

## 2024-08-09 DIAGNOSIS — M129 Arthropathy, unspecified: Secondary | ICD-10-CM | POA: Diagnosis not present

## 2024-08-09 DIAGNOSIS — M79644 Pain in right finger(s): Secondary | ICD-10-CM

## 2024-08-09 DIAGNOSIS — R55 Syncope and collapse: Secondary | ICD-10-CM

## 2024-08-09 DIAGNOSIS — S62521A Displaced fracture of distal phalanx of right thumb, initial encounter for closed fracture: Secondary | ICD-10-CM | POA: Diagnosis not present

## 2024-08-09 DIAGNOSIS — S62636A Displaced fracture of distal phalanx of right little finger, initial encounter for closed fracture: Secondary | ICD-10-CM | POA: Diagnosis not present

## 2024-08-09 NOTE — Discharge Instructions (Signed)
 The x-ray of the right thumb shows that you do have a fracture. Your lab results are pending.  You will be contacted if the pending test results are abnormal.  You also have access to your results via MyChart. You may take over-the-counter Tylenol  as needed for pain or discomfort. RICE therapy, rest, ice, compression, and elevation.  A splint has been provided for you to wear on your right thumb to allow for additional compression and support.  Wear the splint until you have been seen by orthopedics.  Apply ice for 20 minutes, remove for 1 hour, repeat as needed. Make sure you are drinking plenty of fluids and allowing for plenty of rest. Avoid sudden movement to prevent further fall. As discussed, I would like for you to follow-up with cardiology as soon as possible for reevaluation. I would also like for you to follow-up with orthopedics for the fracture of your thumb.   Go to the emergency department if you experience further loss of consciousness, chest pain, shortness of breath, or difficulty breathing. Follow-up as needed.

## 2024-08-09 NOTE — ED Provider Notes (Signed)
 [facility]    CSN: [id number] Arrival date & time: 08/09/24  1004      History   Chief Complaint Chief Complaint  Patient presents with   Loss of Consciousness    HPI Tammy [name] is a 78 y.o. female.   The history is provided by the patient.   Patient presents for complaints of right thumb pain, and right knee pain after she experienced a syncopal episode 1 day ago.  Patient states that she was completing a breathing treatment and the next thing she knows, she was in the floor.  She states that she did hit the side of her head, and also has bruising to the right thumb.  She also states that she has pain behind the right knee.  Today she states I feel fine.  She denies dizziness, headache, light sensitivity, sound sensitivity, deformity of the right knee, swelling, decreased range of motion, or the inability to ambulate.  Patient with underlying history of PAT and PVCs.  She states that she does see cardiology.  Patient is unsure of how long she was out, but states that she got up and continued her normal activities.  Past Medical History:  Diagnosis Date   Asthma    Coronary artery calcification seen on CT scan    Hyperlipidemia    Hypertension    Mild pulmonary hypertension (HCC)    PAT (paroxysmal atrial tachycardia) (HCC)    PVC's (premature ventricular contractions)    Sinus pause    Type 2 MI (myocardial infarction) [facility])     Patient Active Problem List   Diagnosis Date Noted   Not well controlled moderate persistent asthma 10/25/2023   Acute non-recurrent maxillary sinusitis 10/25/2023   Seasonal allergic rhinitis due to pollen 05/19/2023   Mixed rhinitis 03/15/2023   Mild intermittent asthma without complication 03/15/2023   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   Chronic cough 12/08/2022   Gastroesophageal reflux disease 12/08/2022   Coronary artery calcification 07/25/2022   Upper airway cough syndrome 07/12/2022   Acute respiratory failure with  hypoxia (HCC) 06/01/2022   Essential hypertension 06/01/2022   Pulmonary Embolus RULED OUT 06/01/2022   Leukocytosis 06/01/2022   Hyperlipidemia 06/01/2022   Severe asthma with exacerbation 06/01/2022   Elevated troponin 06/01/2022   S/P right rotator cuff repair 10/06/2016   Complete tear of left rotator cuff     Past Surgical History:  Procedure Laterality Date   ABDOMINAL HYSTERECTOMY  1988   CATARACT EXTRACTION W/PHACO Right 09/27/2019   Procedure: CATARACT EXTRACTION PHACO AND INTRAOCULAR LENS PLACEMENT (IOC) (CDE: 4.94);  Surgeon: Harrie Agent, MD;  Location: [facility];  Service: Ophthalmology;  Laterality: Right;   CATARACT EXTRACTION W/PHACO Left 10/18/2019   Procedure: CATARACT EXTRACTION PHACO AND INTRAOCULAR LENS PLACEMENT (IOC);  Surgeon: Harrie Agent, MD;  Location: [facility];  Service: Ophthalmology;  Laterality: Left;  CDE: 7.18   COLONOSCOPY N/A 08/11/2016   Procedure: COLONOSCOPY;  Surgeon: Claudis RAYMOND Rivet, MD;  [facility]: [facility];  Service: Endoscopy;  Laterality: N/A;  730   ELBOW FRACTURE SURGERY     ESOPHAGOGASTRODUODENOSCOPY (EGD) WITH PROPOFOL  N/A 12/30/2022   Procedure: ESOPHAGOGASTRODUODENOSCOPY (EGD) WITH PROPOFOL ;  Surgeon: Eartha Angelia Sieving, MD;  [facility]: [facility];  Service: Gastroenterology;  Laterality: N/A;  10:45 am   KNEE ARTHROSCOPY     POLYPECTOMY  12/30/2022   Procedure: POLYPECTOMY;  Surgeon: Eartha Angelia Sieving, MD;  [facility]: [facility];  Service: Gastroenterology;;   SHOULDER OPEN ROTATOR CUFF REPAIR Right 10/06/2016  Procedure: OPEN ROTATOR CUFF REPAIR RIGHT SHOULDER;  Surgeon: Taft FORBES Minerva, MD;  Location: [facility];  Service: Orthopedics;  Laterality: Right;    OB History   No obstetric history on file.      Home Medications    Prior to Admission medications   Medication Sig Start Date End Date Taking? Authorizing Provider  amLODipine (NORVASC) 5 MG tablet  07/18/24  Yes [provider]  predniSONE   (STERAPRED UNI-PAK 21 TAB) 10 MG (21) TBPK tablet Take by mouth as directed. 03/07/24  Yes [provider]  valsartan -hydrochlorothiazide  (DIOVAN -HCT) 160-25 MG tablet  04/17/24  Yes [provider]  Accu-Chek Softclix Lancets lancets daily. 09/05/23   [provider]  albuterol  (VENTOLIN  HFA) 108 (90 Base) MCG/ACT inhaler SMARTSIG:2 inhalation By Mouth Every 4 Hours PRN 11/19/23   [provider]  albuterol  (VENTOLIN  HFA) 108 (90 Base) MCG/ACT inhaler Inhale 2 puffs into the lungs every 4 (four) hours as needed for wheezing or shortness of breath. 04/10/24   Iva Marty Saltness, MD  aspirin  EC 81 MG tablet Take 1 tablet (81 mg total) by mouth daily. Swallow whole. 01/18/23   Dunn, Dayna N, PA-C  chlorpheniramine (CHLOR-TRIMETON) 4 MG tablet Take 4 mg by mouth every 4 (four) hours as needed for allergies.    [provider]  EPINEPHrine  0.3 mg/0.3 mL IJ SOAJ injection Inject 0.3 mg into the muscle as needed for anaphylaxis. 04/05/24   Iva Marty Saltness, MD  famotidine  (PEPCID ) 40 MG tablet Take 1 tablet (40 mg total) by mouth 2 (two) times daily. 04/05/24   Iva Marty Saltness, MD  ipratropium (ATROVENT ) 0.03 % nasal spray Place 2 sprays into both nostrils 3 (three) times daily as needed for rhinitis. 04/05/24   Iva Marty Saltness, MD  mometasone-formoterol (DULERA ) 100-5 MCG/ACT AERO Inhale 2 puffs into the lungs 2 (two) times daily. 04/05/24   Iva Marty Saltness, MD  rosuvastatin  (CRESTOR ) 5 MG tablet TAKE 1 TABLET (5 MG TOTAL) BY MOUTH DAILY. 03/07/24   Johnson Laymon HERO, PA-C  Spacer/Aero-Holding Chambers DEVI 1 Device by Does not apply route as directed. 04/05/24   Iva Marty Saltness, MD    Family History Family History  Problem Relation Age of Onset   Eczema Father    Asthma Father    Cancer Maternal Grandmother     Social History Social History   Tobacco Use   Smoking status: Former    Current packs/day: 0.00    Average packs/day:  0.8 packs/day for 15.0 years (11.3 ttl pk-yrs)    Types: Cigarettes    Start date: 11/27/1968    Quit date: 11/28/1983    Years since quitting: 40.7   Smokeless tobacco: Never  Vaping Use   Vaping status: Never Used  Substance Use Topics   Alcohol use: No   Drug use: No     Allergies   Patient has no known allergies.   Review of Systems Review of Systems  Cardiovascular:  Positive for syncope.     Physical Exam Triage Vital Signs ED Triage Vitals  Encounter Vitals Group     BP 08/09/24 1041 136/81     Girls Systolic BP Percentile --      Girls Diastolic BP Percentile --      Boys Systolic BP Percentile --      Boys Diastolic BP Percentile --      Pulse Rate 08/09/24 1041 76     Resp 08/09/24 1041 20     Temp 08/09/24  1041 98.2 F (36.8 C)     Temp Source 08/09/24 1041 Oral     SpO2 08/09/24 1041 93 %     Weight --      Height --      Head Circumference --      Peak Flow --      Pain Score 08/09/24 1034 8     Pain Loc --      Pain Education --      Exclude from Growth Chart --    Orthostatic VS for the past 24 hrs:  BP- Lying Pulse- Lying BP- Sitting Pulse- Sitting BP- Standing at 0 minutes Pulse- Standing at 0 minutes  08/09/24 1138 (!) 136/95 82 132/83 87 139/83 88    Updated Vital Signs BP 136/81 (BP Location: Left Arm)   Pulse 76   Temp 98.2 F (36.8 C) (Oral)   Resp 20   SpO2 93%   Visual Acuity Right Eye Distance:   Left Eye Distance:   Bilateral Distance:    Right Eye Near:   Left Eye Near:    Bilateral Near:     Physical Exam Vitals and nursing note reviewed.  Constitutional:      General: She is not in acute distress.    Appearance: Normal appearance.  HENT:     Head: Normocephalic.     Right Ear: Tympanic membrane, ear canal and external ear normal.     Left Ear: Tympanic membrane, ear canal and external ear normal.     Nose: Nose normal.     Mouth/Throat:     Mouth: Mucous membranes are moist.  Eyes:     Extraocular  Movements: Extraocular movements intact.     Conjunctiva/sclera: Conjunctivae normal.     Pupils: Pupils are equal, round, and reactive to light.  Cardiovascular:     Rate and Rhythm: Normal rate and regular rhythm.     Pulses: Normal pulses.     Heart sounds: Normal heart sounds.  Pulmonary:     Effort: Pulmonary effort is normal. No respiratory distress.     Breath sounds: Normal breath sounds. No stridor. No wheezing, rhonchi or rales.  Abdominal:     General: Bowel sounds are normal.     Palpations: Abdomen is soft.     Tenderness: There is no abdominal tenderness.  Musculoskeletal:     Cervical back: Normal range of motion.  Skin:    General: Skin is warm and dry.     Findings: Abrasion present.      Neurological:     General: No focal deficit present.     Mental Status: She is alert and oriented to person, place, and time.     GCS: GCS eye subscore is 4. GCS verbal subscore is 5. GCS motor subscore is 6.     Cranial Nerves: Cranial nerves 2-12 are intact.     Sensory: Sensation is intact.     Motor: Motor function is intact.     Coordination: Coordination is intact.     Gait: Gait is intact.  Psychiatric:        Mood and Affect: Mood normal.        Behavior: Behavior normal.      UC Treatments / Results  Labs (all labs ordered are listed, but only abnormal results are displayed) Labs Reviewed  CBC WITH DIFFERENTIAL/PLATELET  BASIC METABOLIC PANEL WITH GFR    EKG: Sinus rhythm with PACs, no STEMI.  Compared to EKGs dated 01/18/2023, 06/02/2022, and  06/01/2022   Radiology DG Finger Thumb Right Result Date: 08/09/2024 CLINICAL DATA:  Bruising and swelling of the right thumb after fall. EXAM: RIGHT THUMB 2+V COMPARISON:  None Available. FINDINGS: Acute transverse oblique fracture of the mid fifth distal phalanx with approximately 1 mm of palmar displacement of the distal fracture component. No definite evidence of intra-articular extension. No dislocation. Severe  degenerative arthropathy of the first [facility] joint. No radiopaque foreign body. IMPRESSION: Acute minimally displaced fracture of the mid distal phalanx of the thumb. Electronically Signed   By: Harrietta Sherry M.D.   On: 08/09/2024 12:02    Procedures Procedures (including critical care time)  Medications Ordered in UC Medications - No data to display  Initial Impression / Assessment and Plan / UC Course  I have reviewed the triage vital signs and the nursing notes.  Pertinent labs & imaging results that were available during my care of the patient were reviewed by me and considered in my medical decision making (see chart for details).  The patient is well-appearing, she is in no acute distress, vital signs are stable.  EKG shows sinus rhythm with PACs, patient with history of PVCs per review of her chart.  The patient is currently not on any blood thinning medication.  She is alert and oriented x 4.  Orthostatic vital signs were negative, difficult to determine cause of syncopal episode at this time.  Will collect CBC and BMP for safety.  X-ray of the right thumb shows a minimally displaced fracture of the mid distal phalanx.  Finger splint was provided.  Supportive care recommendations were provided discussed with the patient to include fluids, rest, over-the-counter analgesics, and RICE therapy.  Will have patient follow-up with orthopedics within the next 3 to 5 days for reevaluation.  Will also have patient follow-up with cardiology for further evaluation of the syncopal episode.  Patient was given strict ER follow-up precautions.  Patient was in agreement with this plan of care and verbalizes understanding.  All questions were answered.  Patient stable for discharge.  Final Clinical Impressions(s) / UC Diagnoses   Final diagnoses:  Thumb pain, right  Syncope, unspecified syncope type  Closed displaced fracture of distal phalanx of right thumb, initial encounter     Discharge  Instructions      The x-ray of the right thumb shows that you do have a fracture. Your lab results are pending.  You will be contacted if the pending test results are abnormal.  You also have access to your results via MyChart. You may take over-the-counter Tylenol  as needed for pain or discomfort. RICE therapy, rest, ice, compression, and elevation.  A splint has been provided for you to wear on your right thumb to allow for additional compression and support.  Wear the splint until you have been seen by orthopedics.  Apply ice for 20 minutes, remove for 1 hour, repeat as needed. Make sure you are drinking plenty of fluids and allowing for plenty of rest. Avoid sudden movement to prevent further fall. As discussed, I would like for you to follow-up with cardiology as soon as possible for reevaluation. I would also like for you to follow-up with orthopedics for the fracture of your thumb.   Go to the emergency department if you experience further loss of consciousness, chest pain, shortness of breath, or difficulty breathing. Follow-up as needed.      ED Prescriptions   None    PDMP not reviewed this encounter.   Leath-Warren, Etta PARAS,  NP 08/09/24 1221

## 2024-08-09 NOTE — ED Notes (Signed)
 Multiple EKGs were printed due to pt have ectopy during EKG. NP at bedside while EKG being performed.

## 2024-08-09 NOTE — ED Triage Notes (Addendum)
 Pt reports was completing breathing treatment last night while sitting on side of bed and reports I felt funny and the next thing pt knows woke up on the floor.  Pt reports right thump pain/discoloration and right lower leg pain ever since. Denies being on blood thinner, nausea, headache. Pt alert and oriented. Gait steady.

## 2024-08-10 LAB — CBC WITH DIFFERENTIAL/PLATELET
Basophils Absolute: 0.1 x10E3/uL (ref 0.0–0.2)
Basos: 0 %
EOS (ABSOLUTE): 0 x10E3/uL (ref 0.0–0.4)
Eos: 0 %
Hematocrit: 46.4 % (ref 34.0–46.6)
Hemoglobin: 14.6 g/dL (ref 11.1–15.9)
Immature Grans (Abs): 0 x10E3/uL (ref 0.0–0.1)
Immature Granulocytes: 0 %
Lymphocytes Absolute: 2.9 x10E3/uL (ref 0.7–3.1)
Lymphs: 25 %
MCH: 25.8 pg — ABNORMAL LOW (ref 26.6–33.0)
MCHC: 31.5 g/dL (ref 31.5–35.7)
MCV: 82 fL (ref 79–97)
Monocytes Absolute: 0.8 x10E3/uL (ref 0.1–0.9)
Monocytes: 7 %
Neutrophils Absolute: 7.7 x10E3/uL — ABNORMAL HIGH (ref 1.4–7.0)
Neutrophils: 68 %
Platelets: 354 x10E3/uL (ref 150–450)
RBC: 5.66 x10E6/uL — ABNORMAL HIGH (ref 3.77–5.28)
RDW: 13.8 % (ref 11.7–15.4)
WBC: 11.4 x10E3/uL — ABNORMAL HIGH (ref 3.4–10.8)

## 2024-08-10 LAB — BASIC METABOLIC PANEL WITH GFR
BUN/Creatinine Ratio: 19 (ref 12–28)
BUN: 17 mg/dL (ref 8–27)
CO2: 23 mmol/L (ref 20–29)
Calcium: 9.6 mg/dL (ref 8.7–10.3)
Chloride: 100 mmol/L (ref 96–106)
Creatinine, Ser: 0.89 mg/dL (ref 0.57–1.00)
Glucose: 102 mg/dL — ABNORMAL HIGH (ref 70–99)
Potassium: 4 mmol/L (ref 3.5–5.2)
Sodium: 140 mmol/L (ref 134–144)
eGFR: 65 mL/min/1.73 (ref >59)

## 2024-08-12 ENCOUNTER — Ambulatory Visit (HOSPITAL_COMMUNITY)
Admission: RE | Admit: 2024-08-12 | Discharge: 2024-08-12 | Disposition: A | Discharge status: Home / Self Care | Source: Ambulatory Visit

## 2024-08-12 ENCOUNTER — Other Ambulatory Visit (HOSPITAL_COMMUNITY): Payer: Self-pay

## 2024-08-12 ENCOUNTER — Ambulatory Visit: Payer: Self-pay

## 2024-08-12 DIAGNOSIS — R55 Syncope and collapse: Secondary | ICD-10-CM | POA: Diagnosis not present

## 2024-08-12 DIAGNOSIS — S62521A Displaced fracture of distal phalanx of right thumb, initial encounter for closed fracture: Secondary | ICD-10-CM | POA: Diagnosis not present

## 2024-08-12 DIAGNOSIS — Z7951 Long term (current) use of inhaled steroids: Secondary | ICD-10-CM | POA: Diagnosis not present

## 2024-08-12 DIAGNOSIS — J4551 Severe persistent asthma with (acute) exacerbation: Secondary | ICD-10-CM | POA: Diagnosis not present

## 2024-08-14 ENCOUNTER — Ambulatory Visit (INDEPENDENT_AMBULATORY_CARE_PROVIDER_SITE_OTHER): Payer: Self-pay

## 2024-08-14 DIAGNOSIS — J309 Allergic rhinitis, unspecified: Secondary | ICD-10-CM | POA: Diagnosis not present

## 2024-08-16 ENCOUNTER — Ambulatory Visit: Admitting: Orthopedic Surgery

## 2024-08-16 VITALS — BP 127/76 | HR 77 | Ht 61.5 in | Wt 179.0 lb

## 2024-08-16 DIAGNOSIS — S62524A Nondisplaced fracture of distal phalanx of right thumb, initial encounter for closed fracture: Secondary | ICD-10-CM

## 2024-08-16 NOTE — Progress Notes (Signed)
 New Patient Visit  Assessment: Tammy [name] is a 78 y.o. female with the following: 1. Closed nondisplaced fracture of distal phalanx of right thumb, initial encounter  Plan: Tammy [name] had a syncopal episode, and sustained a fracture of the distal phalanx of the right thumb.  I think that this can be treated without surgery.  She was fitted for a thumb spica brace in clinic today.  I think this will be more effective for her, providing sufficient support, and protecting the fracture.  She states understanding.  She is in agreement with this plan.  I would like see her back in 2 weeks with new x-rays.  Follow-up: Return in about 2 weeks (around 08/30/2024).  Subjective:  Chief Complaint  Patient presents with   Fracture    R thumb DOI: 08/08/24    History of Present Illness: Tammy [name] is a 78 y.o. female who presents for evaluation of right thumb pain.  She injured her right thumb a few days ago.  She had a syncopal event, and fell.  She injured her right hand.  She had pain in the hand, so she presented to an urgent care center.  Radiographs demonstrated a fracture.  She was placed in AlumaFoam splint.  The splint does not stay in place.  Occasionally, she will bump her thumb, and this causes pain.  She denies numbness or tingling.  No pain elsewhere in the right hand.   Review of Systems: No fevers or chills No numbness or tingling No chest pain No shortness of breath No bowel or bladder dysfunction No GI distress No headaches   Medical History:  Past Medical History:  Diagnosis Date   Asthma    Coronary artery calcification seen on CT scan    Hyperlipidemia    Hypertension    Mild pulmonary hypertension (HCC)    PAT (paroxysmal atrial tachycardia) (HCC)    PVC's (premature ventricular contractions)    Sinus pause    Type 2 MI (myocardial infarction) [facility])     Past Surgical History:  Procedure Laterality Date   ABDOMINAL HYSTERECTOMY  1988   CATARACT  EXTRACTION W/PHACO Right 09/27/2019   Procedure: CATARACT EXTRACTION PHACO AND INTRAOCULAR LENS PLACEMENT (IOC) (CDE: 4.94);  Surgeon: Harrie Agent, MD;  Location: [facility];  Service: Ophthalmology;  Laterality: Right;   CATARACT EXTRACTION W/PHACO Left 10/18/2019   Procedure: CATARACT EXTRACTION PHACO AND INTRAOCULAR LENS PLACEMENT (IOC);  Surgeon: Harrie Agent, MD;  Location: [facility];  Service: Ophthalmology;  Laterality: Left;  CDE: 7.18   COLONOSCOPY N/A 08/11/2016   Procedure: COLONOSCOPY;  Surgeon: Claudis RAYMOND Rivet, MD;  [facility];  Service: Endoscopy;  Laterality: N/A;  730   ELBOW FRACTURE SURGERY     ESOPHAGOGASTRODUODENOSCOPY (EGD) WITH PROPOFOL  N/A 12/30/2022   Procedure: ESOPHAGOGASTRODUODENOSCOPY (EGD) WITH PROPOFOL ;  Surgeon: Eartha Angelia Sieving, MD;  [facility];  Service: Gastroenterology;  Laterality: N/A;  10:45 am   KNEE ARTHROSCOPY     POLYPECTOMY  12/30/2022   Procedure: POLYPECTOMY;  Surgeon: Eartha Angelia Sieving, MD;  [facility];  Service: Gastroenterology;;   SHOULDER OPEN ROTATOR CUFF REPAIR Right 10/06/2016   Procedure: OPEN ROTATOR CUFF REPAIR RIGHT SHOULDER;  Surgeon: Taft FORBES Minerva, MD;  Location: [facility];  Service: Orthopedics;  Laterality: Right;    Family History  Problem Relation Age of Onset   Eczema Father    Asthma Father    Cancer Maternal Grandmother    Social History  Tobacco Use   Smoking status: Former    Current packs/day: 0.00    Average packs/day: 0.8 packs/day for 15.0 years (11.3 ttl pk-yrs)    Types: Cigarettes    Start date: 11/27/1968    Quit date: 11/28/1983    Years since quitting: 40.7   Smokeless tobacco: Never  Vaping Use   Vaping status: Never Used  Substance Use Topics   Alcohol use: No   Drug use: No    No Known Allergies  Current Meds  Medication Sig   Accu-Chek Softclix Lancets lancets daily.   albuterol  (VENTOLIN  HFA) 108 (90 Base) MCG/ACT inhaler SMARTSIG:2  inhalation By Mouth Every 4 Hours PRN   albuterol  (VENTOLIN  HFA) 108 (90 Base) MCG/ACT inhaler Inhale 2 puffs into the lungs every 4 (four) hours as needed for wheezing or shortness of breath.   amLODipine (NORVASC) 5 MG tablet    aspirin  EC 81 MG tablet Take 1 tablet (81 mg total) by mouth daily. Swallow whole.   chlorpheniramine (CHLOR-TRIMETON) 4 MG tablet Take 4 mg by mouth every 4 (four) hours as needed for allergies.   EPINEPHrine  0.3 mg/0.3 mL IJ SOAJ injection Inject 0.3 mg into the muscle as needed for anaphylaxis.   famotidine  (PEPCID ) 40 MG tablet Take 1 tablet (40 mg total) by mouth 2 (two) times daily.   ipratropium (ATROVENT ) 0.03 % nasal spray Place 2 sprays into both nostrils 3 (three) times daily as needed for rhinitis.   mometasone-formoterol (DULERA ) 100-5 MCG/ACT AERO Inhale 2 puffs into the lungs 2 (two) times daily.   predniSONE  (STERAPRED UNI-PAK 21 TAB) 10 MG (21) TBPK tablet Take by mouth as directed.   rosuvastatin  (CRESTOR ) 5 MG tablet TAKE 1 TABLET (5 MG TOTAL) BY MOUTH DAILY.   Spacer/Aero-Holding Chambers DEVI 1 Device by Does not apply route as directed.   valsartan -hydrochlorothiazide  (DIOVAN -HCT) 160-25 MG tablet     Objective: BP 127/76   Pulse 77   Ht 5' 1.5 (1.562 m)   Wt 179 lb (81.2 kg)   BMI 33.27 kg/m   Physical Exam:  General: Alert and oriented. and No acute distress. Gait: Normal gait.  Right thumb with swelling.  Tenderness to palpation over the distal aspect of the phalanx.  Sensation is intact throughout the right thumb.  Fingers are warm and well-perfused.  Sensation is intact throughout the rest the hand.  No lacerations.  No lesions.  IMAGING: I personally reviewed images previously obtained from the ED   X-rays were previously obtained.  A minimally splays fracture of the distal phalanx to the right thumb.   New Medications:  No orders of the defined types were placed in this encounter.     Oneil DELENA Horde,  MD  08/17/2024 8:35 AM

## 2024-08-17 ENCOUNTER — Encounter: Payer: Self-pay | Admitting: Orthopedic Surgery

## 2024-08-21 ENCOUNTER — Ambulatory Visit (INDEPENDENT_AMBULATORY_CARE_PROVIDER_SITE_OTHER): Payer: Self-pay

## 2024-08-21 DIAGNOSIS — G4733 Obstructive sleep apnea (adult) (pediatric): Secondary | ICD-10-CM | POA: Diagnosis not present

## 2024-08-21 DIAGNOSIS — J309 Allergic rhinitis, unspecified: Secondary | ICD-10-CM

## 2024-08-27 DIAGNOSIS — E782 Mixed hyperlipidemia: Secondary | ICD-10-CM | POA: Diagnosis not present

## 2024-08-27 DIAGNOSIS — E1165 Type 2 diabetes mellitus with hyperglycemia: Secondary | ICD-10-CM | POA: Diagnosis not present

## 2024-08-28 ENCOUNTER — Ambulatory Visit (INDEPENDENT_AMBULATORY_CARE_PROVIDER_SITE_OTHER): Payer: Self-pay

## 2024-08-28 DIAGNOSIS — J309 Allergic rhinitis, unspecified: Secondary | ICD-10-CM

## 2024-09-02 ENCOUNTER — Encounter: Payer: Self-pay | Admitting: Internal Medicine

## 2024-09-02 ENCOUNTER — Ambulatory Visit: Attending: Internal Medicine | Admitting: Internal Medicine

## 2024-09-02 ENCOUNTER — Other Ambulatory Visit (INDEPENDENT_AMBULATORY_CARE_PROVIDER_SITE_OTHER)

## 2024-09-02 VITALS — BP 136/86 | HR 84 | Ht 61.0 in | Wt 180.4 lb

## 2024-09-02 DIAGNOSIS — R0609 Other forms of dyspnea: Secondary | ICD-10-CM | POA: Diagnosis not present

## 2024-09-02 DIAGNOSIS — R0602 Shortness of breath: Secondary | ICD-10-CM | POA: Diagnosis not present

## 2024-09-02 DIAGNOSIS — R55 Syncope and collapse: Secondary | ICD-10-CM | POA: Diagnosis not present

## 2024-09-02 NOTE — Progress Notes (Signed)
 Cardiology Office Note  Date: 09/02/2024   ID: Dail, Meece [birth year], MRN [id number]  PCP:  Shona Norleen PEDLAR, MD  Cardiologist:  Oneil Parchment, MD Electrophysiologist:  None   History of Present Illness: Tammy [name] is a 78 y.o. female  Patient had 1 syncopal event on 08/08/2024.  She had no warning signs of dizziness/lightheadedness or palpitations prior to LOC.  She had a feeling of warmth/feeling hot for couple of minutes followed by LOC.  She woke up on the ground and did not know why she passed out.  She injured her right wrist.  No recurrence of syncope since then.  However she has been feeling dizzy at least once a day for the last few weeks.  She also has been feeling short of breath with exertion and also at night this open position.  She cannot lay flat but can breathe better if she lays on her side.  Sometimes she has to use pillows to sleep comfortably.  No recurrent syncopal events since then.  No angina.  Past Medical History:  Diagnosis Date   Asthma    Coronary artery calcification seen on CT scan    Hyperlipidemia    Hypertension    Mild pulmonary hypertension (HCC)    PAT (paroxysmal atrial tachycardia)    PVC's (premature ventricular contractions)    Sinus pause    Type 2 MI (myocardial infarction) [facility])     Past Surgical History:  Procedure Laterality Date   ABDOMINAL HYSTERECTOMY  1988   CATARACT EXTRACTION W/PHACO Right 09/27/2019   Procedure: CATARACT EXTRACTION PHACO AND INTRAOCULAR LENS PLACEMENT (IOC) (CDE: 4.94);  Surgeon: Harrie Agent, MD;  Location: [facility];  Service: Ophthalmology;  Laterality: Right;   CATARACT EXTRACTION W/PHACO Left 10/18/2019   Procedure: CATARACT EXTRACTION PHACO AND INTRAOCULAR LENS PLACEMENT (IOC);  Surgeon: Harrie Agent, MD;  Location: [facility];  Service: Ophthalmology;  Laterality: Left;  CDE: 7.18   COLONOSCOPY N/A 08/11/2016   Procedure: COLONOSCOPY;  Surgeon: Claudis RAYMOND Rivet, MD;  Location: [facility];  Service:  Endoscopy;  Laterality: N/A;  730   ELBOW FRACTURE SURGERY     ESOPHAGOGASTRODUODENOSCOPY (EGD) WITH PROPOFOL  N/A 12/30/2022   Procedure: ESOPHAGOGASTRODUODENOSCOPY (EGD) WITH PROPOFOL ;  Surgeon: Eartha Angelia Sieving, MD;  Location: [facility];  Service: Gastroenterology;  Laterality: N/A;  10:45 am   KNEE ARTHROSCOPY     POLYPECTOMY  12/30/2022   Procedure: POLYPECTOMY;  Surgeon: Eartha Angelia Sieving, MD;  Location: [facility];  Service: Gastroenterology;;   SHOULDER OPEN ROTATOR CUFF REPAIR Right 10/06/2016   Procedure: OPEN ROTATOR CUFF REPAIR RIGHT SHOULDER;  Surgeon: Taft FORBES Minerva, MD;  Location: [facility];  Service: Orthopedics;  Laterality: Right;    Current Outpatient Medications  Medication Sig Dispense Refill   albuterol  (VENTOLIN  HFA) 108 (90 Base) MCG/ACT inhaler Inhale 2 puffs into the lungs every 4 (four) hours as needed for wheezing or shortness of breath. 18 g 1   aspirin  EC 81 MG tablet Take 1 tablet (81 mg total) by mouth daily. Swallow whole. 90 tablet 3   chlorpheniramine (CHLOR-TRIMETON) 4 MG tablet Take 4 mg by mouth every 4 (four) hours as needed for allergies.     EPINEPHrine  0.3 mg/0.3 mL IJ SOAJ injection Inject 0.3 mg into the muscle as needed for anaphylaxis. 2 each 1   famotidine  (PEPCID ) 40 MG tablet Take 1 tablet (40 mg total) by mouth 2 (two) times daily. 180 tablet 1   ipratropium (ATROVENT ) 0.03 %  nasal spray Place 2 sprays into both nostrils 3 (three) times daily as needed for rhinitis. 30 mL 5   mometasone-formoterol (DULERA ) 100-5 MCG/ACT AERO Inhale 2 puffs into the lungs 2 (two) times daily. 1 each 5   rosuvastatin  (CRESTOR ) 5 MG tablet TAKE 1 TABLET (5 MG TOTAL) BY MOUTH DAILY. 90 tablet 2   Spacer/Aero-Holding Chambers DEVI 1 Device by Does not apply route as directed. 1 each 1   valsartan -hydrochlorothiazide  (DIOVAN -HCT) 160-25 MG tablet      Accu-Chek Softclix Lancets lancets daily. (Patient not taking: Reported on 09/02/2024)      amLODipine (NORVASC) 5 MG tablet  (Patient not taking: Reported on 09/02/2024)     predniSONE  (STERAPRED UNI-PAK 21 TAB) 10 MG (21) TBPK tablet Take by mouth as directed. (Patient not taking: Reported on 09/02/2024)     No current facility-administered medications for this visit.   Allergies:  Patient has no known allergies.   Social History: The patient  reports that she quit smoking about 40 years ago. Her smoking use included cigarettes. She started smoking about 55 years ago. She has a 11.3 pack-year smoking history. She has never used smokeless tobacco. She reports that she does not drink alcohol and does not use drugs.   Family History: The patient's family history includes Asthma in her father; Cancer in her maternal grandmother; Eczema in her father.   ROS:  Please see the history of present illness. Otherwise, complete review of systems is positive for none.  All other systems are reviewed and negative.   Physical Exam: VS:  Ht 5' 1 (1.549 m)   Wt 180 lb 6.4 oz (81.8 kg)   BMI 34.09 kg/m , BMI Body mass index is 34.09 kg/m.  Wt Readings from Last 3 Encounters:  09/02/24 180 lb 6.4 oz (81.8 kg)  08/16/24 179 lb (81.2 kg)  04/05/24 184 lb 12.8 oz (83.8 kg)    General: Patient appears comfortable at rest. HEENT: Conjunctiva and lids normal, oropharynx clear with moist mucosa. Neck: Supple, no elevated JVP or carotid bruits, no thyromegaly. Lungs: Clear to auscultation, rhonchi and wheezing Cardiac: Regular rate and rhythm, no S3 or significant systolic murmur, no pericardial rub. Abdomen: Soft, nontender, no hepatomegaly, bowel sounds present, no guarding or rebound. Extremities: No pitting edema, distal pulses 2+. Skin: Warm and dry. Musculoskeletal: No kyphosis. Neuropsychiatric: Alert and oriented x3, affect grossly appropriate.  Recent Labwork: 08/09/2024: BUN 17; Creatinine, Ser 0.89; Hemoglobin 14.6; Platelets 354; Potassium 4.0; Sodium 140     Component Value  Date/Time   CHOL 108 01/19/2023 1033   TRIG 70 01/19/2023 1033   HDL 39 (L) 01/19/2023 1033   CHOLHDL 2.8 01/19/2023 1033   VLDL 14 01/19/2023 1033   LDLCALC 55 01/19/2023 1033     Assessment and Plan:  Syncope - Patient had syncope with no warning signs except for feeling warmth/heart for couple of minutes before passing out.  She reported feeling dizzy at least once a day in the last few weeks.  No recurrent syncopal events.  Prior echocardiogram from 2023 was unremarkable.  Update echocardiogram and obtain 2-week event monitor, live.  No CAD by CT cardiac in 2023, no need of stress test.  DOE - Patient has chronic stable DOE.  She is not able to lay flat at night in the bed but she is able to lay on her side with no issues.  Symptoms she has to use pillows to sleep comfortably.  Physical exam remarkable for rhonchi and wheezing.  Obtain BNP.  Update echocardiogram as above. - She has imaging evidence of diffuse bronchial wall thickening consistent with smoking-related small airway disease in 2023.  She used to follow-up with pulm.  Elevated coronary calcium  score 98.2 (65th percentile for age and sex matched control) Mild nonobstructive CAD by CT cardiac - Denies having any angina or DOE.  Continue rosuvastatin  5 mg nightly. - ER precautions for chest pain provided.  Discussed symptoms of CAD and MI.  HTN, controlled - Continue valsartan -HCTZ 160-25 mg once daily.   30 minutes spent in remainder prior records, specialist notes, more than 3 labs, discussion of the above problems with the patient and documentation.  Medication Adjustments/Labs and Tests Ordered: Current medicines are reviewed at length with the patient today.  Concerns regarding medicines are outlined above.    Disposition:  Follow up pending results  Signed, Tysean Vandervliet Arleta Maywood, MD, 09/02/2024 9:03 AM    [facility] at [facility] [address] [place], [place] [address]

## 2024-09-02 NOTE — Patient Instructions (Signed)
 Medication Instructions:   Your physician recommends that you continue on your current medications as directed. Please refer to the Current Medication list given to you today.   Labwork:  BNP today Testing/Procedures:     Your physician has requested that you have an echocardiogram. Echocardiography is a painless test that uses sound waves to create images of your heart. It provides your doctor with information about the size and shape of your heart and how well your heart's chambers and valves are working. This procedure takes approximately one hour. There are no restrictions for this procedure. Please do NOT wear cologne, perfume, aftershave, or lotions (deodorant is allowed). Please arrive 15 minutes prior to your appointment time.  Please note: We ask at that you not bring children with you during ultrasound (echo/ vascular) testing. Due to room size and safety concerns, children are not allowed in the ultrasound rooms during exams. Our front office staff cannot provide observation of children in our lobby area while testing is being conducted. An adult accompanying a patient to their appointment will only be allowed in the ultrasound room at the discretion of the ultrasound technician under special circumstances. We apologize for any inconvenience.   ZIO XT- Long Term Monitor Instructions   Your physician has requested you wear your ZIO patch monitor_____14__days.   This is a single patch monitor.  [employer] supplies one patch monitor per enrollment.  Additional stickers are not available.   Do not shower for the first 24 hours.  You may shower after the first 24 hours.   Press button if you feel a symptom. You will hear a small click.  Record Date, Time and Symptom in the Patient Log Book.   When you are ready to remove patch, follow instructions on last 2 pages of Patient Log Book.  Stick patch monitor onto last page of Patient Log Book.   Place Patient Log Book in [place] box.  Use  locking tab on box and tape box closed securely.  The [employer] and [employer] has [employer] on it.  Please place in mailbox as soon as possible.  Your physician should have your test results approximately 7 days after the monitor has been mailed back to [facility].   Call [facility] Customer Care at [phone] if you have questions regarding your ZIO XT patch monitor.  Call them immediately if you see an orange light blinking on your monitor.   If your monitor falls off in less than 4 days contact our Monitor department at [phone].  If your monitor becomes loose or falls off after 4 days call [employer] at [phone] for suggestions on securing your monitor.   Follow-Up: To be determined  Any Other Special Instructions Will Be Listed Below (If Applicable).  If you need a refill on your cardiac medications before your next appointment, please call your pharmacy.

## 2024-09-03 ENCOUNTER — Encounter: Payer: Self-pay | Admitting: Orthopedic Surgery

## 2024-09-03 ENCOUNTER — Other Ambulatory Visit (INDEPENDENT_AMBULATORY_CARE_PROVIDER_SITE_OTHER): Payer: Self-pay

## 2024-09-03 ENCOUNTER — Ambulatory Visit: Admitting: Orthopedic Surgery

## 2024-09-03 ENCOUNTER — Ambulatory Visit: Payer: Self-pay

## 2024-09-03 DIAGNOSIS — R55 Syncope and collapse: Secondary | ICD-10-CM | POA: Diagnosis not present

## 2024-09-03 DIAGNOSIS — S62524A Nondisplaced fracture of distal phalanx of right thumb, initial encounter for closed fracture: Secondary | ICD-10-CM | POA: Diagnosis not present

## 2024-09-03 DIAGNOSIS — S62521A Displaced fracture of distal phalanx of right thumb, initial encounter for closed fracture: Secondary | ICD-10-CM | POA: Diagnosis not present

## 2024-09-03 DIAGNOSIS — E1165 Type 2 diabetes mellitus with hyperglycemia: Secondary | ICD-10-CM | POA: Diagnosis not present

## 2024-09-03 DIAGNOSIS — R059 Cough, unspecified: Secondary | ICD-10-CM | POA: Diagnosis not present

## 2024-09-03 DIAGNOSIS — S62524D Nondisplaced fracture of distal phalanx of right thumb, subsequent encounter for fracture with routine healing: Secondary | ICD-10-CM

## 2024-09-03 DIAGNOSIS — Z23 Encounter for immunization: Secondary | ICD-10-CM | POA: Diagnosis not present

## 2024-09-03 DIAGNOSIS — J4551 Severe persistent asthma with (acute) exacerbation: Secondary | ICD-10-CM | POA: Diagnosis not present

## 2024-09-03 DIAGNOSIS — K219 Gastro-esophageal reflux disease without esophagitis: Secondary | ICD-10-CM | POA: Diagnosis not present

## 2024-09-03 DIAGNOSIS — Z0000 Encounter for general adult medical examination without abnormal findings: Secondary | ICD-10-CM | POA: Diagnosis not present

## 2024-09-03 DIAGNOSIS — G4719 Other hypersomnia: Secondary | ICD-10-CM | POA: Diagnosis not present

## 2024-09-03 DIAGNOSIS — R053 Chronic cough: Secondary | ICD-10-CM | POA: Diagnosis not present

## 2024-09-03 DIAGNOSIS — I10 Essential (primary) hypertension: Secondary | ICD-10-CM | POA: Diagnosis not present

## 2024-09-03 DIAGNOSIS — Z0001 Encounter for general adult medical examination with abnormal findings: Secondary | ICD-10-CM | POA: Diagnosis not present

## 2024-09-03 NOTE — Progress Notes (Signed)
 Return patient Visit  Assessment: Tammy [name] is a 78 y.o. female with the following: 1. Closed nondisplaced fracture of distal phalanx of right thumb, subsequent encounter  Plan: Tammy [name] sustained a fracture of the distal phalanx of the right thumb.  Injury was about a month ago.  Radiographs obtained today remain stable.  Her pain overall is better.  She still has some tenderness.  Recommend she continue to use the brace to protect her thumb for the next 2 weeks.  After that, can transition out of the brace.  She states understanding.  She will follow-up in 1 month.   Follow-up: Return in about 4 weeks (around 10/01/2024).  Subjective:  Chief Complaint  Patient presents with   Hand Injury    Right thumb fracture/ improving but still tender    History of Present Illness: Tammy [name] is a 78 y.o. female who returns for evaluation of right thumb pain.  She injured her right thumb about a month ago, after syncopal episode.  She has been using a thumb spica brace.  This protects her thumb.  Her pain overall is better.  However, she does note tenderness, especially when she bumps her finger.  She takes Tylenol  occasionally.  Review of Systems: No fevers or chills No numbness or tingling No chest pain No shortness of breath No bowel or bladder dysfunction No GI distress No headaches    Objective: There were no vitals taken for this visit.  Physical Exam:  General: Alert and oriented. and No acute distress. Gait: Normal gait.  Right thumb with minimal swelling.  She continues to have tenderness distally.  Gentle range of motion at the IP joint is tolerated.  Fingers are warm and well-perfused.  Sensation is intact throughout the right thumb.   IMAGING: I personally ordered and reviewed the following images  X-rays of the right thumb were obtained in clinic today.  These are compared to prior x-rays.  There is a comminuted fracture of the distal phalanx.  No  obvious intra-articular extension.  No interval displacement.  No bony lesions.  No new injuries.  Impression: Stable right thumb distal phalanx fracture  New Medications:  No orders of the defined types were placed in this encounter.     Tammy DELENA Horde, MD  09/03/2024 9:55 AM

## 2024-09-03 NOTE — Patient Instructions (Signed)
 Continue to use the brace for the next 2 weeks.  After that, you can transition out of the brace.

## 2024-09-03 NOTE — Telephone Encounter (Signed)
 Pt states she's having a cough that's get to a point when she can't breath - neb helps sometimes. Pt states she she has an appt with dr wert on Thursday.

## 2024-09-03 NOTE — Telephone Encounter (Signed)
 FYI Only or Action Required?: FYI only for provider.  Patient is followed in Pulmonology for asthma, last seen on 11/25/2022 by Darlean Ozell NOVAK, MD.  Called Nurse Triage reporting Shortness of Breath.  Symptoms began several weeks ago.  Interventions attempted: Rescue inhaler.  Symptoms are: gradually worsening.  Triage Disposition: Go to ED Now (Notify PCP)  Patient/caregiver understands and will follow disposition?: Yes  Copied from CRM [id number]. Topic: Clinical - Red Word Triage >> Sep 03, 2024  2:56 PM Russell PARAS wrote: Red Word that prompted transfer to Nurse Triage:   Pt of Dr. Darlean  Cough became worse in 07/2024 Wheezing SOB Typically dry cough Was advised by PCP and allergist to contact pulm provider  No fever History of allergies Uses nasal spray   Prefers to be seen in [place] location Reason for Disposition  [1] MODERATE difficulty breathing (e.g., speaks in phrases, SOB even at rest, pulse 100-120) AND [2] NEW-onset or WORSE than normal  Answer Assessment - Initial Assessment Questions Pt calls and states she's had SOB and cough since sept/2025. Pt audibly wheezing and has deep cough. States she saw allergist and PCP and was recommended to sched with her pulmonologist. RN recommended ER for sxs currently, Pt agreeable, RN also scheduled app for Thursday at pt request with Dr. Darlean  1. RESPIRATORY STATUS: Describe your breathing? (e.g., wheezing, shortness of breath, unable to speak, severe coughing)      SOB wheezing cough 2. ONSET: When did this breathing problem begin?      sept 3. PATTERN Does the difficult breathing come and go, or has it been constant since it started?      Constant; worsens with exertion or talking in lengthy sentences 4. SEVERITY: How bad is your breathing? (e.g., mild, moderate, severe)      moderate 5. RECURRENT SYMPTOM: Have you had difficulty breathing before? If Yes, ask: When was the last time? and What happened  that time?      Yes 6. CARDIAC HISTORY: Do you have any history of heart disease? (e.g., heart attack, angina, bypass surgery, angioplasty)      Unsure at this time 7. LUNG HISTORY: Do you have any history of lung disease?  (e.g., pulmonary embolus, asthma, emphysema)     Severe asthma 8. CAUSE: What do you think is causing the breathing problem?      Unsure at this time 9. OTHER SYMPTOMS: Do you have any other symptoms? (e.g., chest pain, cough, dizziness, fever, runny nose)     denies 10. O2 SATURATION MONITOR:  Do you use an oxygen saturation monitor (pulse oximeter) at home? If Yes, ask: What is your reading (oxygen level) today? What is your usual oxygen saturation reading? (e.g., 95%)       denies  12. TRAVEL: Have you traveled out of the country in the last month? (e.g., travel history, exposures)       denies  Protocols used: Breathing Difficulty-A-AH

## 2024-09-05 ENCOUNTER — Encounter: Payer: Self-pay | Admitting: Internal Medicine

## 2024-09-05 ENCOUNTER — Ambulatory Visit: Admitting: Internal Medicine

## 2024-09-05 VITALS — BP 147/83 | HR 94 | Ht 61.0 in | Wt 179.0 lb

## 2024-09-05 DIAGNOSIS — R058 Other specified cough: Secondary | ICD-10-CM

## 2024-09-05 DIAGNOSIS — Z87891 Personal history of nicotine dependence: Secondary | ICD-10-CM | POA: Diagnosis not present

## 2024-09-05 MED ORDER — BUDESONIDE 0.25 MG/2ML IN SUSP: RESPIRATORY_TRACT | 12 refills | Status: AC

## 2024-09-05 MED ORDER — PANTOPRAZOLE SODIUM 40 MG PO TBEC: 40.0000 mg | DELAYED_RELEASE_TABLET | Freq: Every day | ORAL | 2 refills | Status: DC

## 2024-09-05 MED ORDER — TRAMADOL HCL 50 MG PO TABS: 50.0000 mg | ORAL_TABLET | ORAL | 0 refills | Status: AC | PRN

## 2024-09-05 MED ORDER — PREDNISONE 10 MG PO TABS: ORAL_TABLET | ORAL | 0 refills | Status: AC

## 2024-09-05 MED ORDER — ALBUTEROL SULFATE (2.5 MG/3ML) 0.083% IN NEBU: 2.5000 mg | INHALATION_SOLUTION | RESPIRATORY_TRACT | 12 refills | Status: AC | PRN

## 2024-09-05 NOTE — Patient Instructions (Addendum)
 Pantoprazole  (protonix ) 40 mg   Take  30-60 min before first meal of the day and Pepcid  (famotidine )  40 mg after supper until return to office - this is the best way to tell whether stomach acid is contributing to your problem.    Albuterol  and budesonide  up to every 4 hours as needed and avoid the inhalers for now    Prednisone  10 mg take  4 each am x 2 days,   2 each am x 2 days,  1 each am x 2 days and stop   For cough mucinex  dm every 12 hours and supplement tramadol  50 mg up to every 4 hours   Only use the chlorpheniramine about a hour before bed x 1 or 2 (ok to take with pepcid )   See Dr Iva as planned to re-evaluate your maintenance medications you should be staying on daily to prevent these flares   Pulmonary follow up is as needed with all meds in hand

## 2024-09-05 NOTE — Progress Notes (Signed)
 Tammy [name], female    DOB: 1946-01-29    MRN: [id number]   Brief patient profile:  2  yobf  stopped smoking age 78  referred to pulmonary clinic in [place]  07/12/2022 by Tammy [name] for acute resp failure/ severe UACS  with w/u by Tammy [name] 2019 c/w GERD dx.   Admission date:  06/01/2022  Admitting Physician  Tammy KATHEE Mems, DO Discharge Date:  06/09/2022    Discharge Diagnosis  Elevated troponin [R77.8] Acute respiratory failure with hypoxia (HCC) [J96.01] Severe asthma with exacerbation, unspecified whether persistent [J45.901] Single subsegmental pulmonary embolism without acute cor pulmonale (HCC) [I26.93]     Principal Problem:   Acute respiratory failure with hypoxia (HCC)   Essential hypertension   Pulmonary Embolus RULED OUT   Leukocytosis   Hyperlipidemia   Severe asthma with exacerbation   Elevated troponin  Acute respiratory failure with hypoxia (HCC) -Patient is a reformed smoker -- VQ scan and CT chest suggest possible emphysema/COPD component given smoking history this is likely -Patient also had significant allergic rhinitis/reactive airway disease -Patient reports no prior official diagnosis of asthma in the past COPD Vs Asthma Vs reactive airway disease in a patient with significant allergic rhinitis -Patient will benefit from outpatient PFT/spirometry once acute symptoms resolve to better delineate exact diagnosis -Patient has no oxygen requirement at baseline Pulmonary consult from Tammy [name] appreciated -PE work-up essentially negative -Cardiology input appreciated -COVID-negative -Much improved respiratory status after steroids, -Hypoxia resolved, Patient has been weaned off oxygen   History of Present Illness  07/12/2022  Pulmonary/ 1st office eval/ Tammy [name] / [place] [facility] re refractory cough  Chief Complaint  Patient presents with   Hospitalization Follow-up    HFU from admission to [facility] 7/5-7/13 for chronic resp failure.  Patient has still had  a persistent cough since coming home.   Onset nasal stuffy nose /runny late 1970s allergy  shots helped  but still needed allergy  meds esp spring / fall and worse x 2010 rx by Tammy [name] and bette typically p 2 weeks  rx nasal spray, cortisol, albuterol , short term only  This episode dates back to ? Mid June 2023  Dyspnea:  walking inside house ok  Cough: brought on by use of voice dry  hack worse since x one week p last pred 06/17/22 Sleep: pillows on flat bed and still coughs immediately at hs  SABA use: multiple inhalers not helping Rec Gabapentin  100 mg three times daily and every few days increase to 300 mg three times daily  Pantoprazole  (protonix ) 40 mg  (or omeprazole  20 x 2)   Take  30-60 min before first meal of the day and Pepcid  (famotidine )  20 mg after supper until return to office   For drainage / throat tickle try take CHLORPHENIRAMINE  4 mg  Prednisone  10 mg take  4 each am x 2 days,   2 each am x 2 days,  1 each am x 2 days and stop  For breathing difficulty > albuterol   neb up to every 4 hours as needed Take delsym  two tsp every 12 hours and supplement if needed with  Tylenol  #3   up to 1-2 every 4 hours Once you have eliminated the cough for 3 straight days try reducing the Tylenol  #3 first,  then the delsym  as tolerated.    Valsartan  160-25 one daily in place of losartan   Please schedule a follow up office visit in 2 weeks, sooner if needed  with all medications /inhalers/  solutions in hand         07/29/2022  f/u ov/[facility]ammy office/Tammy [name] re: cough x  maint on gerd rx/ did not titrate up gabapentin , last saw Tammy [name]  ov 09/18/18  Dx: severe LPR, worse off GERD rx so rec continue gerd rx indefinitely and f/u q 6 m (not done)  Chief Complaint  Patient presents with   Follow-up    Feels better but cough is starting to come back   Dyspnea:  not really limited by breathing if not coughing  Cough: dry/ raspy with hoarseness  Sleeping: better p h2  SABA use: none  02: none  Covid  status: all x the last  Rec Prednisone  Take 4 for two days three for two days two for two days one for two days  Gabapentin  100 mg three times daily and every few days increase by 100 mg until cough is 100% maximum dose 300 mg three times daily  Pantoprazole  (protonix ) 40 mg  (or omeprazole  20 x 2)   Take  30-60 min before first meal of the day and Pepcid  (famotidine )  20 mg after supper until return to office -  For drainage / throat tickle try take CHLORPHENIRAMINE  4 mg    For breathing difficulty > albuterol   neb up to every 4 hours as needed Take delsym  two tsp every 12 hours and supplement if needed with  Tylenol  #3   up to 1-2 every 4 hour Once you have eliminated the cough for 3 straight days try reducing the Tylenol  #3 first,  then the delsym  as tolerated.     referral to allergy  in [place]  > see note       11/25/2022  f/u ov/Tammy [name] office/Tammy [name] re: uacs  maint on gerd rx  / stopped gabapentin  due to legs hurting/ improved and no worse cough Chief Complaint  Patient presents with   Follow-up    Cough has improved   Dyspnea:  not really limited Cough: much better  Sleeping: flat bed with pillows  SABA use: confused with maint vs prns, has brovana /pulmocort/yupelri  but not using  02: none  Covid status: vax max  Very confused with instructions re inhalers/ nebs so not using any  Rec Be sure protonix  (pantoprazole  ) 40 mg Take 30- 60 min before your first and last meals of the day  Continue pepcid  20 mg one  and chlorpheniramine 4mg  1-2  an hour before bedtime  For cough > tessalon  200 mg every 6 hours as needed For breathing problems >  albuterol  nebulizer as needed and if needing it more than a few times a week restart your aformoterol/budesonide  twice daily   Pulmonary follow up is if needed >>> bring all medications /inhalers/ solutions in hand   09/05/2024  ACUTE  ov/[facility]ammy [name] office/Tammy [name] re: recurrent cough x early sept 2025   maint on pepcid  20 mg bid  did not   bring meds / tessalon  not helping / seeing Tammy [name] now for allergy  rx ?  maint on dulera  100 per notes    Chief Complaint  Patient presents with   Cough    Acute - shob due to cough  Dyspnea:  still shopping pushing a cart  Cough: harsh clear mucus worse when lie down  Sleeping: bed is flat/ 2 pillows under head  at hs  since covid  or cough is worse  SABA use: every 2-3 h  02: none    No obvious day to day or daytime variability or assoc  purulent sputum or mucus plugs or hemoptysis or cp or chest tightness, subjective wheeze or overt sinus or hb symptoms.    Also denies any obvious fluctuation of symptoms with weather or environmental changes or other aggravating or alleviating factors except as outlined above   No unusual exposure hx or h/o childhood pna/ asthma or knowledge of premature birth.  Current Allergies, Complete Past Medical History, Past Surgical History, Family History, and Social History were reviewed in [employer] record.  ROS  The following are not active complaints unless bolded Hoarseness, sore throat/globus , dysphagia, dental problems, itching, sneezing,  nasal congestion or discharge of excess mucus or purulent secretions, ear ache,   fever, chills, sweats, unintended wt loss or wt gain, classically pleuritic or exertional cp,  orthopnea pnd or arm/hand swelling  or leg swelling, presyncope, palpitations, abdominal pain, anorexia, nausea, vomiting, diarrhea  or change in bowel habits or change in bladder habits, change in stools or change in urine, dysuria, hematuria,  rash, arthralgias, visual complaints, headache, numbness, weakness or ataxia or problems with walking or coordination,  change in mood or  memory.        Current Meds  Medication Sig   albuterol  (VENTOLIN  HFA) 108 (90 Base) MCG/ACT inhaler Inhale 2 puffs into the lungs every 4 (four) hours as needed for wheezing or shortness of breath.   aspirin  EC 81 MG tablet Take 1 tablet  (81 mg total) by mouth daily. Swallow whole.   chlorpheniramine (CHLOR-TRIMETON) 4 MG tablet Take 4 mg by mouth every 4 (four) hours as needed for allergies.   EPINEPHrine  0.3 mg/0.3 mL IJ SOAJ injection Inject 0.3 mg into the muscle as needed for anaphylaxis.   famotidine  (PEPCID ) 40 MG tablet Take 1 tablet (40 mg total) by mouth 2 (two) times daily.   ipratropium (ATROVENT ) 0.03 % nasal spray Place 2 sprays into both nostrils 3 (three) times daily as needed for rhinitis.   mometasone-formoterol (DULERA ) 100-5 MCG/ACT AERO Inhale 2 puffs into the lungs 2 (two) times daily.   rosuvastatin  (CRESTOR ) 5 MG tablet TAKE 1 TABLET (5 MG TOTAL) BY MOUTH DAILY.   Spacer/Aero-Holding Chambers DEVI 1 Device by Does not apply route as directed.   valsartan -hydrochlorothiazide  (DIOVAN -HCT) 160-25 MG tablet                   Past Medical History:  Diagnosis Date   Asthma    Hypertension         Objective:     Wts  09/05/2024        179   11/25/2022      182  08/26/2022        185   07/29/22 182 lb (82.6 kg)  07/25/22 180 lb 6.4 oz (81.8 kg)  07/12/22 178 lb 12.8 oz (81.1 kg)      Vital signs reviewed  09/05/2024  - Note at rest 02 sats  94% on RA   General appearance:    amb bf hoarse with harsh dry cough on insp   HEENT : Oropharynx  clear         NECK :  without  apparent JVD/ palpable Nodes/TM    LUNGS: no acc muscle use,  Nl contour chest which is clear to A and P bilaterally without cough on insp or exp maneuvers   CV:  RRR  no s3 or murmur or increase in P2, and no edema   ABD:  soft and nontender   MS:  Gait nl   ext warm without deformities Or obvious joint restrictions  calf tenderness, cyanosis or clubbing    SKIN: warm and dry without lesions    NEURO:  alert, approp, nl sensorium with  no motor or cerebellar deficits apparent     Assessment   Assessment & Plan Upper airway cough syndrome Onset was June 2023 but original cough was 2005 onset of allergy   symptoms 1980s took shots x 6 y  helped  Tammy [name] eval  09/18/18  Dx: severe LPR, worse off GERD rx so rec continue gerd rx indefinitely and f/u q 6 m (not done)  - Allergy  screen 07/12/2022 >  Eos 0.3  /  IgE  105 - 07/12/2022 started gabapentin  titrate to max of 100 mg x 3 tid / max gerd rx and 1st gen H1 blockers per guidelines  Hs and f/u q 2 weeks/ off losartan   - 07/29/2022 transiently improved but recurred w/in a week of d/c pred x 6 so rec pred x 8 days, titrate gabapentin  to max of 300 tid and allergy  eval > referred  - 08/26/2022 increased gabapentin  to 300 mg bid and f/u allergy  - 11/25/2022 doing fine off gabapentin  and all inhalers so rec just max gerd rx and  1st gen H1 blockers per guidelines  -Spirometry 04/06/24 nl      - 09/05/2024 flare of cough / hoarseness since Sept  2025 off main rx so restart alb/budesonide   up to qid and ppi/ Prednisone  10 mg take  4 each am x 2 days,   2 each am x 2 days,  1 each am x 2 days and stop   Of the three most common causes of  Sub-acute / recurrent or chronic cough, only one (GERD)  can actually contribute to/ trigger  the other two (asthma and post nasal drip syndrome)  and perpetuate the cylce of cough.  While not intuitively obvious, many patients with chronic low grade reflux do not cough until there is a primary insult that disturbs the protective epithelial barrier and exposes sensitive nerve endings.   This is typically viral but can due to PNDS and  either may apply here.   >>>  The point is that once this occurs, it is difficult to eliminate the cycle  using anything but a maximally effective acid suppression regimen at least in the short run, accompanied by an appropriate diet to address non acid GERD and control / eliminate the cough itself for at least 3 days with ultram  an change to neb saba/ICS until cough is better then resume dulera  100 via spacer    >>> also added 6 day taper off  Prednisone  starting at 40 mg per day in case of component  of Th-2 driven upper or lower airways inflammation (if cough responds short term only to relapse before return while will on full rx for uacs (as above), then that would point to allergic rhinitis/ asthma or eos bronchitis as alternative dx)    >>> also added 1st gen H1 blockers per guidelines  at hs to reduce PNDS   F/u with Tammy Iva planned , here prn with all meds in hand using a trust but verify approach to confirm accurate Medication  Reconciliation The principal here is that until we are certain that the  patients are doing what we've asked, it makes no sense to ask them to do more.          Each maintenance medication was reviewed in detail including  emphasizing most importantly the difference between maintenance and prns and under what circumstances the prns are to be triggered using an action plan format where appropriate.  Total time for H and P, chart review, counseling, reviewing hfa/ neb device(s) and generating customized AVS unique to this office visit / same day charting = 35 min ACUTE eval          AVS  Patient Instructions  Pantoprazole  (protonix ) 40 mg   Take  30-60 min before first meal of the day and Pepcid  (famotidine )  40 mg after supper until return to office - this is the best way to tell whether stomach acid is contributing to your problem.    Albuterol  and budesonide  up to every 4 hours as needed and avoid the inhalers for now    Prednisone  10 mg take  4 each am x 2 days,   2 each am x 2 days,  1 each am x 2 days and stop   For cough mucinex  dm every 12 hours and supplement tramadol  50 mg up to every 4 hours   Only use the chlorpheniramine about a hour before bed x 1 or 2 (ok to take with pepcid )   See Tammy Iva as planned to re-evaluate your maintenance medications you should be staying on daily to prevent these flares   Pulmonary follow up is as needed with all meds in hand        Ozell America, MD 09/07/2024

## 2024-09-07 NOTE — Assessment & Plan Note (Addendum)
 Onset was June 2023 but original cough was 2005 onset of allergy  symptoms 1980s took shots x 6 y  helped  [facility] eval  09/18/18  Dx: severe LPR, worse off GERD rx so rec continue gerd rx indefinitely and f/u q 6 m (not done)  - Allergy  screen 07/12/2022 >  Eos 0.3  /  IgE  105 - 07/12/2022 started gabapentin  titrate to max of 100 mg x 3 tid / max gerd rx and 1st gen H1 blockers per guidelines  Hs and f/u q 2 weeks/ off losartan   - 07/29/2022 transiently improved but recurred w/in a week of d/c pred x 6 so rec pred x 8 days, titrate gabapentin  to max of 300 tid and allergy  eval > referred  - 08/26/2022 increased gabapentin  to 300 mg bid and f/u allergy  - 11/25/2022 doing fine off gabapentin  and all inhalers so rec just max gerd rx and  1st gen H1 blockers per guidelines  -Spirometry 04/06/24 nl      - 09/05/2024 flare of cough / hoarseness since Sept  2025 off main rx so restart alb/budesonide   up to qid and ppi/ Prednisone  10 mg take  4 each am x 2 days,   2 each am x 2 days,  1 each am x 2 days and stop   Of the three most common causes of  Sub-acute / recurrent or chronic cough, only one (GERD)  can actually contribute to/ trigger  the other two (asthma and post nasal drip syndrome)  and perpetuate the cylce of cough.  While not intuitively obvious, many patients with chronic low grade reflux do not cough until there is a primary insult that disturbs the protective epithelial barrier and exposes sensitive nerve endings.   This is typically viral but can due to PNDS and  either may apply here.   >>>  The point is that once this occurs, it is difficult to eliminate the cycle  using anything but a maximally effective acid suppression regimen at least in the short run, accompanied by an appropriate diet to address non acid GERD and control / eliminate the cough itself for at least 3 days with ultram  an change to neb saba/ICS until cough is better then resume dulera  100 via spacer    >>> also added 6 day taper  off  Prednisone  starting at 40 mg per day in case of component of Th-2 driven upper or lower airways inflammation (if cough responds short term only to relapse before return while will on full rx for uacs (as above), then that would point to allergic rhinitis/ asthma or eos bronchitis as alternative dx)    >>> also added 1st gen H1 blockers per guidelines  at hs to reduce PNDS   F/u with Dr Iva planned , here prn with all meds in hand using a trust but verify approach to confirm accurate Medication  Reconciliation The principal here is that until we are certain that the  patients are doing what we've asked, it makes no sense to ask them to do more.          Each maintenance medication was reviewed in detail including emphasizing most importantly the difference between maintenance and prns and under what circumstances the prns are to be triggered using an action plan format where appropriate.  Total time for H and P, chart review, counseling, reviewing hfa/ neb device(s) and generating customized AVS unique to this office visit / same day charting = 35 min ACUTE eval

## 2024-09-11 ENCOUNTER — Ambulatory Visit (INDEPENDENT_AMBULATORY_CARE_PROVIDER_SITE_OTHER): Payer: Self-pay

## 2024-09-11 DIAGNOSIS — J309 Allergic rhinitis, unspecified: Secondary | ICD-10-CM | POA: Diagnosis not present

## 2024-09-14 DIAGNOSIS — Z23 Encounter for immunization: Secondary | ICD-10-CM | POA: Diagnosis not present

## 2024-09-18 ENCOUNTER — Ambulatory Visit (INDEPENDENT_AMBULATORY_CARE_PROVIDER_SITE_OTHER): Payer: Self-pay

## 2024-09-18 DIAGNOSIS — J309 Allergic rhinitis, unspecified: Secondary | ICD-10-CM | POA: Diagnosis not present

## 2024-09-19 ENCOUNTER — Ambulatory Visit (HOSPITAL_COMMUNITY)
Admission: RE | Admit: 2024-09-19 | Discharge: 2024-09-19 | Disposition: A | Discharge status: Home / Self Care | Source: Ambulatory Visit | Attending: Internal Medicine | Admitting: Internal Medicine

## 2024-09-19 DIAGNOSIS — R55 Syncope and collapse: Secondary | ICD-10-CM | POA: Diagnosis not present

## 2024-09-19 LAB — ECHOCARDIOGRAM COMPLETE
Area-P 1/2: 3.85 cm2
S' Lateral: 2.4 cm

## 2024-09-19 NOTE — Progress Notes (Signed)
*  PRELIMINARY RESULTS* Echocardiogram 2D Echocardiogram has been performed.  Tammy [name] 09/19/2024, 2:57 PM

## 2024-09-20 ENCOUNTER — Ambulatory Visit: Payer: Self-pay | Admitting: Internal Medicine

## 2024-10-01 ENCOUNTER — Other Ambulatory Visit (INDEPENDENT_AMBULATORY_CARE_PROVIDER_SITE_OTHER): Payer: Self-pay

## 2024-10-01 ENCOUNTER — Ambulatory Visit: Admitting: Orthopedic Surgery

## 2024-10-01 ENCOUNTER — Encounter: Payer: Self-pay | Admitting: Orthopedic Surgery

## 2024-10-01 DIAGNOSIS — S62524D Nondisplaced fracture of distal phalanx of right thumb, subsequent encounter for fracture with routine healing: Secondary | ICD-10-CM

## 2024-10-01 NOTE — Progress Notes (Signed)
 Return patient Visit  Assessment: Tammy [name] is a 78 y.o. female with the following: 1. Closed nondisplaced fracture of distal phalanx of right thumb, subsequent encounter  Plan: Tammy [name] sustained a fracture of the distal phalanx of the right thumb.  Injury was almost 2 months ago.  Radiographs remain stable.  There is some callus formation, but no obvious bony bridging at the fracture site.  She has some residual swelling, and some tenderness.  She feels as though it is improving.  As such, we will continue to monitor.  It is possible that she develops a fibrous nonunion.  This was discussed.  Follow-up in 1 month.  Follow-up: Return in about 4 weeks (around 10/29/2024).  Subjective:  Chief Complaint  Patient presents with   Hand Injury    Right thumb improving/ 08/08/24 DOI     History of Present Illness: Tammy [name] is a 78 y.o. female who returns for evaluation of right thumb pain.  She injured her right thumb almost 2 months ago, after syncopal episode.  She is no longer bracing her thumb.  It is not getting better.  She has some residual swelling.  She has occasional tenderness.  She is taking Tylenol  at night.    Review of Systems: No fevers or chills No numbness or tingling No chest pain No shortness of breath No bowel or bladder dysfunction No GI distress No headaches    Objective: There were no vitals taken for this visit.  Physical Exam:  General: Alert and oriented. and No acute distress. Gait: Normal gait.  Right thumb demonstrates some swelling.  No bruising.  Sensation intact distally.  She has some tenderness to palpation in the distal phalanx.  Sensation intact distally.  IMAGING: I personally ordered and reviewed the following images  X-rays of the right thumb were obtained in clinic today.  These are compared to available x-rays.  Comminuted fracture of the distal phalanx remains in stable alignment.  There is been no change overall.   There is some surrounding callus formation.  No obvious bony bridging.  No bony lesions.  No dislocation.  Impression: Stable right thumb distal phalanx fracture   New Medications:  No orders of the defined types were placed in this encounter.     Tammy DELENA Horde, MD  10/01/2024 10:21 AM

## 2024-10-02 ENCOUNTER — Encounter (INDEPENDENT_AMBULATORY_CARE_PROVIDER_SITE_OTHER): Payer: Self-pay | Admitting: Gastroenterology

## 2024-10-04 ENCOUNTER — Other Ambulatory Visit: Payer: Self-pay | Admitting: Internal Medicine

## 2024-10-04 ENCOUNTER — Ambulatory Visit: Payer: Self-pay | Admitting: Internal Medicine

## 2024-10-04 MED ORDER — PREDNISONE 10 MG PO TABS: ORAL_TABLET | ORAL | 0 refills | Status: DC

## 2024-10-04 MED ORDER — AZITHROMYCIN 250 MG PO TABS: 250.0000 mg | ORAL_TABLET | ORAL | 0 refills | Status: DC

## 2024-10-04 MED ORDER — PREDNISONE 10 MG PO TABS: ORAL_TABLET | ORAL | 0 refills | Status: AC

## 2024-10-04 MED ORDER — AZITHROMYCIN 250 MG PO TABS: 250.0000 mg | ORAL_TABLET | ORAL | 0 refills | Status: AC

## 2024-10-04 NOTE — Telephone Encounter (Addendum)
 E2C2 Pulmonary Triage - Initial Assessment Questions "Chief Complaint (e.g., cough, sob, wheezing, fever, chills, sweat or additional symptoms) *Go to specific symptom protocol after initial questions. Sob, wheezing, speaking phrases  "How long have symptoms been present?" Week, worse today  Have you tested for COVID or Flu? Note: If not, ask patient if a home test can be taken. If so, instruct patient to call back for positive results. No  MEDICINES:   "Have you used any OTC meds to help with symptoms?" No If yes, ask "What medications?" na  "Have you used your inhalers/maintenance medication?" Yes Inhaler and neb tx   OXYGEN: "Do you wear supplemental oxygen?" No If yes, "How many liters are you supposed to use?" na  "Do you monitor your oxygen levels?" Yes If yes, What is your reading (oxygen level) today? 93 %RA  Patient reports out of Prednisone  and requesting refill, reports was told to call for medication.  No available appts today. Advised ED now.  Copied from CRM [id number]. Topic: Clinical - Red Word Triage >> Oct 04, 2024  9:45 AM Rilla NOVAK wrote: [facility] that prompted transfer to Nurse Triage: Shortness of breath Reason for Disposition  [1] MODERATE difficulty breathing (e.g., speaks in phrases, SOB even at rest, pulse 100-120) AND [2] NEW-onset or WORSE than normal  Answer Assessment - Initial Assessment Questions Advised ED now. Offered to call 911, pt declined. Patient reports will have sister to take to ED.   1. RESPIRATORY STATUS: Describe your breathing? (e.g., wheezing, shortness of breath, unable to speak, severe coughing)      Shortness of breath, wheezing, coughin Out of prednisone , last taken last month  2. ONSET: When did this breathing problem begin?      week 3. PATTERN Does the difficult breathing come and go, or has it been constant since it started?      Constant, worse today 4. SEVERITY: How bad is your breathing? (e.g., mild,  moderate, severe)     Moderate to severe 6. CARDIAC HISTORY: Do you have any history of heart disease? (e.g., heart attack, angina, bypass surgery, angioplasty)      no 7. LUNG HISTORY: Do you have any history of lung disease?  (e.g., pulmonary embolus, asthma, emphysema)     unsure 8. CAUSE: What do you think is causing the breathing problem?      Out of prednisone  9. OTHER SYMPTOMS: Do you have any other symptoms? (e.g., chest pain, cough, dizziness, fever, runny nose)     Denies chest pain, faint/ dizziness, fever, chills, n/v; if I keep doing this I will get to that point 10. O2 SATURATION MONITOR:  Do you use an oxygen saturation monitor (pulse oximeter) at home? If Yes, ask: What is your reading (oxygen level) today? What is your usual oxygen saturation reading? (e.g., 95%)       93% RA Used inhaler, breathing tx; nothing helping Does not use Oxygen 12. TRAVEL: Have you traveled out of the country in the last month? (e.g., travel history, exposures)       no  Protocols used: Breathing Difficulty-A-AH

## 2024-10-04 NOTE — Telephone Encounter (Signed)
 Prefer she go to UC but if declines:  Prednisone  10 mg take  4 each am x 2 days,   2 each am x 2 days,  1 each am x 2 days and stop  Zpak  I called and spoke with the pt  She states she wants to try zpack and pred  I have sent these to her preferred pharm  She is aware to seek emergent care if not improving or is symptoms worsen

## 2024-10-07 DIAGNOSIS — I10 Essential (primary) hypertension: Secondary | ICD-10-CM | POA: Diagnosis not present

## 2024-10-07 DIAGNOSIS — R062 Wheezing: Secondary | ICD-10-CM | POA: Diagnosis not present

## 2024-10-11 DIAGNOSIS — R55 Syncope and collapse: Secondary | ICD-10-CM

## 2024-10-16 ENCOUNTER — Encounter: Payer: Self-pay | Admitting: Allergy & Immunology

## 2024-10-16 ENCOUNTER — Ambulatory Visit (INDEPENDENT_AMBULATORY_CARE_PROVIDER_SITE_OTHER): Admitting: Allergy & Immunology

## 2024-10-16 VITALS — BP 126/74 | HR 73 | Temp 98.1°F | Wt 186.8 lb

## 2024-10-16 DIAGNOSIS — J4540 Moderate persistent asthma, uncomplicated: Secondary | ICD-10-CM

## 2024-10-16 DIAGNOSIS — H1010 Acute atopic conjunctivitis, unspecified eye: Secondary | ICD-10-CM

## 2024-10-16 DIAGNOSIS — J3089 Other allergic rhinitis: Secondary | ICD-10-CM

## 2024-10-16 DIAGNOSIS — H1013 Acute atopic conjunctivitis, bilateral: Secondary | ICD-10-CM | POA: Diagnosis not present

## 2024-10-16 DIAGNOSIS — K219 Gastro-esophageal reflux disease without esophagitis: Secondary | ICD-10-CM

## 2024-10-16 NOTE — Patient Instructions (Addendum)
 1. Moderate persistent asthma, uncomplicated - Lung testing not done today. - We will follow up on that next time. - We are going to add on a nebulizer medication TWICE DAILY in addition to your Pulmicort . - Stop the Dulera  (since you have not had it in a while). - Daily controller medication(s): Pulmicort  (budesonide ) 0.25mg  + Perforomist (formoterol) 20mcg TWICE DAILY via nebulizer  - Rescue medications: albuterol  nebulizer one vial every 4-6 hours as needed - Asthma control goals:  * Full participation in all desired activities (may need albuterol  before activity) * Albuterol  use two time or less a week on average (not counting use with activity) * Cough interfering with sleep two time or less a month * Oral steroids no more than once a year * No hospitalizations  2. 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  - Continue taking: Chlortab and ipratropium nasal spray instead up to three times daily. - Ok to take cetirizine 10mg  TWICE daily during the worse times of the year.  - You can use an extra dose of the antihistamine, if needed, for breakthrough symptoms.  - Consider nasal saline rinses 1-2 times daily to remove allergens from the nasal cavities as well as help with mucous clearance (this is especially helpful to do before the nasal sprays are given)  3. Gastroesophageal reflux disease - Continue with Pepcid  (famotidine ) 40mg  twice daily.   4. Return in about 2 weeks (around 10/30/2024). You can have the follow up appointment with Dr. Iva or a Nurse Practicioner (our Nurse Practitioners are excellent and always have Physician oversight!).    Please inform us  of any Emergency Department visits, hospitalizations, or changes in symptoms. Call us  before going to the ED for breathing or allergy  symptoms since we might be able to fit you in for a sick visit. Feel free to  contact us  anytime with any questions, problems, or concerns.  It was a pleasure to see you again today!  Websites that have reliable patient information: 1. American Academy of Asthma, Allergy , and Immunology: www.aaaai.org 2. Food Allergy  Research and Education (FARE): foodallergy.org 3. Mothers of Asthmatics: http://www.asthmacommunitynetwork.org 4. [employer] of Allergy , Asthma, and Immunology: www.acaai.org      "Like" us  on Facebook and Instagram for our latest updates!      A healthy democracy works best when [employer] participate! Make sure you are registered to vote! If you have moved or changed any of your contact information, you will need to get this updated before voting! Scan the QR codes below to learn more!

## 2024-10-16 NOTE — Progress Notes (Signed)
 FOLLOW UP  Date of Service/Encounter:  10/16/24   Assessment:   Chronic cough - with some improvement on Dulera  (followed with Dr. Darlean in the past)   Mixed rhinitis (trees) - on allergen immunotherapy with moderate improvement in symptoms   Gastroesophageal reflux disease - on famotidine  and pantoprazole    Failed Breztri in the past   Adverse reaction to PPIs (joint plan)  Plan/Recommendations:   1. Moderate persistent asthma, uncomplicated - Lung testing not done today. - We will follow up on that next time. - We are going to add on a nebulizer medication TWICE DAILY in addition to your Pulmicort . - Stop the Dulera  (since you have not had it in a while). - Daily controller medication(s): Pulmicort  (budesonide ) 0.25mg  + Perforomist  (formoterol ) 20mcg TWICE DAILY via nebulizer  - Rescue medications: albuterol  nebulizer one vial every 4-6 hours as needed - Asthma control goals:  * Full participation in all desired activities (may need albuterol  before activity) * Albuterol  use two time or less a week on average (not counting use with activity) * Cough interfering with sleep two time or less a month * Oral steroids no more than once a year * No hospitalizations  2. 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  - Continue taking: Chlortab and ipratropium nasal spray instead up to three times daily. - Ok to take cetirizine 10mg  TWICE daily during the worse times of the year.  - You can use an extra dose of the antihistamine, if needed, for breakthrough symptoms.  - Consider nasal saline rinses 1-2 times daily to remove allergens from the nasal cavities as well as help with mucous clearance (this is especially helpful to do before the nasal sprays are given)  3. Gastroesophageal reflux disease - Continue with Pepcid  (famotidine ) 40mg  twice daily.   4. Return in about  2 weeks (around 10/30/2024). You can have the follow up appointment with Dr. Iva or a Nurse Practicioner (our Nurse Practitioners are excellent and always have Physician oversight!).   Subjective:   Tammy [name] is a 78 y.o. female presenting today for follow up of  Chief Complaint  Patient presents with   Follow-up    Pt is here for her f/up appt. Pt c/o passing out 08/08/24 after using her nebulizer. She f/u with UC 9/12, since has seen ortho due to broken thumb from fall and cardiology, she has had an echo and a sleep study d/t syncope. She also c/o increased allergy  symptoms since monthly injections vs weekly.     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  Discussed the use of AI scribe software for clinical note transcription with the patient and/or guardian, who gave verbal consent to proceed.  Tammy [name] is a 78 y.o. female presenting for a follow up visit.  She was last seen in May  2025.  At that time, lung testing looked great.  We did not make any changes.  We had previously given her Spiriva but she did not feel like it helped at all.  She continue with Dulera  100 mcg 2 puffs twice daily as well as Xopenex .  For her rhinitis, she remained on her allergy  shots which seem to be working well.  We stopped the Flonase  and focused on ipratropium up to 3 times daily.  We continue with the Lortab as needed per Dr. Darlean.  GERD was controlled with Pepcid .  Since last visit, she has done well.  She lives alone and has considered obtaining a life alert system after her recent syncope episode. Her children live away but maintain daily contact. Since the syncope event, she has experienced dizziness and limits her outings to essential trips, such as doctor visits and grocery shopping.  She underwent a cardiac and neurological evaluation following the syncope episode, including a heart monitor and brain studies. She continues to feel some dizziness and is concerned about her overall condition since the incident.    Asthma/Respiratory Symptom History: On September 11th, she experienced a significant episode where she lost her voice and had difficulty breathing, prompting a visit to urgent care. She received a shot and prednisone  for her symptoms. After a breathing treatment at home, she felt hot and subsequently experienced syncope, falling and injuring her hand, which was confirmed to be fractured via x-ray on September 12th.  Her current medication regimen includes a nebulizer treatment twice a day and another medication three times a day. She previously used Dulera  but has not been using it recently. She prefers using a nebulizer over inhalers.  Allergic Rhinitis Symptom History: She is on her allergy  shots. These seem to be going well. She has a history of asthma and allergies, for which she receives allergy  shots. She resumed her shots today after a pause since October 22nd. Weather changes  exacerbate her symptoms, causing nasal congestion and shortness of breath. She is wondering whether she can get her shots every two weeks instead of monthly. This seems to cover her symptoms a bit more effectively.   Tammy [name] is on allergen immunotherapy. She receives one injection. Immunotherapy script #1 contains trees. She currently receives 0.30mL of the RED vial (1/100). She started shots May of 2024 and reached maintenance in December of 2024.  She has not had any large local reactions.  GERD Symptom History:  She remains on Pepcid  (famotidine ) 40mg  twice daily. This seems to be working well to control her symptoms.   Otherwise, there have been no changes to her past medical history, surgical history, family history, or social history.    Review of systems otherwise negative other than that mentioned in the HPI.    Objective:   Blood pressure 126/74, pulse 73, temperature 98.1 F (36.7 C), temperature source Temporal, weight 186 lb 12.8 oz (84.7 kg), SpO2 97%. Body mass index is 35.3 kg/m.    Physical Exam Vitals reviewed.  Constitutional:      Appearance: She is well-developed.     Comments:  Hoarseness.  Pleasant.  HENT:     Head: Normocephalic and atraumatic.     Right Ear: Tympanic membrane, ear canal and external ear normal. No drainage, swelling or tenderness. Tympanic membrane is not injected, scarred, erythematous, retracted or bulging.     Left Ear: Tympanic membrane, ear canal and external ear normal. No drainage, swelling or tenderness. Tympanic membrane is not injected, scarred, erythematous, retracted or bulging.     Nose: Mucosal edema present. No nasal deformity or septal deviation.     Right Turbinates: Enlarged, swollen and pale.     Left Turbinates: Enlarged, swollen and pale.     Right Sinus: No maxillary sinus tenderness or frontal sinus tenderness.     Left Sinus: No maxillary sinus tenderness or frontal sinus tenderness.     Comments: No polyps.      Mouth/Throat:     Mouth: Mucous membranes are not pale and not dry.     Pharynx: Uvula midline.  Eyes:     General:        Right eye: No discharge.        Left eye: No discharge.     Conjunctiva/sclera: Conjunctivae normal.     Right eye: Right conjunctiva is not injected. No chemosis.    Left eye: Left conjunctiva is not injected. No chemosis.    Pupils: Pupils are equal, round, and reactive to light.  Cardiovascular:     Rate and Rhythm: Normal rate and regular rhythm.     Heart sounds: Normal heart sounds.  Pulmonary:     Effort: Pulmonary effort is normal. No tachypnea, accessory muscle usage or respiratory distress.     Breath sounds: Normal breath sounds. No transmitted upper airway sounds. No wheezing, rhonchi or rales.     Comments: Moving air well in all lung fields. No increased work of breathing noted.  Chest:     Chest wall: No tenderness.  Abdominal:     Tenderness: There is no abdominal tenderness. There is no guarding or rebound.  Lymphadenopathy:     Head:     Right side of head: No submandibular, tonsillar or occipital adenopathy.     Left side of head: No submandibular, tonsillar or occipital adenopathy.     Cervical: No cervical adenopathy.  Skin:    Coloration: Skin is not pale.     Findings: No abrasion, erythema, petechiae or rash. Rash is not papular, urticarial or vesicular.  Neurological:     Mental Status: She is alert.  Psychiatric:        Behavior: Behavior is cooperative.      Diagnostic studies: none     Marty Shaggy, MD  [facility] 

## 2024-10-18 ENCOUNTER — Encounter: Payer: Self-pay | Admitting: Allergy & Immunology

## 2024-10-18 ENCOUNTER — Other Ambulatory Visit: Payer: Self-pay | Admitting: Allergy & Immunology

## 2024-10-18 DIAGNOSIS — J4540 Moderate persistent asthma, uncomplicated: Secondary | ICD-10-CM

## 2024-10-18 MED ORDER — FORMOTEROL FUMARATE 20 MCG/2ML IN NEBU: 20.0000 ug | INHALATION_SOLUTION | Freq: Two times a day (BID) | RESPIRATORY_TRACT | 5 refills | Status: DC

## 2024-10-21 ENCOUNTER — Telehealth: Payer: Self-pay | Admitting: Internal Medicine

## 2024-10-21 ENCOUNTER — Telehealth: Payer: Self-pay

## 2024-10-21 NOTE — Telephone Encounter (Signed)
-----   Message from [name] Tammy [name] sent at 10/18/2024  4:20 PM EST ----- Can you mark as EVERY TWO WEEKS on her flowsheet? She feels like this works better.

## 2024-10-21 NOTE — Telephone Encounter (Signed)
 The patient has been notified of the result and verbalized understanding.  All questions (if any) were answered. Rosina JAYSON Cornea, CMA 10/21/2024 3:59 PM

## 2024-10-21 NOTE — Telephone Encounter (Signed)
Flow sheet has been updated.

## 2024-10-21 NOTE — Telephone Encounter (Signed)
-----   Message from Vishnu P Mallipeddi sent at 10/21/2024  3:03 PM EST ----- 5.8% PAC burden and 24 runs of non-sustained pSVT. Asymptomatic. Syncope not explained by event monitor findings. ----- Message ----- From: Mallipeddi, Vishnu P, MD Sent: 10/11/2024   4:20 PM EST To: Vishnu P Mallipeddi, MD

## 2024-10-21 NOTE — Telephone Encounter (Signed)
 Patient is returning phone call in regard to results.

## 2024-10-29 ENCOUNTER — Ambulatory Visit: Admitting: Orthopedic Surgery

## 2024-10-30 ENCOUNTER — Ambulatory Visit

## 2024-10-30 DIAGNOSIS — J302 Other seasonal allergic rhinitis: Secondary | ICD-10-CM

## 2024-10-30 DIAGNOSIS — J309 Allergic rhinitis, unspecified: Secondary | ICD-10-CM

## 2024-11-01 ENCOUNTER — Ambulatory Visit: Admitting: Family Medicine

## 2024-11-05 ENCOUNTER — Ambulatory Visit: Admitting: Orthopedic Surgery

## 2024-11-05 NOTE — Progress Notes (Unsigned)
   [address] [place] [address] Dept: [phone]  FOLLOW UP NOTE  Patient ID: Tammy [name] Kitty, female    DOB: 07/11/1946  Age: 78 y.o. MRN: [id number] Date of Office Visit: 11/06/2024  Assessment  Chief Complaint: No chief complaint on file.  HPI AMAL RENBARGER is an 78 year old female who presents to the clinic for follow-up visit.  She was last seen in this clinic on 10/16/2024 by Dr. Iva for evaluation of cough, allergic rhinitis on allergen immunotherapy, and reflux.  Discussed the use of AI scribe software for clinical note transcription with the patient, who gave verbal consent to proceed.  History of Present Illness      Drug Allergies:  No Known Allergies  Physical Exam: There were no vitals taken for this visit.   Physical Exam  Diagnostics:    Assessment and Plan: No diagnosis found.  No orders of the defined types were placed in this encounter.   There are no Patient Instructions on file for this visit.  No follow-ups on file.    Thank you for the opportunity to care for this patient.  Please do not hesitate to contact me with questions.  Arlean Mutter, FNP [facility]

## 2024-11-05 NOTE — Patient Instructions (Incomplete)
 1. Moderate persistent asthma, uncomplicated - Lung testing not done today. - We will follow up on that next time. - We are going to add on a nebulizer medication TWICE DAILY in addition to your Pulmicort . - Stop the Dulera  (since you have not had it in a while). - Daily controller medication(s): Pulmicort  (budesonide ) 0.25mg  + Perforomist  (formoterol ) 20mcg TWICE DAILY via nebulizer  - Rescue medications: albuterol  nebulizer one vial every 4-6 hours as needed - Asthma control goals:  * Full participation in all desired activities (may need albuterol  before activity) * Albuterol  use two time or less a week on average (not counting use with activity) * Cough interfering with sleep two time or less a month * Oral steroids no more than once a year * No hospitalizations  2. 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  - Continue taking: Chlortab and ipratropium nasal spray instead up to three times daily. - Ok to take cetirizine 10mg  TWICE daily during the worse times of the year.  - You can use an extra dose of the antihistamine, if needed, for breakthrough symptoms.  - Consider nasal saline rinses 1-2 times daily to remove allergens from the nasal cavities as well as help with mucous clearance (this is especially helpful to do before the nasal sprays are given)  3. Gastroesophageal reflux disease - Continue with Pepcid  (famotidine ) 40mg  twice daily.   4. No follow-ups on file. You can have the follow up appointment with Dr. Iva or a Nurse Practicioner (our Nurse Practitioners are excellent and always have Physician oversight!).    Please inform us  of any Emergency Department visits, hospitalizations, or changes in symptoms. Call us  before going to the ED for breathing or allergy  symptoms since we might be able to fit you in for a sick visit. Feel free to contact us  anytime with  any questions, problems, or concerns.  It was a pleasure to see you again today!  Websites that have reliable patient information: 1. American Academy of Asthma, Allergy , and Immunology: www.aaaai.org 2. Food Allergy  Research and Education (FARE): foodallergy.org 3. Mothers of Asthmatics: http://www.asthmacommunitynetwork.org 4. [employer] of Allergy , Asthma, and Immunology: www.acaai.org

## 2024-11-06 ENCOUNTER — Ambulatory Visit

## 2024-11-06 ENCOUNTER — Encounter: Payer: Self-pay | Admitting: Family Medicine

## 2024-11-06 ENCOUNTER — Ambulatory Visit: Admitting: Family Medicine

## 2024-11-06 VITALS — BP 112/66 | HR 79 | Temp 97.3°F | Ht 63.0 in | Wt 190.0 lb

## 2024-11-06 DIAGNOSIS — K219 Gastro-esophageal reflux disease without esophagitis: Secondary | ICD-10-CM | POA: Diagnosis not present

## 2024-11-06 DIAGNOSIS — J301 Allergic rhinitis due to pollen: Secondary | ICD-10-CM | POA: Diagnosis not present

## 2024-11-06 DIAGNOSIS — J4540 Moderate persistent asthma, uncomplicated: Secondary | ICD-10-CM

## 2024-11-12 ENCOUNTER — Encounter: Payer: Self-pay | Admitting: Orthopedic Surgery

## 2024-11-12 ENCOUNTER — Other Ambulatory Visit: Payer: Self-pay

## 2024-11-12 ENCOUNTER — Ambulatory Visit: Admitting: Orthopedic Surgery

## 2024-11-12 DIAGNOSIS — S62524D Nondisplaced fracture of distal phalanx of right thumb, subsequent encounter for fracture with routine healing: Secondary | ICD-10-CM

## 2024-11-12 NOTE — Progress Notes (Signed)
 Return patient Visit  Assessment: Tammy [name] is a 78 y.o. female with the following: 1. Closed nondisplaced fracture of distal phalanx of right thumb, subsequent encounter  Plan: Gabriel ONEIDA Kitty sustained a fracture of the distal phalanx of the right thumb.  Injury was almost 3 months ago.  She feels better.  Radiographs demonstrates some extension to the fracture site.  There is callus ration.  Fracture line is still visible.  On exam, she has some residual swelling and some remaining tenderness.  Anticipate this will continue to improve.  She has no restrictions at this time.  She is comfortable with her improvement thus far.  She will return to clinic as needed.  Follow-up: Return if symptoms worsen or fail to improve.  Subjective:  Chief Complaint  Patient presents with   Fracture    R thumb DOI  08/08/24    History of Present Illness: Tammy [name] is a 78 y.o. female who returns for evaluation of right thumb pain.  She injured her right thumb 3 months ago, after syncopal episode.  She notes some occasional tenderness.  She denies any medicines at this time.  She has some restricted motion at the IP joint.  No complaints.  She is happy with her improvement.    Review of Systems: No fevers or chills No numbness or tingling No chest pain No shortness of breath No bowel or bladder dysfunction No GI distress No headaches    Objective: There were no vitals taken for this visit.  Physical Exam:  General: Alert and oriented. and No acute distress. Gait: Normal gait.  Right thumb with some mild residual swelling.  This is diffuse.  Restricted IP flexion.  Thumb is warm and well-perfused.  Sensation intact throughout the right thumb.  IMAGING: I personally ordered and reviewed the following images  X-rays of the right thumb were obtained in clinic today.  These are compared to prior..  Comminuted fracture of the distal phalanx is in unchanged alignment.  There is  callus formation.  Fracture lines remain visible.  No intra-articular extension.  Mild extension through the fracture site.  Impression: Stable right thumb distal phalanx fracture   New Medications:  No orders of the defined types were placed in this encounter.     Oneil DELENA Horde, MD  11/12/2024 1:12 PM

## 2024-11-13 ENCOUNTER — Ambulatory Visit (INDEPENDENT_AMBULATORY_CARE_PROVIDER_SITE_OTHER)

## 2024-11-13 DIAGNOSIS — J301 Allergic rhinitis due to pollen: Secondary | ICD-10-CM

## 2024-11-27 ENCOUNTER — Ambulatory Visit

## 2024-11-27 DIAGNOSIS — J302 Other seasonal allergic rhinitis: Secondary | ICD-10-CM | POA: Diagnosis not present

## 2024-11-27 DIAGNOSIS — J309 Allergic rhinitis, unspecified: Secondary | ICD-10-CM

## 2024-11-30 ENCOUNTER — Other Ambulatory Visit: Payer: Self-pay | Admitting: Internal Medicine

## 2024-11-30 DIAGNOSIS — R058 Other specified cough: Secondary | ICD-10-CM

## 2024-12-09 DIAGNOSIS — J301 Allergic rhinitis due to pollen: Secondary | ICD-10-CM | POA: Diagnosis not present

## 2024-12-09 NOTE — Progress Notes (Signed)
 VIALS MADE ON 12/09/24

## 2024-12-11 ENCOUNTER — Ambulatory Visit (INDEPENDENT_AMBULATORY_CARE_PROVIDER_SITE_OTHER)

## 2024-12-11 DIAGNOSIS — J302 Other seasonal allergic rhinitis: Secondary | ICD-10-CM

## 2025-02-07 ENCOUNTER — Ambulatory Visit: Admitting: Family Medicine
